# Patient Record
Sex: Female | Born: 1960 | Race: White | Hispanic: No | Marital: Married | Smoking: Never smoker
Health system: Southern US, Community
[De-identification: ages and names within clinical notes are randomized; demographics above are authoritative.]

## PROBLEM LIST (undated history)

## (undated) DIAGNOSIS — Z803 Family history of malignant neoplasm of breast: Secondary | ICD-10-CM

## (undated) DIAGNOSIS — K219 Gastro-esophageal reflux disease without esophagitis: Secondary | ICD-10-CM

## (undated) DIAGNOSIS — T4145XA Adverse effect of unspecified anesthetic, initial encounter: Secondary | ICD-10-CM

## (undated) DIAGNOSIS — Z9889 Other specified postprocedural states: Secondary | ICD-10-CM

## (undated) DIAGNOSIS — Z973 Presence of spectacles and contact lenses: Secondary | ICD-10-CM

## (undated) DIAGNOSIS — R112 Nausea with vomiting, unspecified: Secondary | ICD-10-CM

## (undated) DIAGNOSIS — M1990 Unspecified osteoarthritis, unspecified site: Secondary | ICD-10-CM

## (undated) DIAGNOSIS — K635 Polyp of colon: Secondary | ICD-10-CM

## (undated) DIAGNOSIS — K760 Fatty (change of) liver, not elsewhere classified: Secondary | ICD-10-CM

## (undated) DIAGNOSIS — I341 Nonrheumatic mitral (valve) prolapse: Secondary | ICD-10-CM

## (undated) DIAGNOSIS — T7840XA Allergy, unspecified, initial encounter: Secondary | ICD-10-CM

## (undated) DIAGNOSIS — J45909 Unspecified asthma, uncomplicated: Secondary | ICD-10-CM

## (undated) DIAGNOSIS — Z1371 Encounter for nonprocreative screening for genetic disease carrier status: Secondary | ICD-10-CM

## (undated) DIAGNOSIS — T8859XA Other complications of anesthesia, initial encounter: Secondary | ICD-10-CM

## (undated) DIAGNOSIS — Z9189 Other specified personal risk factors, not elsewhere classified: Secondary | ICD-10-CM

## (undated) DIAGNOSIS — IMO0002 Reserved for concepts with insufficient information to code with codable children: Secondary | ICD-10-CM

## (undated) DIAGNOSIS — B019 Varicella without complication: Secondary | ICD-10-CM

## (undated) DIAGNOSIS — N390 Urinary tract infection, site not specified: Secondary | ICD-10-CM

## (undated) DIAGNOSIS — B029 Zoster without complications: Secondary | ICD-10-CM

## (undated) DIAGNOSIS — Z8489 Family history of other specified conditions: Secondary | ICD-10-CM

## (undated) HISTORY — DX: Nonrheumatic mitral (valve) prolapse: I34.1

## (undated) HISTORY — DX: Allergy, unspecified, initial encounter: T78.40XA

## (undated) HISTORY — DX: Unspecified osteoarthritis, unspecified site: M19.90

## (undated) HISTORY — PX: OTHER SURGICAL HISTORY: SHX169

## (undated) HISTORY — DX: Unspecified asthma, uncomplicated: J45.909

## (undated) HISTORY — DX: Urinary tract infection, site not specified: N39.0

## (undated) HISTORY — PX: BREAST BIOPSY: SHX20

## (undated) HISTORY — DX: Reserved for concepts with insufficient information to code with codable children: IMO0002

## (undated) HISTORY — DX: Fatty (change of) liver, not elsewhere classified: K76.0

## (undated) HISTORY — DX: Family history of malignant neoplasm of breast: Z80.3

## (undated) HISTORY — PX: OOPHORECTOMY: SHX86

## (undated) HISTORY — DX: Zoster without complications: B02.9

## (undated) HISTORY — DX: Polyp of colon: K63.5

## (undated) HISTORY — DX: Varicella without complication: B01.9

## (undated) HISTORY — DX: Gastro-esophageal reflux disease without esophagitis: K21.9

---
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
## 1987-02-22 DIAGNOSIS — IMO0002 Reserved for concepts with insufficient information to code with codable children: Secondary | ICD-10-CM

## 1987-02-22 DIAGNOSIS — R87619 Unspecified abnormal cytological findings in specimens from cervix uteri: Secondary | ICD-10-CM

## 1987-02-22 HISTORY — DX: Unspecified abnormal cytological findings in specimens from cervix uteri: R87.619

## 1987-02-22 HISTORY — DX: Reserved for concepts with insufficient information to code with codable children: IMO0002

## 2004-02-05 ENCOUNTER — Ambulatory Visit: Payer: Self-pay | Admitting: Unknown Physician Specialty

## 2004-07-08 ENCOUNTER — Ambulatory Visit: Payer: Self-pay | Admitting: Unknown Physician Specialty

## 2004-07-21 ENCOUNTER — Ambulatory Visit: Payer: Self-pay | Admitting: Obstetrics and Gynecology

## 2004-07-22 ENCOUNTER — Ambulatory Visit: Payer: Self-pay | Admitting: Obstetrics and Gynecology

## 2004-08-02 ENCOUNTER — Ambulatory Visit: Payer: Self-pay | Admitting: Obstetrics and Gynecology

## 2004-09-27 ENCOUNTER — Ambulatory Visit: Payer: Self-pay | Admitting: Obstetrics and Gynecology

## 2005-01-28 ENCOUNTER — Ambulatory Visit: Payer: Self-pay | Admitting: Family Medicine

## 2005-02-15 ENCOUNTER — Ambulatory Visit: Payer: Self-pay | Admitting: Internal Medicine

## 2005-12-05 ENCOUNTER — Ambulatory Visit: Payer: Self-pay

## 2006-02-21 HISTORY — PX: ABDOMINAL HYSTERECTOMY: SHX81

## 2006-02-21 HISTORY — PX: TONSILLECTOMY AND ADENOIDECTOMY: SUR1326

## 2006-06-30 ENCOUNTER — Ambulatory Visit: Payer: Self-pay | Admitting: Unknown Physician Specialty

## 2006-07-08 ENCOUNTER — Ambulatory Visit: Payer: Self-pay | Admitting: Unknown Physician Specialty

## 2006-09-01 ENCOUNTER — Ambulatory Visit: Payer: Self-pay | Admitting: Unknown Physician Specialty

## 2006-09-05 ENCOUNTER — Ambulatory Visit: Payer: Self-pay | Admitting: Unknown Physician Specialty

## 2006-09-22 ENCOUNTER — Ambulatory Visit: Payer: Self-pay | Admitting: Oncology

## 2006-10-16 ENCOUNTER — Ambulatory Visit: Payer: Self-pay | Admitting: Oncology

## 2006-10-20 ENCOUNTER — Ambulatory Visit: Payer: Self-pay | Admitting: Unknown Physician Specialty

## 2006-10-23 ENCOUNTER — Ambulatory Visit: Payer: Self-pay | Admitting: Oncology

## 2006-12-28 ENCOUNTER — Ambulatory Visit: Payer: Self-pay

## 2008-03-05 ENCOUNTER — Ambulatory Visit: Payer: Self-pay | Admitting: Family Medicine

## 2008-03-11 ENCOUNTER — Ambulatory Visit: Payer: Self-pay | Admitting: Family Medicine

## 2008-04-03 ENCOUNTER — Ambulatory Visit: Payer: Self-pay | Admitting: Unknown Physician Specialty

## 2008-04-16 ENCOUNTER — Ambulatory Visit: Payer: Self-pay | Admitting: Family Medicine

## 2008-04-21 ENCOUNTER — Ambulatory Visit: Payer: Self-pay | Admitting: Family Medicine

## 2008-05-22 ENCOUNTER — Ambulatory Visit: Payer: Self-pay | Admitting: Family Medicine

## 2009-02-10 ENCOUNTER — Ambulatory Visit: Payer: Self-pay | Admitting: Unknown Physician Specialty

## 2009-03-11 ENCOUNTER — Ambulatory Visit: Payer: Self-pay | Admitting: Family Medicine

## 2009-03-20 ENCOUNTER — Ambulatory Visit: Payer: Self-pay | Admitting: Family Medicine

## 2009-05-21 ENCOUNTER — Ambulatory Visit: Payer: Self-pay | Admitting: Neurology

## 2009-06-13 ENCOUNTER — Ambulatory Visit: Payer: Self-pay | Admitting: Neurology

## 2009-06-23 ENCOUNTER — Ambulatory Visit: Payer: Self-pay | Admitting: Unknown Physician Specialty

## 2010-04-28 ENCOUNTER — Other Ambulatory Visit: Payer: Self-pay | Admitting: General Practice

## 2010-06-02 ENCOUNTER — Other Ambulatory Visit: Payer: Self-pay | Admitting: Unknown Physician Specialty

## 2010-06-02 ENCOUNTER — Ambulatory Visit: Payer: Self-pay | Admitting: Unknown Physician Specialty

## 2010-07-20 ENCOUNTER — Emergency Department: Payer: Self-pay | Admitting: Emergency Medicine

## 2010-07-23 ENCOUNTER — Ambulatory Visit: Payer: Self-pay | Admitting: Specialist

## 2010-08-24 ENCOUNTER — Encounter: Payer: Self-pay | Admitting: Orthopedic Surgery

## 2010-09-22 ENCOUNTER — Encounter: Payer: Self-pay | Admitting: Orthopedic Surgery

## 2010-10-23 ENCOUNTER — Encounter: Payer: Self-pay | Admitting: Orthopedic Surgery

## 2010-11-06 ENCOUNTER — Ambulatory Visit: Payer: Self-pay | Admitting: Orthopedic Surgery

## 2010-11-22 ENCOUNTER — Encounter: Payer: Self-pay | Admitting: Orthopedic Surgery

## 2010-12-23 ENCOUNTER — Encounter: Payer: Self-pay | Admitting: Orthopedic Surgery

## 2011-01-11 ENCOUNTER — Ambulatory Visit: Payer: Self-pay | Admitting: Anesthesiology

## 2011-01-22 ENCOUNTER — Encounter: Payer: Self-pay | Admitting: Orthopedic Surgery

## 2011-02-01 ENCOUNTER — Ambulatory Visit: Payer: Self-pay | Admitting: Orthopedic Surgery

## 2011-02-17 ENCOUNTER — Ambulatory Visit: Payer: Self-pay | Admitting: Unknown Physician Specialty

## 2011-02-20 LAB — HM PAP SMEAR: HM Pap smear: NORMAL

## 2011-02-22 ENCOUNTER — Encounter: Payer: Self-pay | Admitting: Orthopedic Surgery

## 2011-03-24 ENCOUNTER — Ambulatory Visit: Payer: Self-pay | Admitting: Unknown Physician Specialty

## 2011-06-03 ENCOUNTER — Ambulatory Visit: Payer: Self-pay | Admitting: Unknown Physician Specialty

## 2011-06-21 LAB — HM COLONOSCOPY

## 2011-06-22 HISTORY — PX: OTHER SURGICAL HISTORY: SHX169

## 2011-10-17 ENCOUNTER — Ambulatory Visit: Payer: Self-pay | Admitting: Internal Medicine

## 2011-10-21 ENCOUNTER — Ambulatory Visit: Payer: Self-pay | Admitting: Internal Medicine

## 2011-10-21 ENCOUNTER — Ambulatory Visit (INDEPENDENT_AMBULATORY_CARE_PROVIDER_SITE_OTHER): Payer: PRIVATE HEALTH INSURANCE | Admitting: Internal Medicine

## 2011-10-21 ENCOUNTER — Encounter: Payer: Self-pay | Admitting: Internal Medicine

## 2011-10-21 ENCOUNTER — Telehealth: Payer: Self-pay | Admitting: Internal Medicine

## 2011-10-21 VITALS — BP 122/82 | HR 73 | Temp 98.2°F | Ht 67.0 in | Wt 149.0 lb

## 2011-10-21 DIAGNOSIS — J302 Other seasonal allergic rhinitis: Secondary | ICD-10-CM | POA: Insufficient documentation

## 2011-10-21 DIAGNOSIS — R200 Anesthesia of skin: Secondary | ICD-10-CM

## 2011-10-21 DIAGNOSIS — R209 Unspecified disturbances of skin sensation: Secondary | ICD-10-CM

## 2011-10-21 DIAGNOSIS — J309 Allergic rhinitis, unspecified: Secondary | ICD-10-CM

## 2011-10-21 DIAGNOSIS — Z0000 Encounter for general adult medical examination without abnormal findings: Secondary | ICD-10-CM

## 2011-10-21 LAB — LIPID PANEL
Cholesterol: 178 mg/dL (ref 0–200)
HDL: 57 mg/dL (ref >39.00)
LDL Cholesterol: 102 mg/dL — ABNORMAL HIGH (ref 0–99)
Total CHOL/HDL Ratio: 3
Triglycerides: 94 mg/dL (ref 0.0–149.0)

## 2011-10-21 LAB — CBC WITH DIFFERENTIAL/PLATELET
Basophils Relative: 0.9 % (ref 0.0–3.0)
Eosinophils Relative: 4 % (ref 0.0–5.0)
Hemoglobin: 14.5 g/dL (ref 12.0–15.0)
Lymphocytes Relative: 27.7 % (ref 12.0–46.0)
Monocytes Relative: 9.1 % (ref 3.0–12.0)
Neutro Abs: 3.3 10*3/uL (ref 1.4–7.7)
Neutrophils Relative %: 58.3 % (ref 43.0–77.0)
RBC: 4.64 Mil/uL (ref 3.87–5.11)
WBC: 5.7 10*3/uL (ref 4.5–10.5)

## 2011-10-21 LAB — COMPREHENSIVE METABOLIC PANEL
ALT: 15 U/L (ref 0–35)
AST: 17 U/L (ref 0–37)
Alkaline Phosphatase: 83 U/L (ref 39–117)
Creatinine, Ser: 0.7 mg/dL (ref 0.4–1.2)
Sodium: 137 mEq/L (ref 135–145)
Total Bilirubin: 0.3 mg/dL (ref 0.3–1.2)
Total Protein: 7.3 g/dL (ref 6.0–8.3)

## 2011-10-21 MED ORDER — LORATADINE-PSEUDOEPHEDRINE ER 5-120 MG PO TB12: 1.0000 | ORAL_TABLET | Freq: Two times a day (BID) | ORAL | Status: AC

## 2011-10-21 NOTE — Telephone Encounter (Signed)
Patient notified

## 2011-10-21 NOTE — Progress Notes (Signed)
Subjective:    Patient ID: Ashley Contreras, female    DOB: 1961-02-01, 51 y.o.   MRN: [id number]  HPI 51 year old female presents to establish care. Her primary concern today is 2 episodes of left-sided facial numbness. She reports that these episodes both occurred approximately one month ago. She describes numbness in her left lower face extending from her cheek down to her jaw. The symptoms persisted for several hours and then subsided. There is no obvious trigger to the symptoms. There was no associated pain. She did not have weakness in her chart difficulty chewing or swallowing. She did not have any other focal neurologic symptoms. She denies headache, fever, chills, neck stiffness. She was not taking any new medications except for occasional oxycodone postoperatively after her foot surgery.  She notes that she recent had foot surgery to repair a calcaneal fracture in her right foot which she sustained after a fall from a ladder. She reports significant pain postoperatively. She is currently using nonsteroidals as needed for pain. She is trying to adapt to a regular tennis shoe but having some difficulty with ambulation. She continues to follow with her surgeon at [facility]. Next  She also notes a history of seasonal allergies for which she takes Claritin-D. She reports improvement in her symptoms of nasal drainage and bilateral ear pressure and itching with use of medication.  Outpatient Encounter Prescriptions as of 10/21/2011  Medication Sig Dispense Refill  . acetaminophen (TYLENOL) 500 MG tablet Take 500 mg by mouth every 4 (four) hours as needed.      . Calcium Carbonate-Vitamin D (CALCIUM-VITAMIN D) 500-200 MG-UNIT per tablet Take 1 tablet by mouth 2 (two) times daily with a meal.      . estradiol (VIVELLE-DOT) 0.05 MG/24HR Place 1 patch onto the skin 2 (two) times a week.      . loratadine-pseudoephedrine (CLARITIN-D 12-HOUR) 5-120 MG per tablet Take 1 tablet by mouth 2 (two) times  daily.  30 tablet  3  . ranitidine (ZANTAC) 150 MG tablet Take 150 mg by mouth 2 (two) times daily.      Marland Kitchen DISCONTD: loratadine-pseudoephedrine (CLARITIN-D 24-HOUR) 10-240 MG per 24 hr tablet Take 1 tablet by mouth daily.        Review of Systems  Constitutional: Negative for fever, chills, appetite change, fatigue and unexpected weight change.  HENT: Positive for congestion. Negative for ear pain, sore throat, trouble swallowing, neck pain, voice change and sinus pressure.   Eyes: Negative for visual disturbance.  Respiratory: Negative for cough, shortness of breath, wheezing and stridor.   Cardiovascular: Negative for chest pain, palpitations and leg swelling.  Gastrointestinal: Negative for nausea, vomiting, abdominal pain, diarrhea, constipation, blood in stool, abdominal distention and anal bleeding.  Genitourinary: Negative for dysuria and flank pain.  Musculoskeletal: Negative for myalgias, arthralgias and gait problem.  Skin: Negative for color change and rash.  Neurological: Positive for numbness. Negative for dizziness, tremors, seizures, syncope, speech difficulty, weakness, light-headedness and headaches.  Hematological: Negative for adenopathy. Does not bruise/bleed easily.  Psychiatric/Behavioral: Negative for suicidal ideas, disturbed wake/sleep cycle and dysphoric mood. The patient is not nervous/anxious.        Objective:   Physical Exam  Constitutional: She is oriented to person, place, and time. She appears well-developed and well-nourished. No distress.  HENT:  Head: Normocephalic and atraumatic.  Right Ear: External ear normal.  Left Ear: External ear normal.  Nose: Nose normal.  Mouth/Throat: Oropharynx is clear and moist. No oropharyngeal exudate.  Eyes: Conjunctivae  are normal. Pupils are equal, round, and reactive to light. Right eye exhibits no discharge. Left eye exhibits no discharge. No scleral icterus.  Neck: Normal range of motion. Neck supple. No  tracheal tenderness present. Carotid bruit is not present. No tracheal deviation, no edema and no erythema present. No mass and no thyromegaly present.  Cardiovascular: Normal rate, regular rhythm, normal heart sounds and intact distal pulses.  Exam reveals no gallop and no friction rub.   No murmur heard. Pulmonary/Chest: Effort normal and breath sounds normal. No respiratory distress. She has no wheezes. She has no rales. She exhibits no tenderness.  Abdominal: Soft. Bowel sounds are normal. She exhibits no distension. There is no tenderness.  Musculoskeletal: Normal range of motion. She exhibits no edema and no tenderness.  Lymphadenopathy:    She has no cervical adenopathy.  Neurological: She is alert and oriented to person, place, and time. No cranial nerve deficit. She exhibits normal muscle tone. Coordination normal.  Skin: Skin is warm and dry. No rash noted. She is not diaphoretic. No erythema. No pallor.  Psychiatric: She has a normal mood and affect. Her behavior is normal. Judgment and thought content normal.          Assessment & Plan:

## 2011-10-21 NOTE — Telephone Encounter (Signed)
CT head was normal

## 2011-10-21 NOTE — Assessment & Plan Note (Addendum)
Unclear etiology of intermittent left facial numbness. No symptoms to suggest trigeminal neuralgia or Bell's palsy. Patient is very concerned about possibility of TIA or stroke. Based on history, her risk of stroke would be low with no history of hyperlipidemia, smoking, or family history of stroke. However, given persistent symptoms, will proceed with further evaluation including CT of the brain. Will also check a lipid profile with labs today. Followup 2 weeks. She will call sooner if any recurrent symptoms.

## 2011-10-21 NOTE — Assessment & Plan Note (Signed)
Symptoms well controlled with Claritin-D. Will continue.

## 2011-11-01 ENCOUNTER — Encounter: Payer: Self-pay | Admitting: Internal Medicine

## 2011-11-03 ENCOUNTER — Ambulatory Visit (INDEPENDENT_AMBULATORY_CARE_PROVIDER_SITE_OTHER): Payer: PRIVATE HEALTH INSURANCE | Admitting: Internal Medicine

## 2011-11-03 ENCOUNTER — Encounter: Payer: Self-pay | Admitting: Internal Medicine

## 2011-11-03 VITALS — BP 102/70 | HR 90 | Temp 98.0°F | Ht 67.0 in | Wt 149.8 lb

## 2011-11-03 DIAGNOSIS — M5412 Radiculopathy, cervical region: Secondary | ICD-10-CM

## 2011-11-03 DIAGNOSIS — R209 Unspecified disturbances of skin sensation: Secondary | ICD-10-CM

## 2011-11-03 DIAGNOSIS — R200 Anesthesia of skin: Secondary | ICD-10-CM

## 2011-11-03 NOTE — Assessment & Plan Note (Signed)
Symptoms have completely resolved. CT brain was normal. Will continue to monitor. If any recurrent symptoms, would favor neurology evaluation.

## 2011-11-03 NOTE — Assessment & Plan Note (Signed)
Symptoms are consistent with cervical radiculopathy. Given that her last MRI was several years ago and symptoms are worsening, will plan to repeat MRI of the cervical spine. We discussed potential referral to neurosurgery pending results.

## 2011-11-03 NOTE — Progress Notes (Signed)
Subjective:    Patient ID: Ashley Contreras, female    DOB: 12-17-1960, 51 y.o.   MRN: [id number]  HPI 51 year old female presents for followup. At her previous visit she was evaluated for episodes of tingling on the left side of her face. She ultimately underwent CT of the head which was normal. She reports no further recurrence of these episodes.  She is primarily concerned today about two-year history of intermittent pain in her cervical spine which occasionally radiates down her left arm. She notes she was evaluated for this before with plain x-ray and then with MRI which showed degenerative changes and bulging disc at C3 and C4. She ultimately underwent conservative management. Symptoms improved however recently have seemed to worsen with more frequent pain radiating down her left arm. She denies any weakness in her arm or numbness in her arm. She denies any other focal symptoms. She has not taken any medication for this.  Outpatient Encounter Prescriptions as of 11/03/2011  Medication Sig Dispense Refill  . acetaminophen (TYLENOL) 500 MG tablet Take 500 mg by mouth every 4 (four) hours as needed.      . Calcium Carbonate-Vitamin D (CALCIUM-VITAMIN D) 500-200 MG-UNIT per tablet Take 1 tablet by mouth 2 (two) times daily with a meal.      . estradiol (VIVELLE-DOT) 0.05 MG/24HR Place 1 patch onto the skin 2 (two) times a week.      . loratadine-pseudoephedrine (CLARITIN-D 12-HOUR) 5-120 MG per tablet Take 1 tablet by mouth 2 (two) times daily.  30 tablet  3  . ranitidine (ZANTAC) 150 MG tablet Take 150 mg by mouth 2 (two) times daily.       BP 102/70  Pulse 90  Temp 98 F (36.7 C) (Oral)  Ht 5\' 7"  (1.702 m)  Wt 149 lb 12 oz (67.926 kg)  BMI 23.45 kg/m2  SpO2 99%  Review of Systems  Constitutional: Negative for fever, chills, appetite change, fatigue and unexpected weight change.  HENT: Negative for ear pain, congestion, sore throat, trouble swallowing, neck pain, voice change and sinus  pressure.   Eyes: Negative for visual disturbance.  Respiratory: Negative for cough, shortness of breath, wheezing and stridor.   Cardiovascular: Negative for chest pain, palpitations and leg swelling.  Gastrointestinal: Negative for nausea, vomiting, abdominal pain, diarrhea, constipation, blood in stool, abdominal distention and anal bleeding.  Genitourinary: Negative for dysuria and flank pain.  Musculoskeletal: Positive for myalgias and back pain. Negative for arthralgias and gait problem.  Skin: Negative for color change and rash.  Neurological: Negative for dizziness and headaches.  Hematological: Negative for adenopathy. Does not bruise/bleed easily.  Psychiatric/Behavioral: Negative for suicidal ideas, disturbed wake/sleep cycle and dysphoric mood. The patient is not nervous/anxious.        Objective:   Physical Exam  Constitutional: She is oriented to person, place, and time. She appears well-developed and well-nourished. No distress.  HENT:  Head: Normocephalic and atraumatic.  Right Ear: External ear normal.  Left Ear: External ear normal.  Nose: Nose normal.  Mouth/Throat: Oropharynx is clear and moist. No oropharyngeal exudate.  Eyes: Conjunctivae normal are normal. Pupils are equal, round, and reactive to light. Right eye exhibits no discharge. Left eye exhibits no discharge. No scleral icterus.  Neck: Normal range of motion. Neck supple. No tracheal deviation present. No thyromegaly present.  Cardiovascular: Normal rate, regular rhythm, normal heart sounds and intact distal pulses.  Exam reveals no gallop and no friction rub.   No murmur heard. Pulmonary/Chest: Effort normal  and breath sounds normal. No respiratory distress. She has no wheezes. She has no rales. She exhibits no tenderness.  Musculoskeletal: Normal range of motion. She exhibits no edema and no tenderness.  Lymphadenopathy:    She has no cervical adenopathy.  Neurological: She is alert and oriented to  person, place, and time. No cranial nerve deficit. She exhibits normal muscle tone. Coordination normal.  Skin: Skin is warm and dry. No rash noted. She is not diaphoretic. No erythema. No pallor.  Psychiatric: She has a normal mood and affect. Her behavior is normal. Judgment and thought content normal.          Assessment & Plan:

## 2011-12-05 ENCOUNTER — Ambulatory Visit: Payer: PRIVATE HEALTH INSURANCE | Admitting: Internal Medicine

## 2011-12-12 ENCOUNTER — Encounter: Payer: Self-pay | Admitting: Internal Medicine

## 2012-02-02 LAB — HM PAP SMEAR: HM Pap smear: NORMAL

## 2012-04-25 ENCOUNTER — Encounter: Payer: Self-pay | Admitting: Adult Health

## 2012-04-25 ENCOUNTER — Ambulatory Visit (INDEPENDENT_AMBULATORY_CARE_PROVIDER_SITE_OTHER): Payer: 59 | Admitting: Adult Health

## 2012-04-25 ENCOUNTER — Telehealth: Payer: Self-pay | Admitting: *Deleted

## 2012-04-25 VITALS — BP 115/82 | HR 96 | Temp 99.0°F | Resp 14 | Ht 67.0 in | Wt 142.0 lb

## 2012-04-25 DIAGNOSIS — J329 Chronic sinusitis, unspecified: Secondary | ICD-10-CM | POA: Insufficient documentation

## 2012-04-25 DIAGNOSIS — L293 Anogenital pruritus, unspecified: Secondary | ICD-10-CM

## 2012-04-25 DIAGNOSIS — Z1239 Encounter for other screening for malignant neoplasm of breast: Secondary | ICD-10-CM

## 2012-04-25 DIAGNOSIS — N898 Other specified noninflammatory disorders of vagina: Secondary | ICD-10-CM

## 2012-04-25 DIAGNOSIS — IMO0002 Reserved for concepts with insufficient information to code with codable children: Secondary | ICD-10-CM | POA: Insufficient documentation

## 2012-04-25 MED ORDER — FLUCONAZOLE 150 MG PO TABS: 150.0000 mg | ORAL_TABLET | Freq: Once | ORAL | Status: DC

## 2012-04-25 MED ORDER — FLUTICASONE PROPIONATE 50 MCG/ACT NA SUSP: 2.0000 | Freq: Every day | NASAL | Status: DC

## 2012-04-25 MED ORDER — AZITHROMYCIN 250 MG PO TABS: ORAL_TABLET | ORAL | Status: DC

## 2012-04-25 MED ORDER — VALACYCLOVIR HCL 1 G PO TABS: ORAL_TABLET | ORAL | Status: DC

## 2012-04-25 MED ORDER — METRONIDAZOLE 500 MG PO TABS: ORAL_TABLET | ORAL | Status: DC

## 2012-04-25 NOTE — Telephone Encounter (Signed)
For some reason it wont let me put in the vaginal culture order, can you try? Thank you

## 2012-04-25 NOTE — Assessment & Plan Note (Addendum)
Patient was interested in information regarding Osphena. She had seen an advertisement on television regarding this medication and its use for dyspareunia. This medication is not recommended to be used with other estrogen products. It is also not recommended to be used in women who have hot flashes and vaginal dryness. Osphena can cause hot flashes. She reports that her menopausal symptoms are severe and she wants to remain on the estrogen patch. Discussed the use of Vagifem vaginal tablets. She was agreeable to try this. Samples provided. Patient will advise if she would like a prescription for this. Note, greater than 45 minutes was spent in face to face communication with patient in counseling, discussing options, evaluation & management of dyspareunia.

## 2012-04-25 NOTE — Progress Notes (Signed)
Subjective:    Patient ID: Ashley Contreras, female    DOB: 02-19-1961, 52 y.o.   MRN: [id number]  HPI  Patient is a pleasant 52 year old female who presents to clinic today with the following concerns:  1) Feeling achy, chilled. She has felt feverish. Her symptoms started on Monday. "I feel like my whole head is stopped up". She has tried claritin D without symptom relief. She took a benadryl last night to help her sleep. She has green drainage from her nose. She is also having post nasal drip which is irritating her throat. Denies cough, shortness of breath, wheezing, cp.  2) Also, patient has been having burning and itching in and around the vagina. She also has dyspareunia secondary to severe dryness. She uses K-Y jelly lubricant which helps somewhat. The symptoms have been worse within the last year. She is s/p total hysterectomy and bilateral salpingo-oopherectomy 5 years ago. She is currently on an estrogen patch twice a week.  She has a strong family hx of breast cancer. She reports last mammogram was January 2013.   Current Outpatient Prescriptions on File Prior to Visit  Medication Sig Dispense Refill  . acetaminophen (TYLENOL) 500 MG tablet Take 500 mg by mouth every 4 (four) hours as needed.      . Calcium Carbonate-Vitamin D (CALCIUM-VITAMIN D) 500-200 MG-UNIT per tablet Take 1 tablet by mouth 2 (two) times daily with a meal.      . estradiol (VIVELLE-DOT) 0.05 MG/24HR Place 1 patch onto the skin 2 (two) times a week.      . loratadine-pseudoephedrine (CLARITIN-D 12-HOUR) 5-120 MG per tablet Take 1 tablet by mouth 2 (two) times daily.  30 tablet  3  . ranitidine (ZANTAC) 150 MG tablet Take 150 mg by mouth 2 (two) times daily.       No current facility-administered medications on file prior to visit.     Review of Systems  Constitutional: Positive for fever and chills.  HENT: Positive for congestion, rhinorrhea, postnasal drip and sinus pressure.   Eyes: Negative.   Respiratory:  Negative for cough, shortness of breath and wheezing.   Cardiovascular: Negative for chest pain and palpitations.  Gastrointestinal: Negative for nausea and vomiting.  Genitourinary: Positive for dyspareunia. Negative for dysuria, difficulty urinating and genital sores.  Neurological: Negative for dizziness, light-headedness and headaches.  Psychiatric/Behavioral: Negative for behavioral problems. The patient is not nervous/anxious.     BP 115/82  Pulse 96  Temp(Src) 99 F (37.2 C) (Oral)  Resp 14  Ht 5\' 7"  (1.702 m)  Wt 142 lb (64.411 kg)  BMI 22.24 kg/m2  SpO2 99%     Objective:   Physical Exam  Constitutional: She is oriented to person, place, and time. She appears well-developed and well-nourished. No distress.  HENT:  Head: Normocephalic and atraumatic.  Cardiovascular: Normal rate and regular rhythm.   Pulmonary/Chest: Effort normal and breath sounds normal. No respiratory distress.  Genitourinary:    No labial fusion. There is no rash, tenderness, lesion or injury on the right labia. There is no rash, tenderness, lesion or injury on the left labia. No tenderness around the vagina. No signs of injury around the vagina. Vaginal discharge found.    Lymphadenopathy:    She has no cervical adenopathy.  Neurological: She is alert and oriented to person, place, and time.  Skin: No rash noted. No erythema.  Psychiatric: She has a normal mood and affect. Her behavior is normal. Judgment and thought content normal.  Assessment & Plan:

## 2012-04-25 NOTE — Telephone Encounter (Signed)
I saw the order was in - were you able to get some help with this? Sorry, I just saw the message.

## 2012-04-25 NOTE — Assessment & Plan Note (Addendum)
Suspect bacterial origin. Will start patient on azithromycin. I have also given her a prescription for Diflucan in the event that she develops a yeast infection secondary to the antibiotic use. I have asked her to return to clinic if her symptoms do not improve within 3-4 days.

## 2012-04-25 NOTE — Patient Instructions (Addendum)
  Please start the Azithromycin today as instructed.  I have also ordered Flonase nasal spray. Use two sprays in each nostril daily for your sinuses and allergies.  You will also start the Metronidazole - take 1 tablet twice a day for 7 days. This is the medication that combined with alcohol can make you sick.  I have given you samples for Vagifem. Insert 1 tablet vaginally daily for 2 weeks then you will only insert twice a week.

## 2012-04-26 DIAGNOSIS — N898 Other specified noninflammatory disorders of vagina: Secondary | ICD-10-CM | POA: Insufficient documentation

## 2012-04-26 NOTE — Assessment & Plan Note (Signed)
Vaginal culture obtained and sent for evaluation. Started patient on metronidazole for treatment of BV.

## 2012-05-18 ENCOUNTER — Emergency Department: Payer: Self-pay | Admitting: Emergency Medicine

## 2012-05-30 ENCOUNTER — Ambulatory Visit: Payer: Self-pay | Admitting: Internal Medicine

## 2012-06-21 ENCOUNTER — Encounter: Payer: Self-pay | Admitting: Internal Medicine

## 2012-07-20 ENCOUNTER — Ambulatory Visit: Payer: Self-pay | Admitting: Orthopaedic Surgery

## 2012-09-18 ENCOUNTER — Ambulatory Visit: Payer: 59 | Admitting: Internal Medicine

## 2012-09-18 ENCOUNTER — Emergency Department: Payer: Self-pay | Admitting: Internal Medicine

## 2012-09-18 LAB — URINALYSIS, COMPLETE
Bacteria: NONE SEEN
Glucose,UR: NEGATIVE mg/dL (ref 0–75)
Ketone: NEGATIVE
Nitrite: NEGATIVE
Protein: NEGATIVE
RBC,UR: 2 /HPF (ref 0–5)
Specific Gravity: 1.017 (ref 1.003–1.030)
Squamous Epithelial: 2

## 2012-09-18 LAB — CBC
HGB: 13.6 g/dL (ref 12.0–16.0)
MCH: 32.2 pg (ref 26.0–34.0)
MCHC: 34.1 g/dL (ref 32.0–36.0)
MCV: 95 fL (ref 80–100)
Platelet: 205 10*3/uL (ref 150–440)
RBC: 4.22 10*6/uL (ref 3.80–5.20)
RDW: 12.6 % (ref 11.5–14.5)
WBC: 5.2 10*3/uL (ref 3.6–11.0)

## 2012-09-18 LAB — CK TOTAL AND CKMB (NOT AT ARMC): CK-MB: 0.8 ng/mL (ref 0.5–3.6)

## 2012-09-18 LAB — COMPREHENSIVE METABOLIC PANEL
Albumin: 3.6 g/dL (ref 3.4–5.0)
BUN: 12 mg/dL (ref 7–18)
Bilirubin,Total: 0.3 mg/dL (ref 0.2–1.0)
Chloride: 106 mmol/L (ref 98–107)
Creatinine: 0.6 mg/dL (ref 0.60–1.30)
EGFR (African American): >60
Glucose: 93 mg/dL (ref 65–99)
Potassium: 4.2 mmol/L (ref 3.5–5.1)
SGOT(AST): 16 U/L (ref 15–37)
SGPT (ALT): 17 U/L (ref 12–78)
Sodium: 140 mmol/L (ref 136–145)
Total Protein: 7 g/dL (ref 6.4–8.2)

## 2012-09-18 LAB — TROPONIN I: Troponin-I: <0.02 ng/mL

## 2012-09-18 LAB — PROTIME-INR: Prothrombin Time: 13 secs (ref 11.5–14.7)

## 2012-09-21 ENCOUNTER — Encounter: Payer: Self-pay | Admitting: Internal Medicine

## 2012-09-21 ENCOUNTER — Ambulatory Visit (INDEPENDENT_AMBULATORY_CARE_PROVIDER_SITE_OTHER): Payer: 59 | Admitting: Internal Medicine

## 2012-09-21 VITALS — BP 104/68 | HR 84 | Temp 98.5°F | Wt 138.0 lb

## 2012-09-21 DIAGNOSIS — K2940 Chronic atrophic gastritis without bleeding: Secondary | ICD-10-CM

## 2012-09-21 DIAGNOSIS — R200 Anesthesia of skin: Secondary | ICD-10-CM

## 2012-09-21 DIAGNOSIS — R209 Unspecified disturbances of skin sensation: Secondary | ICD-10-CM

## 2012-09-21 DIAGNOSIS — K2950 Unspecified chronic gastritis without bleeding: Secondary | ICD-10-CM | POA: Insufficient documentation

## 2012-09-21 DIAGNOSIS — K219 Gastro-esophageal reflux disease without esophagitis: Secondary | ICD-10-CM | POA: Insufficient documentation

## 2012-09-21 LAB — LIPID PANEL
HDL: 59 mg/dL (ref >39)
LDL Cholesterol: 91 mg/dL (ref 0–99)
Total CHOL/HDL Ratio: 2.9 Ratio
Triglycerides: 108 mg/dL (ref <150)
VLDL: 22 mg/dL (ref 0–40)

## 2012-09-21 LAB — CBC WITH DIFFERENTIAL/PLATELET
Basophils Absolute: 0 10*3/uL (ref 0.0–0.1)
Basophils Relative: 1 % (ref 0–1)
Eosinophils Relative: 2 % (ref 0–5)
HCT: 40.9 % (ref 36.0–46.0)
Hemoglobin: 13.8 g/dL (ref 12.0–15.0)
Lymphocytes Relative: 24 % (ref 12–46)
MCHC: 33.7 g/dL (ref 30.0–36.0)
MCV: 94 fL (ref 78.0–100.0)
Monocytes Absolute: 0.5 10*3/uL (ref 0.1–1.0)
Monocytes Relative: 11 % (ref 3–12)
RDW: 13.4 % (ref 11.5–15.5)

## 2012-09-21 LAB — COMPREHENSIVE METABOLIC PANEL
ALT: 12 U/L (ref 0–35)
AST: 16 U/L (ref 0–37)
Creat: 0.78 mg/dL (ref 0.50–1.10)
Total Bilirubin: 0.3 mg/dL (ref 0.3–1.2)

## 2012-09-21 MED ORDER — PANTOPRAZOLE SODIUM 40 MG PO TBEC: 40.0000 mg | DELAYED_RELEASE_TABLET | Freq: Every day | ORAL | Status: DC

## 2012-09-21 NOTE — Assessment & Plan Note (Signed)
Previous diagnosis of chronic gastritis. Now symptomatic with abdominal fullness and pain. Will change PPI to Pantoprazole. Will set up GI evaluation. Question if UGI or repeat EGD might be helpful.

## 2012-09-21 NOTE — Assessment & Plan Note (Signed)
Recurrent symptoms of left facial tingling. Exam is normal including sensation to monofilament and temperature. Question if this may be a form of mild trigeminal neuralgia. Will set up MRI brain and neurology evaluation. Will check TSH, CBC, B12 with labs today.

## 2012-09-21 NOTE — Progress Notes (Signed)
Subjective:    Patient ID: Ashley Contreras, female    DOB: 25-May-1960, 52 y.o.   MRN: [id number]  HPI 52YO female with h/o intermittent left facial numbness and tingling presents for follow up. Over the last several weeks, she has had recurrent, intermittent tingling in her left lower face. She also occasionally has tingling her her bilateral 2nd and 3rd fingers. No visual changes, headaches, dizziness. Symptoms come and go without obvious trigger. Previous CT head in 2013 was normal. Previous MRI cervical spine showed spondylosis, but no lesions amenable to intervention. She has strong family h/o MS in her sister and niece.  She is also concerned about persistent symptoms of abdominal pain and fullness. Previous EGD in 2013 showed "chronic gastritis" by her report. Symptoms initially improved with omeprazole, however over last several months have worsened. No nausea, vomiting. No change in bowel habits. No change in appetite.  Outpatient Encounter Prescriptions as of 09/21/2012  Medication Sig Dispense Refill  . acetaminophen (TYLENOL) 500 MG tablet Take 500 mg by mouth every 4 (four) hours as needed.      . Calcium Carbonate-Vitamin D (CALCIUM-VITAMIN D) 500-200 MG-UNIT per tablet Take 1 tablet by mouth 2 (two) times daily with a meal.      . estradiol (VIVELLE-DOT) 0.05 MG/24HR Place 1 patch onto the skin 2 (two) times a week.      . loratadine-pseudoephedrine (CLARITIN-D 12-HOUR) 5-120 MG per tablet Take 1 tablet by mouth 2 (two) times daily.  30 tablet  3  . valACYclovir (VALTREX) 1000 MG tablet Take 2 tablets at the onset of a fever blister and then take 2 tablets 12 hours later.  30 tablet  6  . fluticasone (FLONASE) 50 MCG/ACT nasal spray Place 2 sprays into the nose daily.  16 g  6  . pantoprazole (PROTONIX) 40 MG tablet Take 1 tablet (40 mg total) by mouth daily.  30 tablet  3  . ranitidine (ZANTAC) 150 MG tablet Take 150 mg by mouth 2 (two) times daily.       No facility-administered  encounter medications on file as of 09/21/2012.   BP 104/68  Pulse 84  Temp(Src) 98.5 F (36.9 C) (Oral)  Wt 138 lb (62.596 kg)  BMI 21.61 kg/m2  SpO2 98%  Review of Systems  Constitutional: Negative for fever, chills, appetite change, fatigue and unexpected weight change.  HENT: Negative for ear pain, congestion, sore throat, trouble swallowing, neck pain, voice change and sinus pressure.   Eyes: Negative for visual disturbance.  Respiratory: Negative for cough, shortness of breath, wheezing and stridor.   Cardiovascular: Negative for chest pain, palpitations and leg swelling.  Gastrointestinal: Positive for abdominal pain. Negative for nausea, vomiting, diarrhea, constipation, blood in stool, abdominal distention and anal bleeding.  Genitourinary: Negative for dysuria and flank pain.  Musculoskeletal: Negative for myalgias, arthralgias and gait problem.  Skin: Negative for color change and rash.  Neurological: Positive for numbness. Negative for dizziness, tremors, seizures, syncope, speech difficulty, weakness and headaches.  Hematological: Negative for adenopathy. Does not bruise/bleed easily.  Psychiatric/Behavioral: Negative for suicidal ideas, sleep disturbance and dysphoric mood. The patient is not nervous/anxious.        Objective:   Physical Exam  Constitutional: She is oriented to person, place, and time. She appears well-developed and well-nourished. No distress.  HENT:  Head: Normocephalic and atraumatic.  Right Ear: External ear normal.  Left Ear: External ear normal.  Nose: Nose normal.  Mouth/Throat: Oropharynx is clear and moist. No oropharyngeal exudate.  Eyes: Conjunctivae are normal. Pupils are equal, round, and reactive to light. Right eye exhibits no discharge. Left eye exhibits no discharge. No scleral icterus.  Neck: Normal range of motion. Neck supple. No tracheal deviation present. No thyromegaly present.  Cardiovascular: Normal rate, regular rhythm,  normal heart sounds and intact distal pulses.  Exam reveals no gallop and no friction rub.   No murmur heard. Pulmonary/Chest: Effort normal and breath sounds normal. No accessory muscle usage. Not tachypneic. No respiratory distress. She has no decreased breath sounds. She has no wheezes. She has no rhonchi. She has no rales. She exhibits no tenderness.  Musculoskeletal: Normal range of motion. She exhibits no edema and no tenderness.  Lymphadenopathy:    She has no cervical adenopathy.  Neurological: She is alert and oriented to person, place, and time. She displays no atrophy and no tremor. No cranial nerve deficit or sensory deficit. She exhibits normal muscle tone. Coordination and gait normal.  Skin: Skin is warm and dry. No rash noted. She is not diaphoretic. No erythema. No pallor.  Psychiatric: Her speech is normal and behavior is normal. Judgment and thought content normal. Her mood appears anxious.          Assessment & Plan:

## 2012-09-24 ENCOUNTER — Encounter: Payer: Self-pay | Admitting: Emergency Medicine

## 2012-09-26 ENCOUNTER — Encounter: Payer: Self-pay | Admitting: Gastroenterology

## 2012-09-26 ENCOUNTER — Encounter: Payer: Self-pay | Admitting: Emergency Medicine

## 2012-09-29 ENCOUNTER — Ambulatory Visit: Payer: Self-pay | Admitting: Internal Medicine

## 2012-10-01 ENCOUNTER — Telehealth: Payer: Self-pay | Admitting: Internal Medicine

## 2012-10-01 NOTE — Telephone Encounter (Signed)
Patient informed and verbalized understanding

## 2012-10-01 NOTE — Telephone Encounter (Signed)
Left message to call back  

## 2012-10-01 NOTE — Telephone Encounter (Signed)
MRI of the brain was normal.

## 2012-10-05 ENCOUNTER — Encounter: Payer: Self-pay | Admitting: Internal Medicine

## 2012-10-19 ENCOUNTER — Ambulatory Visit: Payer: Self-pay | Admitting: Neurology

## 2012-10-25 ENCOUNTER — Encounter: Payer: 59 | Admitting: Internal Medicine

## 2012-10-29 ENCOUNTER — Ambulatory Visit (INDEPENDENT_AMBULATORY_CARE_PROVIDER_SITE_OTHER): Payer: 59 | Admitting: Gastroenterology

## 2012-10-29 ENCOUNTER — Encounter: Payer: Self-pay | Admitting: Gastroenterology

## 2012-10-29 VITALS — BP 100/62 | HR 80 | Ht 67.0 in | Wt 140.4 lb

## 2012-10-29 DIAGNOSIS — K219 Gastro-esophageal reflux disease without esophagitis: Secondary | ICD-10-CM

## 2012-10-29 DIAGNOSIS — Z860100 Personal history of colon polyps, unspecified: Secondary | ICD-10-CM | POA: Insufficient documentation

## 2012-10-29 DIAGNOSIS — K3189 Other diseases of stomach and duodenum: Secondary | ICD-10-CM

## 2012-10-29 DIAGNOSIS — R1013 Epigastric pain: Secondary | ICD-10-CM | POA: Insufficient documentation

## 2012-10-29 MED ORDER — HYOSCYAMINE SULFATE 0.125 MG SL SUBL: 0.2500 mg | SUBLINGUAL_TABLET | SUBLINGUAL | Status: DC | PRN

## 2012-10-29 NOTE — Assessment & Plan Note (Signed)
The patient has mild nonspecific dyspepsia characterized by bloating, mild abdominal discomfort with occasional diarrhea.  There is no specific pattern to this.  Recommendations #1 hyomax.

## 2012-10-29 NOTE — Progress Notes (Signed)
History of Present Illness: Pleasant 52 year old white female referred for evaluation of reflux.  She complains of intermittent pyrosis which is fairly well-controlled with proton X.  She has bouts of nausea and occasionally feels a bloated and distended.  If she bends over she may feel some pressure in her upper abdomen which is partially relieved with standing erect.  She denies dysphagia.  Reflux esophagitis and nonspecific gastritis were diagnosed in April, 2013 by upper endoscopy at [facility].  Colonoscopy in 2013 apparently demonstrated precancerous polyps.  She is on no gastric irritants including nonsteroidals.  She moves her bowels regularly.  Weight has been stable.    Past Medical History  Diagnosis Date  . Asthma   . Arthritis   . Chicken pox   . GERD (gastroesophageal reflux disease)   . Allergy     hay fever  . Colon polyp   . UTI (lower urinary tract infection)   . Mitral valve prolapse    Past Surgical History  Procedure Laterality Date  . Abdominal hysterectomy  2008  . Tonsillectomy and adenoidectomy  2008  . Calcaneous osteotomy  06/2011   family history includes Arthritis in her mother; Cancer in her father, maternal aunt, and sister; Cancer (age of onset: 2) in her mother; Diabetes in her maternal grandmother and mother; Heart disease in her father; Hyperlipidemia in her father; Hypertension in her maternal grandmother and mother; Mental illness in her mother and sister; Multiple sclerosis in her other and sister; Myasthenia gravis in her sister; Stroke in her maternal aunt and maternal grandmother. Current Outpatient Prescriptions  Medication Sig Dispense Refill  . acetaminophen (TYLENOL) 500 MG tablet Take 500 mg by mouth every 4 (four) hours as needed.      . Calcium Carbonate-Vitamin D (CALCIUM-VITAMIN D) 500-200 MG-UNIT per tablet Take 1 tablet by mouth 2 (two) times daily with a meal.      . estradiol (VIVELLE-DOT) 0.05 MG/24HR Place 1 patch  onto the skin 2 (two) times a week.      . pantoprazole (PROTONIX) 40 MG tablet Take 1 tablet (40 mg total) by mouth daily.  30 tablet  3  . valACYclovir (VALTREX) 1000 MG tablet Take 2 tablets at the onset of a fever blister and then take 2 tablets 12 hours later.  30 tablet  6   No current facility-administered medications for this visit.   Allergies as of 10/29/2012 - Review Complete 10/29/2012  Allergen Reaction Noted  . Penicillins Swelling 10/21/2011    reports that she has never smoked. She has never used smokeless tobacco. She reports that she does not drink alcohol or use illicit drugs.     Review of Systems: Pertinent positive and negative review of systems were noted in the above HPI section. All other review of systems were otherwise negative.  Vital signs were reviewed in today's medical record Physical Exam: General: Well developed , well nourished, no acute distress Skin: anicteric Head: Normocephalic and atraumatic Eyes:  sclerae anicteric, EOMI Ears: Normal auditory acuity Mouth: No deformity or lesions Neck: Supple, no masses or thyromegaly Lungs: Clear throughout to auscultation Heart: Regular rate and rhythm; no murmurs, rubs or bruits Abdomen: Soft, non tender and non distended. No masses, hepatosplenomegaly or hernias noted. Normal Bowel sounds Rectal:deferred Musculoskeletal: Symmetrical with no gross deformities  Skin: No lesions on visible extremities Pulses:  Normal pulses noted Extremities: No clubbing, cyanosis, edema or deformities noted Neurological: Alert oriented x 4, grossly nonfocal Cervical Nodes:  No significant  cervical adenopathy Inguinal Nodes: No significant inguinal adenopathy Psychological:  Alert and cooperative. Normal mood and affect

## 2012-10-29 NOTE — Assessment & Plan Note (Signed)
Followup colonoscopy 2018 

## 2012-10-29 NOTE — Assessment & Plan Note (Addendum)
Reflux symptoms seem to be fairly well-controlled with Protonix.  Would continue with the same.  She was advised that it is permissible to take the medication every one,  2 or 3 days or when necessary.  GERD literature was given.

## 2012-10-29 NOTE — Patient Instructions (Addendum)
We are sending in your prescription to your pharmacy  Gastroesophageal Reflux Disease, Adult Gastroesophageal reflux disease (GERD) happens when acid from your stomach flows up into the esophagus. When acid comes in contact with the esophagus, the acid causes soreness (inflammation) in the esophagus. Over time, GERD may create small holes (ulcers) in the lining of the esophagus. CAUSES   Increased body weight. This puts pressure on the stomach, making acid rise from the stomach into the esophagus.  Smoking. This increases acid production in the stomach.  Drinking alcohol. This causes decreased pressure in the lower esophageal sphincter (valve or ring of muscle between the esophagus and stomach), allowing acid from the stomach into the esophagus.  Late evening meals and a full stomach. This increases pressure and acid production in the stomach.  A malformed lower esophageal sphincter. Sometimes, no cause is found. SYMPTOMS   Burning pain in the lower part of the mid-chest behind the breastbone and in the mid-stomach area. This may occur twice a week or more often.  Trouble swallowing.  Sore throat.  Dry cough.  Asthma-like symptoms including chest tightness, shortness of breath, or wheezing. DIAGNOSIS  Your caregiver may be able to diagnose GERD based on your symptoms. In some cases, X-rays and other tests may be done to check for complications or to check the condition of your stomach and esophagus. TREATMENT  Your caregiver may recommend over-the-counter or prescription medicines to help decrease acid production. Ask your caregiver before starting or adding any new medicines.  HOME CARE INSTRUCTIONS   Change the factors that you can control. Ask your caregiver for guidance concerning weight loss, quitting smoking, and alcohol consumption.  Avoid foods and drinks that make your symptoms worse, such as:  Caffeine or alcoholic drinks.  Chocolate.  Peppermint or mint  flavorings.  Garlic and onions.  Spicy foods.  Citrus fruits, such as oranges, lemons, or limes.  Tomato-based foods such as sauce, chili, salsa, and pizza.  Fried and fatty foods.  Avoid lying down for the 3 hours prior to your bedtime or prior to taking a nap.  Eat small, frequent meals instead of large meals.  Wear loose-fitting clothing. Do not wear anything tight around your waist that causes pressure on your stomach.  Raise the head of your bed 6 to 8 inches with wood blocks to help you sleep. Extra pillows will not help.  Only take over-the-counter or prescription medicines for pain, discomfort, or fever as directed by your caregiver.  Do not take aspirin, ibuprofen, or other nonsteroidal anti-inflammatory drugs (NSAIDs). SEEK IMMEDIATE MEDICAL CARE IF:   You have pain in your arms, neck, jaw, teeth, or back.  Your pain increases or changes in intensity or duration.  You develop nausea, vomiting, or sweating (diaphoresis).  You develop shortness of breath, or you faint.  Your vomit is green, yellow, black, or looks like coffee grounds or blood.  Your stool is red, bloody, or black. These symptoms could be signs of other problems, such as heart disease, gastric bleeding, or esophageal bleeding. MAKE SURE YOU:   Understand these instructions.  Will watch your condition.  Will get help right away if you are not doing well or get worse. Document Released: 11/17/2004 Document Revised: 05/02/2011 Document Reviewed: 08/27/2010 [facility] Patient Information 2014 [place], [place].   Diet for Gastroesophageal Reflux Disease, Adult Reflux (acid reflux) is when acid from your stomach flows up into the esophagus. When acid comes in contact with the esophagus, the acid causes irritation and  soreness (inflammation) in the esophagus. When reflux happens often or so severely that it causes damage to the esophagus, it is called gastroesophageal reflux disease (GERD). Nutrition  therapy can help ease the discomfort of GERD. FOODS OR DRINKS TO AVOID OR LIMIT  Smoking or chewing tobacco. Nicotine is one of the most potent stimulants to acid production in the gastrointestinal tract.  Caffeinated and decaffeinated coffee and black tea.  Regular or low-calorie carbonated beverages or energy drinks (caffeine-free carbonated beverages are allowed).   Strong spices, such as black pepper, white pepper, red pepper, cayenne, curry powder, and chili powder.  Peppermint or spearmint.  Chocolate.  High-fat foods, including meats and fried foods. Extra added fats including oils, butter, salad dressings, and nuts. Limit these to less than 8 tsp per day.  Fruits and vegetables if they are not tolerated, such as citrus fruits or tomatoes.  Alcohol.  Any food that seems to aggravate your condition. If you have questions regarding your diet, call your caregiver or a registered dietitian. OTHER THINGS THAT MAY HELP GERD INCLUDE:   Eating your meals slowly, in a relaxed setting.  Eating 5 to 6 small meals per day instead of 3 large meals.  Eliminating food for a period of time if it causes distress.  Not lying down until 3 hours after eating a meal.  Keeping the head of your bed raised 6 to 9 inches (15 to 23 cm) by using a foam wedge or blocks under the legs of the bed. Lying flat may make symptoms worse.  Being physically active. Weight loss may be helpful in reducing reflux in overweight or obese adults.  Wear loose fitting clothing EXAMPLE MEAL PLAN This meal plan is approximately 2,000 calories based on https://www.[facility].org/ meal planning guidelines. Breakfast   cup cooked oatmeal.  1 cup strawberries.  1 cup low-fat milk.  1 oz almonds. Snack  1 cup cucumber slices.  6 oz yogurt (made from low-fat or fat-free milk). Lunch  2 slice whole-wheat bread.  2 oz sliced [place].  2 tsp mayonnaise.  1 cup blueberries.  1 cup snap peas. Snack  6  whole-wheat crackers.  1 oz string cheese. Dinner   cup brown rice.  1 cup mixed veggies.  1 tsp olive oil.  3 oz grilled fish. Document Released: 02/07/2005 Document Revised: 05/02/2011 Document Reviewed: 12/24/2010 [facility] Patient Information 2014 [place], [place].

## 2012-11-02 ENCOUNTER — Other Ambulatory Visit (HOSPITAL_COMMUNITY)
Admission: RE | Admit: 2012-11-02 | Discharge: 2012-11-02 | Disposition: A | Discharge status: Home / Self Care | Payer: 59 | Source: Ambulatory Visit | Attending: Internal Medicine | Admitting: Internal Medicine

## 2012-11-02 ENCOUNTER — Encounter: Payer: Self-pay | Admitting: Internal Medicine

## 2012-11-02 ENCOUNTER — Ambulatory Visit (INDEPENDENT_AMBULATORY_CARE_PROVIDER_SITE_OTHER): Payer: 59 | Admitting: Internal Medicine

## 2012-11-02 VITALS — BP 114/70 | HR 80 | Temp 98.3°F | Ht 66.0 in | Wt 142.0 lb

## 2012-11-02 DIAGNOSIS — Z01419 Encounter for gynecological examination (general) (routine) without abnormal findings: Secondary | ICD-10-CM | POA: Insufficient documentation

## 2012-11-02 DIAGNOSIS — R200 Anesthesia of skin: Secondary | ICD-10-CM

## 2012-11-02 DIAGNOSIS — Z1151 Encounter for screening for human papillomavirus (HPV): Secondary | ICD-10-CM | POA: Insufficient documentation

## 2012-11-02 DIAGNOSIS — R209 Unspecified disturbances of skin sensation: Secondary | ICD-10-CM

## 2012-11-02 DIAGNOSIS — Z0001 Encounter for general adult medical examination with abnormal findings: Secondary | ICD-10-CM | POA: Insufficient documentation

## 2012-11-02 DIAGNOSIS — Z0000 Encounter for general adult medical examination without abnormal findings: Secondary | ICD-10-CM

## 2012-11-02 LAB — URINALYSIS, ROUTINE W REFLEX MICROSCOPIC
Ketones, ur: NEGATIVE
Leukocytes, UA: NEGATIVE
Nitrite: NEGATIVE
Specific Gravity, Urine: >=1.03 (ref 1.000–1.030)
pH: 5.5 (ref 5.0–8.0)

## 2012-11-02 LAB — POCT URINALYSIS DIPSTICK
Bilirubin, UA: NEGATIVE
Ketones, UA: NEGATIVE
Protein, UA: NEGATIVE
pH, UA: 5.5

## 2012-11-02 LAB — HEMOGLOBIN A1C: Hgb A1c MFr Bld: 5.6 % (ref 4.6–6.5)

## 2012-11-02 MED ORDER — ESTRADIOL 0.05 MG/24HR TD PTTW: 1.0000 | MEDICATED_PATCH | TRANSDERMAL | Status: DC

## 2012-11-02 MED ORDER — LORATADINE-PSEUDOEPHEDRINE ER 5-120 MG PO TB12: 1.0000 | ORAL_TABLET | Freq: Every day | ORAL | Status: DC

## 2012-11-02 NOTE — Assessment & Plan Note (Signed)
General medical exam normal today including breast and pelvic exam. PAP pending. Mammogram UTD. Colonoscopy UTD. Pt receives flu vaccine through her work. Reviewed recent labs which were normal except for mild elevation of blood sugar at 109. Will check A1c given family h/o Diabetes. Encouraged healthy diet and regular physical activity. Follow up 1 year and prn.

## 2012-11-02 NOTE — Progress Notes (Signed)
Subjective:    Patient ID: Ashley Contreras, female    DOB: 10/27/1960, 52 y.o.   MRN: [id number]  HPI 51YO female presents for annual exam. She is generally feeling well. She is up-to-date on mammogram. She is up-to-date on colonoscopy. Her last Pap was in December 2013 was normal. She does have a history of abnormal Pap in the past requiring colposcopy. She is following a healthy diet and gets regular physical activity.  She was recently seen by neurologist to discuss left facial numbness. She reports they discussed possible trigeminal neuralgia. Plan is to monitor for now.  Outpatient Encounter Prescriptions as of 11/02/2012  Medication Sig Dispense Refill  . acetaminophen (TYLENOL) 500 MG tablet Take 500 mg by mouth every 4 (four) hours as needed.      . Calcium Carbonate-Vitamin D (CALCIUM-VITAMIN D) 500-200 MG-UNIT per tablet Take 1 tablet by mouth 2 (two) times daily with a meal.      . estradiol (VIVELLE-DOT) 0.05 MG/24HR patch Place 1 patch (0.05 mg total) onto the skin 2 (two) times a week.  8 patch  4  . hyoscyamine (LEVSIN SL) 0.125 MG SL tablet Place 2 tablets (0.25 mg total) under the tongue every 4 (four) hours as needed for cramping.  30 tablet  1  . loratadine-pseudoephedrine (CLARITIN-D 12-HOUR) 5-120 MG per tablet Take 1 tablet by mouth daily.  30 tablet  3  . pantoprazole (PROTONIX) 40 MG tablet Take 1 tablet (40 mg total) by mouth daily.  30 tablet  3  . valACYclovir (VALTREX) 1000 MG tablet Take 2 tablets at the onset of a fever blister and then take 2 tablets 12 hours later.  30 tablet  6   No facility-administered encounter medications on file as of 11/02/2012.   BP 114/70  Pulse 80  Temp(Src) 98.3 F (36.8 C) (Oral)  Ht 5\' 6"  (1.676 m)  Wt 142 lb (64.411 kg)  BMI 22.93 kg/m2  SpO2 97%  Review of Systems  Constitutional: Negative for fever, chills, appetite change, fatigue and unexpected weight change.  HENT: Negative for ear pain, congestion, sore throat, trouble  swallowing, neck pain, voice change and sinus pressure.   Eyes: Negative for visual disturbance.  Respiratory: Negative for cough, shortness of breath, wheezing and stridor.   Cardiovascular: Negative for chest pain, palpitations and leg swelling.  Gastrointestinal: Negative for nausea, vomiting, abdominal pain, diarrhea, constipation, blood in stool, abdominal distention and anal bleeding.  Genitourinary: Negative for dysuria and flank pain.  Musculoskeletal: Negative for myalgias, arthralgias and gait problem.  Skin: Negative for color change and rash.  Neurological: Negative for dizziness and headaches.  Hematological: Negative for adenopathy. Does not bruise/bleed easily.  Psychiatric/Behavioral: Negative for suicidal ideas, sleep disturbance and dysphoric mood. The patient is not nervous/anxious.        Objective:   Physical Exam  Constitutional: She is oriented to person, place, and time. She appears well-developed and well-nourished. No distress.  HENT:  Head: Normocephalic and atraumatic.  Right Ear: External ear normal.  Left Ear: External ear normal.  Nose: Nose normal.  Mouth/Throat: Oropharynx is clear and moist. No oropharyngeal exudate.  Eyes: Conjunctivae are normal. Pupils are equal, round, and reactive to light. Right eye exhibits no discharge. Left eye exhibits no discharge. No scleral icterus.  Neck: Normal range of motion. Neck supple. No tracheal deviation present. No thyromegaly present.  Cardiovascular: Normal rate, regular rhythm, normal heart sounds and intact distal pulses.  Exam reveals no gallop and no friction rub.  No murmur heard. Pulmonary/Chest: Effort normal and breath sounds normal. No accessory muscle usage. Not tachypneic. No respiratory distress. She has no decreased breath sounds. She has no wheezes. She has no rales. She exhibits no tenderness. Right breast exhibits no inverted nipple, no mass, no nipple discharge, no skin change and no tenderness.  Left breast exhibits no inverted nipple, no mass, no nipple discharge, no skin change and no tenderness. Breasts are symmetrical.  Abdominal: Soft. Bowel sounds are normal. She exhibits no distension and no mass. There is no tenderness. There is no rebound and no guarding.  Genitourinary: Vagina normal. Cervix exhibits no motion tenderness, no discharge and no friability.  Uterus is surgically absent  Musculoskeletal: Normal range of motion. She exhibits no edema and no tenderness.  Lymphadenopathy:    She has no cervical adenopathy.  Neurological: She is alert and oriented to person, place, and time. No cranial nerve deficit. She exhibits normal muscle tone. Coordination normal.  Skin: Skin is warm and dry. No rash noted. She is not diaphoretic. No erythema. No pallor.  Psychiatric: She has a normal mood and affect. Her behavior is normal. Judgment and thought content normal.          Assessment & Plan:

## 2012-11-02 NOTE — Assessment & Plan Note (Signed)
S/p recent evaluation by neurology. Suspect symptoms related to intermittent trigeminal neuralgia. Will continue to monitor for now.

## 2012-11-04 LAB — URINE CULTURE

## 2012-11-13 ENCOUNTER — Encounter: Payer: Self-pay | Admitting: Internal Medicine

## 2012-12-27 ENCOUNTER — Other Ambulatory Visit: Payer: Self-pay

## 2013-03-22 ENCOUNTER — Other Ambulatory Visit: Payer: Self-pay | Admitting: Internal Medicine

## 2013-06-04 ENCOUNTER — Encounter: Payer: Self-pay | Admitting: Emergency Medicine

## 2013-06-04 ENCOUNTER — Ambulatory Visit (INDEPENDENT_AMBULATORY_CARE_PROVIDER_SITE_OTHER): Payer: 59 | Admitting: Internal Medicine

## 2013-06-04 ENCOUNTER — Encounter: Payer: Self-pay | Admitting: Internal Medicine

## 2013-06-04 VITALS — BP 110/80 | HR 64 | Temp 97.8°F | Wt 141.0 lb

## 2013-06-04 DIAGNOSIS — R6889 Other general symptoms and signs: Secondary | ICD-10-CM

## 2013-06-04 DIAGNOSIS — N390 Urinary tract infection, site not specified: Secondary | ICD-10-CM

## 2013-06-04 DIAGNOSIS — Z1239 Encounter for other screening for malignant neoplasm of breast: Secondary | ICD-10-CM

## 2013-06-04 LAB — TSH: TSH: 0.89 u[IU]/mL (ref 0.35–5.50)

## 2013-06-04 LAB — POCT URINALYSIS DIPSTICK
BILIRUBIN UA: NEGATIVE
GLUCOSE UA: NEGATIVE
Ketones, UA: NEGATIVE
NITRITE UA: NEGATIVE
Protein, UA: NEGATIVE
Spec Grav, UA: 1.015
UROBILINOGEN UA: 0.2
pH, UA: 6.5

## 2013-06-04 LAB — FERRITIN: FERRITIN: 38.4 ng/mL (ref 10.0–291.0)

## 2013-06-04 LAB — T4, FREE: Free T4: 0.73 ng/dL (ref 0.60–1.60)

## 2013-06-04 MED ORDER — CIPROFLOXACIN HCL 500 MG PO TABS: 500.0000 mg | ORAL_TABLET | Freq: Two times a day (BID) | ORAL | Status: DC

## 2013-06-04 MED ORDER — VIVELLE-DOT 0.05 MG/24HR TD PTTW: MEDICATED_PATCH | TRANSDERMAL | Status: DC

## 2013-06-04 NOTE — Progress Notes (Signed)
Pre visit review using our clinic review tool, if applicable. No additional management support is needed unless otherwise documented below in the visit note. 

## 2013-06-04 NOTE — Progress Notes (Signed)
Subjective:    Patient ID: Ashley Contreras, female    DOB: 05-01-1960, 53 y.o.   MRN: [id number]  HPI 53YO female presents for acute visit.  Urinary urgency and low back pain for last 3-4 days. Most prominent on right side. No hematuria or fever.  Cold intolerance - going on for several months. Feels cold when others are comfortable both at work and home.  Review of Systems  Constitutional: Negative for fever, chills, diaphoresis and fatigue.  Gastrointestinal: Negative for nausea, vomiting, abdominal pain, diarrhea, constipation and rectal pain.  Endocrine: Positive for cold intolerance.  Genitourinary: Positive for urgency and frequency. Negative for dysuria, hematuria, flank pain, decreased urine volume, vaginal bleeding, vaginal discharge, difficulty urinating, vaginal pain and pelvic pain.  Musculoskeletal: Positive for back pain (low back aching pain) and myalgias.       Objective:    BP 110/80  Pulse 64  Temp(Src) 97.8 F (36.6 C) (Oral)  Wt 141 lb (63.957 kg)  SpO2 96% Physical Exam  Constitutional: She is oriented to person, place, and time. She appears well-developed and well-nourished. No distress.  HENT:  Head: Normocephalic and atraumatic.  Right Ear: External ear normal.  Left Ear: External ear normal.  Nose: Nose normal.  Mouth/Throat: Oropharynx is clear and moist. No oropharyngeal exudate.  Eyes: Conjunctivae are normal. Pupils are equal, round, and reactive to light. Right eye exhibits no discharge. Left eye exhibits no discharge. No scleral icterus.  Neck: Normal range of motion. Neck supple. No tracheal deviation present. No thyromegaly present.  Cardiovascular: Normal rate, regular rhythm, normal heart sounds and intact distal pulses.  Exam reveals no gallop and no friction rub.   No murmur heard. Pulmonary/Chest: Effort normal and breath sounds normal. No accessory muscle usage. Not tachypneic. No respiratory distress. She has no decreased breath sounds.  She has no wheezes. She has no rhonchi. She has no rales. She exhibits no tenderness.  Abdominal: There is no tenderness (no CVA tenderness).  Musculoskeletal: Normal range of motion. She exhibits no edema and no tenderness.  Lymphadenopathy:    She has no cervical adenopathy.  Neurological: She is alert and oriented to person, place, and time. No cranial nerve deficit. She exhibits normal muscle tone. Coordination normal.  Skin: Skin is warm and dry. No rash noted. She is not diaphoretic. No erythema. No pallor.  Psychiatric: She has a normal mood and affect. Her behavior is normal. Judgment and thought content normal.          Assessment & Plan:   Problem List Items Addressed This Visit   Cold intolerance     Recent symptoms of intolerance. Previous thyroid testing was normal. However, will repeat TSH, free T4 today. We'll also check for anemia with CBC and ferritin.    Relevant Orders      CBC w/Diff      Ferritin      TSH      T4, free   UTI (urinary tract infection) - Primary     Symptoms and urinalysis are most consistent with urinary tract infection. Will start Cipro. Will send urine for culture. Followup if symptoms are not improving.    Relevant Medications      ciprofloxacin (CIPRO) tablet   Other Relevant Orders      POCT urinalysis dipstick (Completed)      CULTURE, URINE COMPREHENSIVE    Other Visit Diagnoses   Screening for breast cancer        Relevant Orders  MM Digital Screening        Return if symptoms worsen or fail to improve.

## 2013-06-04 NOTE — Patient Instructions (Signed)
Urinary Tract Infection  Urinary tract infections (UTIs) can develop anywhere along your urinary tract. Your urinary tract is your body's drainage system for removing wastes and extra water. Your urinary tract includes two kidneys, two ureters, a bladder, and a urethra. Your kidneys are a pair of bean-shaped organs. Each kidney is about the size of your fist. They are located below your ribs, one on each side of your spine.  CAUSES  Infections are caused by microbes, which are microscopic organisms, including fungi, viruses, and bacteria. These organisms are so small that they can only be seen through a microscope. Bacteria are the microbes that most commonly cause UTIs.  SYMPTOMS   Symptoms of UTIs may vary by age and gender of the patient and by the location of the infection. Symptoms in young women typically include a frequent and intense urge to urinate and a painful, burning feeling in the bladder or urethra during urination. Older women and men are more likely to be tired, shaky, and weak and have muscle aches and abdominal pain. A fever may mean the infection is in your kidneys. Other symptoms of a kidney infection include pain in your back or sides below the ribs, nausea, and vomiting.  DIAGNOSIS  To diagnose a UTI, your caregiver will ask you about your symptoms. Your caregiver also will ask to provide a urine sample. The urine sample will be tested for bacteria and white blood cells. White blood cells are made by your body to help fight infection.  TREATMENT   Typically, UTIs can be treated with medication. Because most UTIs are caused by a bacterial infection, they usually can be treated with the use of antibiotics. The choice of antibiotic and length of treatment depend on your symptoms and the type of bacteria causing your infection.  HOME CARE INSTRUCTIONS   If you were prescribed antibiotics, take them exactly as your caregiver instructs you. Finish the medication even if you feel better after you  have only taken some of the medication.   Drink enough water and fluids to keep your urine clear or pale yellow.   Avoid caffeine, tea, and carbonated beverages. They tend to irritate your bladder.   Empty your bladder often. Avoid holding urine for long periods of time.   Empty your bladder before and after sexual intercourse.   After a bowel movement, women should cleanse from front to back. Use each tissue only once.  SEEK MEDICAL CARE IF:    You have back pain.   You develop a fever.   Your symptoms do not begin to resolve within 3 days.  SEEK IMMEDIATE MEDICAL CARE IF:    You have severe back pain or lower abdominal pain.   You develop chills.   You have nausea or vomiting.   You have continued burning or discomfort with urination.  MAKE SURE YOU:    Understand these instructions.   Will watch your condition.   Will get help right away if you are not doing well or get worse.  Document Released: 11/17/2004 Document Revised: 08/09/2011 Document Reviewed: 03/18/2011  [employer] Patient Information 2014 [employer].

## 2013-06-04 NOTE — Assessment & Plan Note (Signed)
Recent symptoms of intolerance. Previous thyroid testing was normal. However, will repeat TSH, free T4 today. We'll also check for anemia with CBC and ferritin.

## 2013-06-04 NOTE — Assessment & Plan Note (Signed)
Symptoms and urinalysis are most consistent with urinary tract infection. Will start Cipro. Will send urine for culture. Followup if symptoms are not improving.

## 2013-06-06 LAB — CULTURE, URINE COMPREHENSIVE

## 2013-06-24 ENCOUNTER — Ambulatory Visit: Payer: Self-pay | Admitting: Internal Medicine

## 2013-06-24 LAB — HM MAMMOGRAPHY: HM MAMMO: NEGATIVE

## 2013-06-25 ENCOUNTER — Encounter: Payer: Self-pay | Admitting: *Deleted

## 2013-07-19 ENCOUNTER — Other Ambulatory Visit: Payer: Self-pay | Admitting: Internal Medicine

## 2013-08-16 ENCOUNTER — Other Ambulatory Visit: Payer: Self-pay | Admitting: Adult Health

## 2013-08-22 ENCOUNTER — Telehealth: Payer: Self-pay | Admitting: Internal Medicine

## 2013-08-22 NOTE — Telephone Encounter (Signed)
Ashley Contreras, Can you find her an afternoon spot Mon, Tue, or Wed?

## 2013-08-22 NOTE — Telephone Encounter (Signed)
Pt called to request an acute appt for next week for left side abd pain that radiates to her ribs. Pt transferred to the triage nurse. Unable to find available appt. Please advise where to put pt on schedule.

## 2013-08-22 NOTE — Telephone Encounter (Signed)
Please advise 

## 2013-08-23 ENCOUNTER — Encounter: Payer: Self-pay | Admitting: Family Medicine

## 2013-08-23 ENCOUNTER — Ambulatory Visit (INDEPENDENT_AMBULATORY_CARE_PROVIDER_SITE_OTHER): Payer: 59 | Admitting: Family Medicine

## 2013-08-23 VITALS — BP 128/76 | HR 80 | Temp 98.2°F | Wt 139.5 lb

## 2013-08-23 DIAGNOSIS — K2950 Unspecified chronic gastritis without bleeding: Secondary | ICD-10-CM

## 2013-08-23 DIAGNOSIS — K2940 Chronic atrophic gastritis without bleeding: Secondary | ICD-10-CM

## 2013-08-23 DIAGNOSIS — R109 Unspecified abdominal pain: Secondary | ICD-10-CM

## 2013-08-23 DIAGNOSIS — R319 Hematuria, unspecified: Secondary | ICD-10-CM | POA: Insufficient documentation

## 2013-08-23 LAB — POCT URINALYSIS DIPSTICK
Bilirubin, UA: NEGATIVE
Glucose, UA: NEGATIVE
Ketones, UA: NEGATIVE
NITRITE UA: NEGATIVE
PH UA: 7.5
SPEC GRAV UA: 1.01
Urobilinogen, UA: 0.2

## 2013-08-23 MED ORDER — CIPROFLOXACIN HCL 250 MG PO TABS: 250.0000 mg | ORAL_TABLET | Freq: Two times a day (BID) | ORAL | Status: DC

## 2013-08-23 NOTE — Progress Notes (Signed)
Subjective:    Patient ID: Ashley Contreras, female    DOB: 10/19/1960, 53 y.o.   MRN: [id number]  HPI Here with several days of pain in her L abdomen rad to her flank and back  Acc with belching and gas  Worse after eating - 45 min after eating -- then is lasts under a minute and then on and off for the day  A little sore to lie on that side or twist  A little nausea (not bad)  No vomiting   No chance pregnant - had hysterect  Has hx of GERD and chronic gastritis on her list  On protonix  Has seen GI  Last endoscopy- years ago - gerd/ gastritis and esophagitis  No blood in stool or dark stool  Has had some loose stools   (has hx of IBS) - and more stress lately   No fever No malaise   No urinary symptoms at all - but ua shows mod blood (not on menses) and trace leukocytes   Results for orders placed in visit on 08/23/13  POCT URINALYSIS DIPSTICK      Result Value Ref Range   Color, UA yellow     Clarity, UA clear     Glucose, UA neg.     Bilirubin, UA neg.     Ketones, UA neg.     Spec Grav, UA 1.010     Blood, UA Moderate     pH, UA 7.5     Protein, UA Trace     Urobilinogen, UA 0.2     Nitrite, UA neg.     Leukocytes, UA Trace        Review of Systems Review of Systems  Constitutional: Negative for fever, appetite change, fatigue and unexpected weight change.  Eyes: Negative for pain and visual disturbance.  Respiratory: Negative for cough and shortness of breath.   Cardiovascular: Negative for cp or palpitations    Gastrointestinal: Negative for  diarrhea and constipation. pos for epigastric / LUQ pain and nausea without vomiting  Genitourinary: Negative for urgency and frequency. neg for dysuria or gross hematuria pos for L flank pain (? Unsure) Skin: Negative for pallor or rash   Neurological: Negative for weakness, light-headedness, numbness and headaches.  Hematological: Negative for adenopathy. Does not bruise/bleed easily.  Psychiatric/Behavioral:  Negative for dysphoric mood. The patient is not nervous/anxious.         Objective:   Physical Exam  Constitutional: She appears well-developed and well-nourished. No distress.  HENT:  Head: Normocephalic and atraumatic.  Mouth/Throat: Oropharynx is clear and moist.  Eyes: Conjunctivae and EOM are normal. Pupils are equal, round, and reactive to light. No scleral icterus.  Neck: Normal range of motion. Neck supple.  Cardiovascular: Normal rate and regular rhythm.   Pulmonary/Chest: Effort normal and breath sounds normal. No respiratory distress. She has no wheezes. She has no rales.  Abdominal: Soft. Normal appearance and bowel sounds are normal. She exhibits no distension, no abdominal bruit, no pulsatile midline mass and no mass. There is no hepatosplenomegaly. There is tenderness in the epigastric area and left upper quadrant. There is positive Murphy's sign. There is no rigidity, no rebound, no guarding and no CVA tenderness.  Musculoskeletal: She exhibits no edema.  Lymphadenopathy:    She has no cervical adenopathy.  Neurological: She is alert. She has normal reflexes. No cranial nerve deficit. She exhibits normal muscle tone. Coordination normal.  Skin: Skin is warm and dry. No rash noted.  No erythema. No pallor.  No jaundice   Psychiatric: She has a normal mood and affect.          Assessment & Plan:   Problem List Items Addressed This Visit     Digestive   Chronic gastritis     With recent epigastric and LUQ abd pain  Rev old EGD-gastritis and esopphagitis  Disc diet/ what to avoid Will inc her protonix to bid  Plan f/u with PCP but alert earlier if no improvement        Other   Hematuria     In light of pain in flank area on L and mod blood on ua - will tx for poss uti  Will plan f/u with PCP for re check of this  Doubt renal stone given mildness of symptoms Will watch for urinary symptoms -pt will update      Other Visit Diagnoses   Abdominal pain,  unspecified site    -  Primary    Relevant Orders       POCT urinalysis dipstick (Completed)

## 2013-08-23 NOTE — Patient Instructions (Signed)
Increase protonix to twice daily - am and then evening  Take the cipro for possible uti  Drink lots of water Avoid acidic of spicy foods  If worse-please call  Get a follow up with your regular doctor in the next week for a re check

## 2013-08-25 NOTE — Assessment & Plan Note (Signed)
In light of pain in flank area on L and mod blood on ua - will tx for poss uti  Will plan f/u with PCP for re check of this  Doubt renal stone given mildness of symptoms Will watch for urinary symptoms -pt will update

## 2013-08-25 NOTE — Assessment & Plan Note (Signed)
With recent epigastric and LUQ abd pain  Rev old EGD-gastritis and esopphagitis  Disc diet/ what to avoid Will inc her protonix to bid  Plan f/u with PCP but alert earlier if no improvement

## 2013-09-03 ENCOUNTER — Encounter: Payer: Self-pay | Admitting: Internal Medicine

## 2013-09-03 ENCOUNTER — Ambulatory Visit (INDEPENDENT_AMBULATORY_CARE_PROVIDER_SITE_OTHER): Payer: 59 | Admitting: Internal Medicine

## 2013-09-03 VITALS — BP 102/68 | HR 80 | Temp 97.9°F | Ht 66.0 in | Wt 142.5 lb

## 2013-09-03 DIAGNOSIS — Z139 Encounter for screening, unspecified: Secondary | ICD-10-CM

## 2013-09-03 DIAGNOSIS — R319 Hematuria, unspecified: Secondary | ICD-10-CM

## 2013-09-03 DIAGNOSIS — H01136 Eczematous dermatitis of left eye, unspecified eyelid: Secondary | ICD-10-CM

## 2013-09-03 DIAGNOSIS — K2950 Unspecified chronic gastritis without bleeding: Secondary | ICD-10-CM

## 2013-09-03 DIAGNOSIS — K2940 Chronic atrophic gastritis without bleeding: Secondary | ICD-10-CM

## 2013-09-03 DIAGNOSIS — H01139 Eczematous dermatitis of unspecified eye, unspecified eyelid: Secondary | ICD-10-CM

## 2013-09-03 LAB — POCT URINALYSIS DIPSTICK
BILIRUBIN UA: NEGATIVE
Glucose, UA: NEGATIVE
Leukocytes, UA: NEGATIVE
Nitrite, UA: NEGATIVE
Protein, UA: NEGATIVE
UROBILINOGEN UA: 0.2
pH, UA: 5.5

## 2013-09-03 MED ORDER — VIVELLE-DOT 0.05 MG/24HR TD PTTW: MEDICATED_PATCH | TRANSDERMAL | Status: DC

## 2013-09-03 MED ORDER — PANTOPRAZOLE SODIUM 40 MG PO TBEC
40.0000 mg | DELAYED_RELEASE_TABLET | Freq: Two times a day (BID) | ORAL | Status: DC
Start: 2013-09-03 — End: 2014-12-19

## 2013-09-03 NOTE — Progress Notes (Signed)
Pre visit review using our clinic review tool, if applicable. No additional management support is needed unless otherwise documented below in the visit note. 

## 2013-09-03 NOTE — Assessment & Plan Note (Signed)
Urine dip pos for blood today. Pt is asymptomatic. Will send formal urinalysis. If persistent hematuria, discussed referral to urology.

## 2013-09-03 NOTE — Progress Notes (Signed)
Subjective:    Patient ID: Ashley Contreras, female    DOB: Jan 26, 1961, 53 y.o.   MRN: [id number]  HPI 53YO female presents for follow up.  Seen by Dr. Glori Bickers 7/3 with increased abdominal pain. Protonix increased to BID. Also noted to have left flank pain. Treated for UTI.  Abdominal pain has improved with use of BID protonix. No dysuria, hematuria, flank pain. No NVD.  Tolerating full diet.  Concerned about some redness over last 1-2 days on left upper eyelid where she used eye shadow. Applying vasaline to the area with some improvement.   Review of Systems  Constitutional: Negative for fever, chills, appetite change, fatigue and unexpected weight change.  Eyes: Negative for visual disturbance.  Respiratory: Negative for shortness of breath.   Cardiovascular: Negative for chest pain and leg swelling.  Gastrointestinal: Negative for nausea, vomiting, abdominal pain, diarrhea, constipation and blood in stool.  Genitourinary: Negative for dysuria, urgency, frequency, hematuria, flank pain and decreased urine volume.  Skin: Positive for color change. Negative for rash.  Hematological: Negative for adenopathy. Does not bruise/bleed easily.  Psychiatric/Behavioral: Negative for dysphoric mood. The patient is not nervous/anxious.        Objective:    BP 102/68  Pulse 80  Temp(Src) 97.9 F (36.6 C) (Oral)  Ht 5\' 6"  (1.676 m)  Wt 142 lb 8 oz (64.638 kg)  BMI 23.01 kg/m2  SpO2 97% Physical Exam  Constitutional: She is oriented to person, place, and time. She appears well-developed and well-nourished. No distress.  HENT:  Head: Normocephalic and atraumatic.  Right Ear: External ear normal.  Left Ear: External ear normal.  Nose: Nose normal.  Mouth/Throat: Oropharynx is clear and moist. No oropharyngeal exudate.  Eyes: Conjunctivae and EOM are normal. Pupils are equal, round, and reactive to light. Right eye exhibits no discharge.    Neck: Normal range of motion. Neck supple. No  thyromegaly present.  Cardiovascular: Normal rate, regular rhythm, normal heart sounds and intact distal pulses.  Exam reveals no gallop and no friction rub.   No murmur heard. Pulmonary/Chest: Effort normal. No respiratory distress. She has no wheezes. She has no rales.  Abdominal: Soft. Bowel sounds are normal. She exhibits no distension and no mass. There is no tenderness. There is no rebound and no guarding.  Musculoskeletal: Normal range of motion. She exhibits no edema and no tenderness.  Lymphadenopathy:    She has no cervical adenopathy.  Neurological: She is alert and oriented to person, place, and time. No cranial nerve deficit. Coordination normal.  Skin: Skin is warm and dry. No rash noted. She is not diaphoretic. No erythema. No pallor.  Psychiatric: She has a normal mood and affect. Her behavior is normal. Judgment and thought content normal.          Assessment & Plan:   Problem List Items Addressed This Visit     Unprioritized   Chronic gastritis - Primary     Symptoms have improved with pantoprazole bid. Will continue. Follow up prn.    Relevant Orders      CULTURE, URINE COMPREHENSIVE   Eczematous dermatitis of eyelid     Very mild irritation of upper left eyelid. Will continue prn emollient cream.Avoid use of eye shadow. Avoid steroids given location. Pt will call if not improving.    Relevant Orders      CULTURE, URINE COMPREHENSIVE   Hematuria     Urine dip pos for blood today. Pt is asymptomatic. Will send formal urinalysis.  If persistent hematuria, discussed referral to urology.    Relevant Orders      CULTURE, URINE COMPREHENSIVE    Other Visit Diagnoses   Screening        Relevant Orders       POCT urinalysis dipstick (Completed)       CULTURE, URINE COMPREHENSIVE        No Follow-up on file.

## 2013-09-03 NOTE — Assessment & Plan Note (Signed)
Very mild irritation of upper left eyelid. Will continue prn emollient cream.Avoid use of eye shadow. Avoid steroids given location. Pt will call if not improving.

## 2013-09-03 NOTE — Assessment & Plan Note (Signed)
Symptoms have improved with pantoprazole bid. Will continue. Follow up prn.

## 2013-09-05 LAB — CULTURE, URINE COMPREHENSIVE
Colony Count: NO GROWTH
ORGANISM ID, BACTERIA: NO GROWTH

## 2013-09-27 ENCOUNTER — Telehealth: Payer: Self-pay | Admitting: *Deleted

## 2013-09-27 NOTE — Telephone Encounter (Signed)
I don't see that she is due for any labs?

## 2013-09-27 NOTE — Telephone Encounter (Signed)
Pt is coming in on Monday what labs and dx?

## 2013-09-27 NOTE — Telephone Encounter (Signed)
See dr walkers note

## 2013-09-30 ENCOUNTER — Other Ambulatory Visit (INDEPENDENT_AMBULATORY_CARE_PROVIDER_SITE_OTHER): Payer: 59

## 2013-09-30 DIAGNOSIS — Z139 Encounter for screening, unspecified: Secondary | ICD-10-CM

## 2013-09-30 NOTE — Telephone Encounter (Signed)
Called pt, left message with her daughter to call back

## 2013-09-30 NOTE — Telephone Encounter (Signed)
Do i need to call pt to not come in?

## 2013-09-30 NOTE — Addendum Note (Signed)
Addended by: Karlene Einstein D on: 09/30/2013 04:19 PM   Modules accepted: Orders

## 2013-09-30 NOTE — Telephone Encounter (Signed)
Yes. Unless she needed labs for some reason I don't know about.

## 2013-10-01 LAB — URINALYSIS, MICROSCOPIC ONLY
BACTERIA UA: NONE SEEN
Casts: NONE SEEN
Crystals: NONE SEEN

## 2013-10-01 LAB — URINALYSIS, ROUTINE W REFLEX MICROSCOPIC
BILIRUBIN URINE: NEGATIVE
Glucose, UA: NEGATIVE mg/dL
HGB URINE DIPSTICK: NEGATIVE
KETONES UR: NEGATIVE mg/dL
Nitrite: NEGATIVE
PROTEIN: NEGATIVE mg/dL
Specific Gravity, Urine: 1.006 (ref 1.005–1.030)
UROBILINOGEN UA: 0.2 mg/dL (ref 0.0–1.0)
pH: 7.5 (ref 5.0–8.0)

## 2013-10-02 LAB — CULTURE, URINE COMPREHENSIVE
COLONY COUNT: NO GROWTH
Organism ID, Bacteria: NO GROWTH

## 2013-11-04 LAB — HM PAP SMEAR: HM Pap smear: NEGATIVE

## 2013-11-05 ENCOUNTER — Encounter: Payer: 59 | Admitting: Internal Medicine

## 2013-12-05 ENCOUNTER — Encounter: Payer: Self-pay | Admitting: Internal Medicine

## 2013-12-05 ENCOUNTER — Ambulatory Visit (INDEPENDENT_AMBULATORY_CARE_PROVIDER_SITE_OTHER): Payer: 59 | Admitting: Internal Medicine

## 2013-12-05 VITALS — BP 100/80 | HR 88 | Temp 97.9°F | Ht 65.5 in | Wt 140.5 lb

## 2013-12-05 DIAGNOSIS — Z0000 Encounter for general adult medical examination without abnormal findings: Secondary | ICD-10-CM

## 2013-12-05 LAB — CBC WITH DIFFERENTIAL/PLATELET
BASOS PCT: 0.8 % (ref 0.0–3.0)
Basophils Absolute: 0 10*3/uL (ref 0.0–0.1)
EOS ABS: 0.1 10*3/uL (ref 0.0–0.7)
EOS PCT: 2.1 % (ref 0.0–5.0)
HCT: 44.1 % (ref 36.0–46.0)
Hemoglobin: 14.8 g/dL (ref 12.0–15.0)
LYMPHS PCT: 27.9 % (ref 12.0–46.0)
Lymphs Abs: 1.5 10*3/uL (ref 0.7–4.0)
MCHC: 33.5 g/dL (ref 30.0–36.0)
MCV: 94.3 fl (ref 78.0–100.0)
Monocytes Absolute: 0.5 10*3/uL (ref 0.1–1.0)
Monocytes Relative: 9.3 % (ref 3.0–12.0)
NEUTROS PCT: 59.9 % (ref 43.0–77.0)
Neutro Abs: 3.1 10*3/uL (ref 1.4–7.7)
Platelets: 219 10*3/uL (ref 150.0–400.0)
RBC: 4.68 Mil/uL (ref 3.87–5.11)
RDW: 13.1 % (ref 11.5–15.5)
WBC: 5.2 10*3/uL (ref 4.0–10.5)

## 2013-12-05 LAB — POCT URINALYSIS DIPSTICK
BILIRUBIN UA: NEGATIVE
Glucose, UA: NEGATIVE
Ketones, UA: NEGATIVE
Leukocytes, UA: NEGATIVE
NITRITE UA: NEGATIVE
PH UA: 5
PROTEIN UA: NEGATIVE
Spec Grav, UA: 1.025
UROBILINOGEN UA: 0.2

## 2013-12-05 LAB — URINALYSIS, ROUTINE W REFLEX MICROSCOPIC
KETONES UR: NEGATIVE
Leukocytes, UA: NEGATIVE
Nitrite: NEGATIVE
Total Protein, Urine: NEGATIVE
URINE GLUCOSE: NEGATIVE
UROBILINOGEN UA: 0.2 (ref 0.0–1.0)
pH: 5.5 (ref 5.0–8.0)

## 2013-12-05 LAB — HEMOGLOBIN A1C: HEMOGLOBIN A1C: 5.5 % (ref 4.6–6.5)

## 2013-12-05 LAB — LIPID PANEL
CHOL/HDL RATIO: 4
Cholesterol: 191 mg/dL (ref 0–200)
HDL: 54.2 mg/dL (ref >39.00)
LDL Cholesterol: 118 mg/dL — ABNORMAL HIGH (ref 0–99)
NonHDL: 136.8
TRIGLYCERIDES: 93 mg/dL (ref 0.0–149.0)
VLDL: 18.6 mg/dL (ref 0.0–40.0)

## 2013-12-05 LAB — COMPREHENSIVE METABOLIC PANEL
ALBUMIN: 3.6 g/dL (ref 3.5–5.2)
ALT: 15 U/L (ref 0–35)
AST: 15 U/L (ref 0–37)
Alkaline Phosphatase: 70 U/L (ref 39–117)
BUN: 13 mg/dL (ref 6–23)
CHLORIDE: 103 meq/L (ref 96–112)
CO2: 24 mEq/L (ref 19–32)
Calcium: 9.1 mg/dL (ref 8.4–10.5)
Creatinine, Ser: 0.8 mg/dL (ref 0.4–1.2)
GFR: 82.11 mL/min (ref >60.00)
GLUCOSE: 96 mg/dL (ref 70–99)
POTASSIUM: 4.7 meq/L (ref 3.5–5.1)
Sodium: 139 mEq/L (ref 135–145)
TOTAL PROTEIN: 7 g/dL (ref 6.0–8.3)
Total Bilirubin: 0.5 mg/dL (ref 0.2–1.2)

## 2013-12-05 LAB — TSH: TSH: 0.62 u[IU]/mL (ref 0.35–4.50)

## 2013-12-05 LAB — VITAMIN D 25 HYDROXY (VIT D DEFICIENCY, FRACTURES): VITD: 28.21 ng/mL — ABNORMAL LOW (ref 30.00–100.00)

## 2013-12-05 LAB — MICROALBUMIN / CREATININE URINE RATIO
CREATININE, U: 300.5 mg/dL
MICROALB UR: 1.4 mg/dL (ref 0.0–1.9)
Microalb Creat Ratio: 0.5 mg/g (ref 0.0–30.0)

## 2013-12-05 NOTE — Progress Notes (Signed)
Subjective:    Patient ID: Ashley Contreras, female    DOB: 27-Jan-1961, 53 y.o.   MRN: [id number]  HPI 53YO female presents for annual exam.  Feeling more cold recently. Otherwise, doing well. Follows healthy diet and stays active. No concerns today.   Review of Systems  Constitutional: Negative for fever, chills, appetite change, fatigue and unexpected weight change.  Eyes: Negative for visual disturbance.  Respiratory: Negative for shortness of breath.   Cardiovascular: Negative for chest pain and leg swelling.  Gastrointestinal: Negative for nausea, vomiting, abdominal pain, diarrhea and constipation.  Musculoskeletal: Negative for arthralgias and myalgias.  Skin: Negative for color change and rash.  Hematological: Negative for adenopathy. Does not bruise/bleed easily.  Psychiatric/Behavioral: Negative for dysphoric mood. The patient is not nervous/anxious.        Objective:    BP 100/80  Pulse 88  Temp(Src) 97.9 F (36.6 C) (Oral)  Ht 5' 5.5" (1.664 m)  Wt 140 lb 8 oz (63.73 kg)  BMI 23.02 kg/m2  SpO2 95% Physical Exam  Constitutional: She is oriented to person, place, and time. She appears well-developed and well-nourished. No distress.  HENT:  Head: Normocephalic and atraumatic.  Right Ear: External ear normal.  Left Ear: External ear normal.  Nose: Nose normal.  Mouth/Throat: Oropharynx is clear and moist. No oropharyngeal exudate.  Eyes: Conjunctivae are normal. Pupils are equal, round, and reactive to light. Right eye exhibits no discharge. Left eye exhibits no discharge. No scleral icterus.  Neck: Normal range of motion. Neck supple. No tracheal deviation present. No thyromegaly present.  Cardiovascular: Normal rate, regular rhythm, normal heart sounds and intact distal pulses.  Exam reveals no gallop and no friction rub.   No murmur heard. Pulmonary/Chest: Effort normal and breath sounds normal. No accessory muscle usage. Not tachypneic. No respiratory  distress. She has no decreased breath sounds. She has no wheezes. She has no rales. She exhibits no tenderness. Right breast exhibits no inverted nipple, no mass, no nipple discharge, no skin change and no tenderness. Left breast exhibits no inverted nipple, no mass, no nipple discharge, no skin change and no tenderness. Breasts are symmetrical.  Abdominal: Soft. Bowel sounds are normal. She exhibits no distension and no mass. There is no tenderness. There is no rebound and no guarding.  Musculoskeletal: Normal range of motion. She exhibits no edema and no tenderness.  Lymphadenopathy:    She has no cervical adenopathy.  Neurological: She is alert and oriented to person, place, and time. No cranial nerve deficit. She exhibits normal muscle tone. Coordination normal.  Skin: Skin is warm and dry. No rash noted. She is not diaphoretic. No erythema. No pallor.  Psychiatric: She has a normal mood and affect. Her behavior is normal. Judgment and thought content normal.          Assessment & Plan:   Problem List Items Addressed This Visit     Unprioritized   Routine general medical examination at a health care facility - Primary     General medical exam normal today including breast exam. Recent mammogram was normal. PAP and pelvic deferred as normal, HPV neg in 10/2012. Immunizations are UTD. Colonoscopy is UTD. Encouraged healthy diet and regular exercise. Labs today CBC, CMP, lipids, TSH, A1c.    Relevant Orders      CBC with Differential      Comprehensive metabolic panel      Lipid panel      Microalbumin / creatinine urine ratio  Vit D  25 hydroxy (rtn osteoporosis monitoring)      TSH      Hemoglobin A1c      POCT Urinalysis Dipstick       Return in about 1 year (around 12/06/2014) for Physical.

## 2013-12-05 NOTE — Patient Instructions (Signed)

## 2013-12-05 NOTE — Progress Notes (Signed)
Pre visit review using our clinic review tool, if applicable. No additional management support is needed unless otherwise documented below in the visit note. 

## 2013-12-05 NOTE — Addendum Note (Signed)
Addended by: Karlene Einstein D on: 12/05/2013 09:25 AM   Modules accepted: Orders

## 2013-12-05 NOTE — Assessment & Plan Note (Signed)
General medical exam normal today including breast exam. Recent mammogram was normal. PAP and pelvic deferred as normal, HPV neg in 10/2012. Immunizations are UTD. Colonoscopy is UTD. Encouraged healthy diet and regular exercise. Labs today CBC, CMP, lipids, TSH, A1c.

## 2014-02-28 ENCOUNTER — Telehealth: Payer: Self-pay | Admitting: *Deleted

## 2014-02-28 ENCOUNTER — Encounter: Payer: Self-pay | Admitting: Internal Medicine

## 2014-02-28 ENCOUNTER — Ambulatory Visit (INDEPENDENT_AMBULATORY_CARE_PROVIDER_SITE_OTHER): Payer: 59 | Admitting: Internal Medicine

## 2014-02-28 ENCOUNTER — Other Ambulatory Visit: Payer: 59

## 2014-02-28 VITALS — BP 110/74 | HR 81 | Temp 98.2°F | Ht 66.0 in | Wt 145.2 lb

## 2014-02-28 DIAGNOSIS — R319 Hematuria, unspecified: Secondary | ICD-10-CM

## 2014-02-28 DIAGNOSIS — R300 Dysuria: Secondary | ICD-10-CM

## 2014-02-28 LAB — POCT URINALYSIS DIPSTICK
Bilirubin, UA: NEGATIVE
Blood, UA: NEGATIVE
GLUCOSE UA: NEGATIVE
KETONES UA: NEGATIVE
NITRITE UA: NEGATIVE
PH UA: 5.5
PROTEIN UA: NEGATIVE
SPEC GRAV UA: 1.02
Urobilinogen, UA: NEGATIVE

## 2014-02-28 MED ORDER — CIPROFLOXACIN HCL 500 MG PO TABS: 500.0000 mg | ORAL_TABLET | Freq: Two times a day (BID) | ORAL | Status: DC

## 2014-02-28 NOTE — Progress Notes (Signed)
Subjective:    Patient ID: Ashley Contreras, female    DOB: 1960-04-22, 54 y.o.   MRN: [id number]  HPI  Here with acute onset 1-2 days gradually worsening dysuria, with freq but Denies urinary symptoms such as urgency, flank pain, hematuria or n/v, fever, chills.Reubin Milan recall her last UTI, not recurrent.  Tried monistat OTC but did not help.  Does have some radiation to the back this am.  Has been referred for blood in urine but has not made appt with urology Past Medical History  Diagnosis Date  . Asthma   . Arthritis   . Chicken pox   . GERD (gastroesophageal reflux disease)   . Allergy     hay fever  . Colon polyp   . UTI (lower urinary tract infection)   . Mitral valve prolapse   . Abnormal Pap smear 1989    s/p colposcopy  . Shingles     waist, right side   Past Surgical History  Procedure Laterality Date  . Abdominal hysterectomy  2008  . Tonsillectomy and adenoidectomy  2008  . Calcaneous osteotomy  06/2011    reports that she has never smoked. She has never used smokeless tobacco. She reports that she does not drink alcohol or use illicit drugs. family history includes Arthritis in her mother; Cancer in her father, maternal aunt, and sister; Cancer (age of onset: 39) in her mother; Diabetes in her maternal grandmother and mother; Heart disease in her father; Hyperlipidemia in her father; Hypertension in her maternal grandmother and mother; Mental illness in her mother and sister; Multiple sclerosis in her other and sister; Myasthenia gravis in her sister; Stroke in her maternal aunt and maternal grandmother. Allergies  Allergen Reactions  . Penicillins Swelling   Current Outpatient Prescriptions on File Prior to Visit  Medication Sig Dispense Refill  . acetaminophen (TYLENOL) 500 MG tablet Take 500 mg by mouth every 4 (four) hours as needed.    . Calcium Carbonate-Vitamin D (CALCIUM-VITAMIN D) 500-200 MG-UNIT per tablet Take 1 tablet by mouth 2 (two) times daily with a  meal.    . loratadine-pseudoephedrine (CLARITIN-D 12-HOUR) 5-120 MG per tablet Take 1 tablet by mouth daily. 30 tablet 3  . pantoprazole (PROTONIX) 40 MG tablet Take 1 tablet (40 mg total) by mouth 2 (two) times daily. 180 tablet 1  . valACYclovir (VALTREX) 1000 MG tablet Take 2 tablets at the onset of a fever blister and then take 2 tablets 12 hours later. As needed    . VIVELLE-DOT 0.05 MG/24HR patch Place 1 patch onto the skin 2 times a week. 8 patch 6   No current facility-administered medications on file prior to visit.   Review of Systems All otherwise neg per pt     Objective:   Physical Exam BP 110/74 mmHg  Pulse 81  Temp(Src) 98.2 F (36.8 C) (Oral)  Ht 5\' 6"  (1.676 m)  Wt 145 lb 4 oz (65.885 kg)  BMI 23.46 kg/m2  SpO2 94% VS noted, nontoxic Constitutional: Pt appears well-developed, well-nourished.  HENT: Head: NCAT.  Right Ear: External ear normal.  Left Ear: External ear normal.  Eyes: . Pupils are equal, round, and reactive to light. Conjunctivae and EOM are normal Neck: Normal range of motion. Neck supple.  Cardiovascular: Normal rate and regular rhythm.   Pulmonary/Chest: Effort normal and breath sounds without rales or wheezing.  Abd:  Soft,  ND, + BS with mild low mid abd tender, no flank tender, spine nontender Neurological: Pt  is alert. Not confused , motor grossly intact Skin: Skin is warm. No rash Psychiatric: Pt behavior is normal. No agitation.     Assessment & Plan:

## 2014-02-28 NOTE — Telephone Encounter (Signed)
This should be forwarded to triage nurse, as we have no openings here. May need to go to [facility].

## 2014-02-28 NOTE — Patient Instructions (Signed)
Please take all new medication as prescribed - the antibiotic  Your specimen has been sent for cutlure, and the results should be ready in 2-3 days  Please continue all other medications as before, and refills have been done if requested.  Please have the pharmacy call with any other refills you may need.  Please keep your appointments with your specialists as you may have planned

## 2014-02-28 NOTE — Telephone Encounter (Signed)
Pt called states she is having vaginal burning and low back pain.  States she has been using over the counter Monistat with no relief.  Pt is requesting to either be seen this afternoon or to leave UA after 1:30pm.  Please advise

## 2014-02-28 NOTE — Telephone Encounter (Signed)
Spoke with pt, she is to see Dr Jenny Reichmann today at 2:45

## 2014-03-01 DIAGNOSIS — R300 Dysuria: Secondary | ICD-10-CM | POA: Insufficient documentation

## 2014-03-01 NOTE — Assessment & Plan Note (Signed)
Recent UA with micro reviewed with pt; + for Hgb on dip but 0-2 RBC; no gross hematuria, I d/w pt issue of Hgb on dip false pos occas due to myoglobin which also turns dip + for "hgb", micro neg, so may wish to revisit the urology referral with PCP

## 2014-03-01 NOTE — Assessment & Plan Note (Signed)
C/w prob cystitis acute without hematuria, Mild to mod, for antibx course,  to f/u any worsening symptoms or concerns

## 2014-03-03 LAB — URINE CULTURE

## 2014-09-12 ENCOUNTER — Encounter: Payer: Self-pay | Admitting: Internal Medicine

## 2014-09-12 ENCOUNTER — Other Ambulatory Visit: Payer: Self-pay | Admitting: Internal Medicine

## 2014-09-14 MED ORDER — VIVELLE-DOT 0.05 MG/24HR TD PTTW: MEDICATED_PATCH | TRANSDERMAL | Status: DC

## 2014-11-28 ENCOUNTER — Other Ambulatory Visit (HOSPITAL_COMMUNITY)
Admission: RE | Admit: 2014-11-28 | Discharge: 2014-11-28 | Disposition: A | Discharge status: Home / Self Care | Payer: 59 | Source: Ambulatory Visit | Attending: Family Medicine | Admitting: Family Medicine

## 2014-11-28 ENCOUNTER — Encounter: Payer: Self-pay | Admitting: Family Medicine

## 2014-11-28 ENCOUNTER — Ambulatory Visit (INDEPENDENT_AMBULATORY_CARE_PROVIDER_SITE_OTHER): Payer: 59 | Admitting: Family Medicine

## 2014-11-28 VITALS — BP 124/72 | HR 87 | Temp 97.7°F | Ht 66.0 in | Wt 143.0 lb

## 2014-11-28 DIAGNOSIS — Z01419 Encounter for gynecological examination (general) (routine) without abnormal findings: Secondary | ICD-10-CM | POA: Insufficient documentation

## 2014-11-28 DIAGNOSIS — R1030 Lower abdominal pain, unspecified: Secondary | ICD-10-CM | POA: Diagnosis not present

## 2014-11-28 DIAGNOSIS — Z113 Encounter for screening for infections with a predominantly sexual mode of transmission: Secondary | ICD-10-CM | POA: Diagnosis present

## 2014-11-28 DIAGNOSIS — R399 Unspecified symptoms and signs involving the genitourinary system: Secondary | ICD-10-CM | POA: Diagnosis not present

## 2014-11-28 LAB — HM PAP SMEAR: HM Pap smear: NORMAL

## 2014-11-28 LAB — CBC
HEMATOCRIT: 40.5 % (ref 36.0–46.0)
Hemoglobin: 13.6 g/dL (ref 12.0–15.0)
MCH: 31.3 pg (ref 26.0–34.0)
MCHC: 33.6 g/dL (ref 30.0–36.0)
MCV: 93.1 fL (ref 78.0–100.0)
MPV: 10.9 fL (ref 8.6–12.4)
PLATELETS: 228 10*3/uL (ref 150–400)
RBC: 4.35 MIL/uL (ref 3.87–5.11)
RDW: 12.8 % (ref 11.5–15.5)
WBC: 5.6 10*3/uL (ref 4.0–10.5)

## 2014-11-28 LAB — POCT URINALYSIS DIPSTICK
Bilirubin, UA: NEGATIVE
Glucose, UA: NEGATIVE
KETONES UA: NEGATIVE
Leukocytes, UA: NEGATIVE
Nitrite, UA: NEGATIVE
Protein, UA: NEGATIVE
Spec Grav, UA: 1.015
Urobilinogen, UA: 0.2
pH, UA: 5

## 2014-11-28 NOTE — Progress Notes (Signed)
Pre visit review using our clinic review tool, if applicable. No additional management support is needed unless otherwise documented below in the visit note. 

## 2014-11-28 NOTE — Patient Instructions (Addendum)
Nice to meet you. We will check for a urine infection, vaginal infection, and check your WBC for signs of infection. If you have worsening symptoms, abdominal pain, nausea, vomiting, fever, diarrhea, blood in your stool, or feel poorly please seek medical attention.   Abdominal Pain, Adult Many things can cause abdominal pain. Usually, abdominal pain is not caused by a disease and will improve without treatment. It can often be observed and treated at home. Your health care provider will do a physical exam and possibly order blood tests and X-rays to help determine the seriousness of your pain. However, in many cases, more time must pass before a clear cause of the pain can be found. Before that point, your health care provider may not know if you need more testing or further treatment. HOME CARE INSTRUCTIONS Monitor your abdominal pain for any changes. The following actions may help to alleviate any discomfort you are experiencing:  Only take over-the-counter or prescription medicines as directed by your health care provider.  Do not take laxatives unless directed to do so by your health care provider.  Try a clear liquid diet (broth, tea, or water) as directed by your health care provider. Slowly move to a bland diet as tolerated. SEEK MEDICAL CARE IF:  You have unexplained abdominal pain.  You have abdominal pain associated with nausea or diarrhea.  You have pain when you urinate or have a bowel movement.  You experience abdominal pain that wakes you in the night.  You have abdominal pain that is worsened or improved by eating food.  You have abdominal pain that is worsened with eating fatty foods.  You have a fever. SEEK IMMEDIATE MEDICAL CARE IF:  Your pain does not go away within 2 hours.  You keep throwing up (vomiting).  Your pain is felt only in portions of the abdomen, such as the right side or the left lower portion of the abdomen.  You pass bloody or black tarry  stools. MAKE SURE YOU:  Understand these instructions.  Will watch your condition.  Will get help right away if you are not doing well or get worse.   This information is not intended to replace advice given to you by your health care provider. Make sure you discuss any questions you have with your health care provider.   Document Released: 11/17/2004 Document Revised: 10/29/2014 Document Reviewed: 10/17/2012 Elsevier Interactive Patient Education [employer].

## 2014-11-29 DIAGNOSIS — R102 Pelvic and perineal pain: Secondary | ICD-10-CM | POA: Insufficient documentation

## 2014-11-29 LAB — URINALYSIS, MICROSCOPIC ONLY
BACTERIA UA: NONE SEEN [HPF]
CASTS: NONE SEEN [LPF]
Crystals: NONE SEEN [HPF]
RBC / HPF: NONE SEEN RBC/HPF (ref <=2)
YEAST: NONE SEEN [HPF]

## 2014-11-29 NOTE — Assessment & Plan Note (Signed)
Patient with several days of bilateral suprapubic discomfort. Has benign abdominal exam today. Pelvic exam with moderate discharge, though no tenderness or masses. UA not convincing for UTI. Could be related to BV/yeast/STI leading to vaginal irritation and contributing to her symptoms. She does not have her uterus or ovaries so can not be PID, pregnancy, or uterine/ovarian cause. Could be related to viral illness or food borne illness with her diarrhea yesterday, though also reports she has IBS which could be the cause as well. Lack of tenderness on exam and well appearance make diverticulitis and appendicitis unlikely causes. Stable vitals and WWP LE makes vascular cause unlikely. Patient is well appearing and vitals are stable. Will check UCx and urine micro. Wet prep and GC/chlamydia. Given undifferentiated abdominal discomfort will check CBC to evaluate WBC. Discussed with the patient that there is not an evident specific cause and that we would obtain the above lab work to evaluate for cause. Given return precautions.

## 2014-11-29 NOTE — Progress Notes (Signed)
Patient ID: Ashley Contreras, female   DOB: 11/05/1960, 54 y.o.   MRN: [id number]  Ashley Rumps, MD Phone: [phone]  Ashley Contreras is a 54 y.o. female who presents today for same day visit.  Suprapubic discomfort: patient notes several days of suprapubic discomfort bilaterally. Is intermittent. Has had frequency and urgency of urination with this. No dysuria. Mild right low back pain with this. No vaginal discharge. No vaginal bleeding. Notes she had hysterectomy and BSO, though they left her cervix. Is sexually active with only her husband. Has had some diarrhea yesterday with no blood. Mild nausea yesterday. No vomiting. No nausea or diarrhea today. Has a history of diverticulosis. No fevers. Feels well. No abdominal pain at this time. No numbness, weakness, saddle anesthesia, incontinence, or history of cancer. Had hysto and BSO for endometriosis.   PMH: nonsmoker.   ROS see HPI  Objective  Physical Exam Filed Vitals:   11/28/14 1536  BP: 124/72  Pulse: 87  Temp: 97.7 F (36.5 C)    Physical Exam  Constitutional: She is well-developed, well-nourished, and in no distress.  HENT:  Head: Normocephalic and atraumatic.  Right Ear: External ear normal.  Left Ear: External ear normal.  Mouth/Throat: Oropharynx is clear and moist.  Eyes: Conjunctivae are normal. Pupils are equal, round, and reactive to light.  Cardiovascular: Normal rate, regular rhythm and normal heart sounds.  Exam reveals no gallop and no friction rub.   No murmur heard. Pulmonary/Chest: Effort normal and breath sounds normal. No respiratory distress. She has no wheezes. She has no rales.  Abdominal: Soft. Bowel sounds are normal. She exhibits no distension and no mass. There is no tenderness. There is no rebound and no guarding.  No pulsatile mass  Genitourinary:  Speculum exam performed revealing normal vaginal mucosa with moderate amount of creamy white discharge, despite speculum being repositioned  numerous times and a fox swab being used I was unable to get the cervix to come in to view (patient reports that it has previously been difficult to get this in to view as her cervix is tilted), there was no bleeding, bimanual exam revealed cervix as freely mobile with no palpable abnormalities and no cervical motion tenderness, no adnexal tenderness or masses noted  Musculoskeletal: She exhibits no edema.  No midline spine tenderness, minimal right SI joint tenderness, no erythema, no swelling, no other tenderness in back, LE WWP  Neurological: She is alert.  5/5 strength in bilateral quads, hamstrings, plantar and dorsiflexion, sensation to light touch intact in bilateral LE, normal gait, 2+ patellar reflexes  Skin: Skin is warm and dry. She is not diaphoretic.     Assessment/Plan: Please see individual problem list.  Suprapubic discomfort Patient with several days of bilateral suprapubic discomfort. Has benign abdominal exam today. Pelvic exam with moderate discharge, though no tenderness or masses. UA not convincing for UTI. Could be related to BV/yeast/STI leading to vaginal irritation and contributing to her symptoms. She does not have her uterus or ovaries so can not be PID, pregnancy, or uterine/ovarian cause. Could be related to viral illness or food borne illness with her diarrhea yesterday, though also reports she has IBS which could be the cause as well. Lack of tenderness on exam and well appearance make diverticulitis and appendicitis unlikely causes. Stable vitals and WWP LE makes vascular cause unlikely. Patient is well appearing and vitals are stable. Will check UCx and urine micro. Wet prep and GC/chlamydia. Given undifferentiated abdominal discomfort will check CBC to  evaluate WBC. Discussed with the patient that there is not an evident specific cause and that we would obtain the above lab work to evaluate for cause. Given return precautions.     Orders Placed This Encounter    Procedures  . Urine Culture  . WET PREP FOR TRICH, YEAST, CLUE  . CBC  . Urine Microscopic Only  . POCT urinalysis dipstick    Ashley Contreras

## 2014-12-01 LAB — WET PREP BY MOLECULAR PROBE
CANDIDA SPECIES: NEGATIVE
GARDNERELLA VAGINALIS: POSITIVE — AB
Trichomonas vaginosis: NEGATIVE

## 2014-12-02 ENCOUNTER — Other Ambulatory Visit: Payer: Self-pay | Admitting: Family Medicine

## 2014-12-02 ENCOUNTER — Encounter: Payer: Self-pay | Admitting: Family Medicine

## 2014-12-02 LAB — URINE CULTURE
COLONY COUNT: NO GROWTH
Organism ID, Bacteria: NO GROWTH

## 2014-12-02 LAB — CYTOLOGY - PAP

## 2014-12-02 MED ORDER — METRONIDAZOLE 500 MG PO TABS: 500.0000 mg | ORAL_TABLET | Freq: Two times a day (BID) | ORAL | Status: DC

## 2014-12-08 ENCOUNTER — Encounter: Payer: 59 | Admitting: Internal Medicine

## 2014-12-12 ENCOUNTER — Encounter: Payer: 59 | Admitting: Internal Medicine

## 2014-12-17 LAB — HM MAMMOGRAPHY: HM MAMMO: NEGATIVE

## 2014-12-19 ENCOUNTER — Ambulatory Visit (INDEPENDENT_AMBULATORY_CARE_PROVIDER_SITE_OTHER): Payer: 59 | Admitting: Internal Medicine

## 2014-12-19 ENCOUNTER — Encounter: Payer: Self-pay | Admitting: Internal Medicine

## 2014-12-19 VITALS — BP 101/68 | HR 86 | Temp 98.0°F | Ht 65.5 in | Wt 139.4 lb

## 2014-12-19 DIAGNOSIS — Z0000 Encounter for general adult medical examination without abnormal findings: Secondary | ICD-10-CM | POA: Diagnosis not present

## 2014-12-19 LAB — COMPREHENSIVE METABOLIC PANEL
ALK PHOS: 63 U/L (ref 39–117)
ALT: 11 U/L (ref 0–35)
AST: 13 U/L (ref 0–37)
Albumin: 4.2 g/dL (ref 3.5–5.2)
BILIRUBIN TOTAL: 0.3 mg/dL (ref 0.2–1.2)
BUN: 16 mg/dL (ref 6–23)
CALCIUM: 9.1 mg/dL (ref 8.4–10.5)
CO2: 26 meq/L (ref 19–32)
CREATININE: 0.62 mg/dL (ref 0.40–1.20)
Chloride: 103 mEq/L (ref 96–112)
GFR: 106.6 mL/min (ref >60.00)
Glucose, Bld: 104 mg/dL — ABNORMAL HIGH (ref 70–99)
Potassium: 4.2 mEq/L (ref 3.5–5.1)
SODIUM: 139 meq/L (ref 135–145)
TOTAL PROTEIN: 6.6 g/dL (ref 6.0–8.3)

## 2014-12-19 LAB — LIPID PANEL
CHOL/HDL RATIO: 3
Cholesterol: 196 mg/dL (ref 0–200)
HDL: 57 mg/dL (ref >39.00)
LDL Cholesterol: 105 mg/dL — ABNORMAL HIGH (ref 0–99)
NONHDL: 138.93
TRIGLYCERIDES: 170 mg/dL — AB (ref 0.0–149.0)
VLDL: 34 mg/dL (ref 0.0–40.0)

## 2014-12-19 LAB — VITAMIN D 25 HYDROXY (VIT D DEFICIENCY, FRACTURES): VITD: 26.59 ng/mL — ABNORMAL LOW (ref 30.00–100.00)

## 2014-12-19 LAB — VITAMIN B12: VITAMIN B 12: 249 pg/mL (ref 211–911)

## 2014-12-19 LAB — TSH: TSH: 0.31 u[IU]/mL — AB (ref 0.35–4.50)

## 2014-12-19 LAB — HEMOGLOBIN A1C: HEMOGLOBIN A1C: 5.4 % (ref 4.6–6.5)

## 2014-12-19 MED ORDER — PANTOPRAZOLE SODIUM 40 MG PO TBEC: 40.0000 mg | DELAYED_RELEASE_TABLET | Freq: Two times a day (BID) | ORAL | Status: DC

## 2014-12-19 MED ORDER — VALACYCLOVIR HCL 1 G PO TABS: ORAL_TABLET | ORAL | Status: DC

## 2014-12-19 NOTE — Progress Notes (Signed)
Subjective:    Patient ID: Ashley Contreras, female    DOB: [birth year], 54 y.o.   MRN: [id number]  HPI  54YO female presents for annual exam.  OB visit last week with PAP, mammogram and bone density. Recently treated here for BV.  Also discussed pelvic pressure with OB. Symptoms of dull pressure in right lower abdomen have now resolved. Referral in place to urogynecology at [facility] for vaginal prolapse.  Wt Readings from Last 3 Encounters:  12/19/14 139 lb 6 oz (63.22 kg)  11/28/14 143 lb (64.864 kg)  02/28/14 145 lb 4 oz (65.885 kg)   BP Readings from Last 3 Encounters:  12/19/14 101/68  11/28/14 124/72  02/28/14 110/74    Past Medical History  Diagnosis Date  . Asthma   . Arthritis   . Chicken pox   . GERD (gastroesophageal reflux disease)   . Allergy     hay fever  . Colon polyp   . UTI (lower urinary tract infection)   . Mitral valve prolapse   . Abnormal Pap smear 1989    s/p colposcopy  . Shingles     waist, right side   Family History  Problem Relation Age of Onset  . Arthritis Mother   . Cancer Mother 35    breast  . Hypertension Mother   . Mental illness Mother   . Diabetes Mother   . Hyperlipidemia Father   . Heart disease Father   . Cancer Father     lung  . Mental illness Sister   . Myasthenia gravis Sister   . Stroke Maternal Aunt   . Cancer Maternal Aunt     breast  . Stroke Maternal Grandmother   . Hypertension Maternal Grandmother   . Diabetes Maternal Grandmother   . Cancer Sister     breast  . Multiple sclerosis Sister   . Multiple sclerosis Other    Past Surgical History  Procedure Laterality Date  . Abdominal hysterectomy  2008  . Tonsillectomy and adenoidectomy  2008  . Calcaneous osteotomy  06/2011   Social History   Social History  . Marital Status: Married    Spouse Name: N/A  . Number of Children: 2  . Years of Education: N/A   Occupational History  . [employer]   Social History Main Topics  .  Smoking status: Never Smoker   . Smokeless tobacco: Never Used  . Alcohol Use: No  . Drug Use: No  . Sexual Activity: Not Asked   Other Topics Concern  . None   Social History Narrative   Lives in [place], 2 children. No pets.      Work - [employer]      Diet - regular, healthy   Exercise -bike sometimes, limited by injury    Review of Systems  Constitutional: Negative for fever, chills, appetite change, fatigue and unexpected weight change.  Eyes: Negative for visual disturbance.  Respiratory: Negative for shortness of breath.   Cardiovascular: Negative for chest pain and leg swelling.  Gastrointestinal: Positive for abdominal pain. Negative for nausea, vomiting, diarrhea, constipation and abdominal distention.  Musculoskeletal: Negative for myalgias and arthralgias.  Skin: Negative for color change and rash.  Hematological: Negative for adenopathy. Does not bruise/bleed easily.  Psychiatric/Behavioral: Negative for sleep disturbance and dysphoric mood. The patient is not nervous/anxious.        Objective:    BP 101/68 mmHg  Pulse 86  Temp(Src) 98 F (36.7 C) (Oral)  Ht  5' 5.5" (1.664 m)  Wt 139 lb 6 oz (63.22 kg)  BMI 22.83 kg/m2  SpO2 97% Physical Exam  Constitutional: She is oriented to person, place, and time. She appears well-developed and well-nourished. No distress.  HENT:  Head: Normocephalic and atraumatic.  Right Ear: External ear normal.  Left Ear: External ear normal.  Nose: Nose normal.  Mouth/Throat: Oropharynx is clear and moist. No oropharyngeal exudate.  Eyes: Conjunctivae and EOM are normal. Pupils are equal, round, and reactive to light. Right eye exhibits no discharge.  Neck: Normal range of motion. Neck supple. No thyromegaly present.  Cardiovascular: Normal rate, regular rhythm, normal heart sounds and intact distal pulses.  Exam reveals no gallop and no friction rub.   No murmur heard. Pulmonary/Chest: Effort normal. No respiratory  distress. She has no wheezes. She has no rales.  Abdominal: Soft. Bowel sounds are normal. She exhibits no distension and no mass. There is no tenderness. There is no rebound and no guarding.  Musculoskeletal: Normal range of motion. She exhibits no edema or tenderness.  Lymphadenopathy:    She has no cervical adenopathy.  Neurological: She is alert and oriented to person, place, and time. No cranial nerve deficit. Coordination normal.  Skin: Skin is warm and dry. No rash noted. She is not diaphoretic. No erythema. No pallor.  Psychiatric: She has a normal mood and affect. Her behavior is normal. Judgment and thought content normal.          Assessment & Plan:   Problem List Items Addressed This Visit      Unprioritized   Routine general medical examination at a health care facility - Primary    General medical exam normal today. Breast and pelvic exam deferred as recently completed by OB. PAP here 10/7 was normal. Mammogram completed by OB, will request results. Colonoscopy is UTD. Labs as ordered. Flu vaccine today. Follow up in 1 year and prn.      Relevant Orders   Comprehensive metabolic panel   Lipid panel   Vit D  25 hydroxy (rtn osteoporosis monitoring)   B12   TSH   Hemoglobin A1c       Return in about 1 year (around 12/19/2015) for Physical.

## 2014-12-19 NOTE — Patient Instructions (Signed)
Health Maintenance, Female Adopting a healthy lifestyle and getting preventive care can go a long way to promote health and wellness. Talk with your health care provider about what schedule of regular examinations is right for you. This is a good chance for you to check in with your provider about disease prevention and staying healthy. In between checkups, there are plenty of things you can do on your own. Experts have done a lot of research about which lifestyle changes and preventive measures are most likely to keep you healthy. Ask your health care provider for more information. WEIGHT AND DIET  Eat a healthy diet  Be sure to include plenty of vegetables, fruits, low-fat dairy products, and lean protein.  Do not eat a lot of foods high in solid fats, added sugars, or salt.  Get regular exercise. This is one of the most important things you can do for your health.  Most adults should exercise for at least 150 minutes each week. The exercise should increase your heart rate and make you sweat (moderate-intensity exercise).  Most adults should also do strengthening exercises at least twice a week. This is in addition to the moderate-intensity exercise.  Maintain a healthy weight  Body mass index (BMI) is a measurement that can be used to identify possible weight problems. It estimates body fat based on height and weight. Your health care provider can help determine your BMI and help you achieve or maintain a healthy weight.  For females 20 years of age and older:   A BMI below 18.5 is considered underweight.  A BMI of 18.5 to 24.9 is normal.  A BMI of 25 to 29.9 is considered overweight.  A BMI of 30 and above is considered obese.  Watch levels of cholesterol and blood lipids  You should start having your blood tested for lipids and cholesterol at 54 years of age, then have this test every 5 years.  You may need to have your cholesterol levels checked more often if:  Your lipid  or cholesterol levels are high.  You are older than 54 years of age.  You are at high risk for heart disease.  CANCER SCREENING   Lung Cancer  Lung cancer screening is recommended for adults 55-80 years old who are at high risk for lung cancer because of a history of smoking.  A yearly low-dose CT scan of the lungs is recommended for people who:  Currently smoke.  Have quit within the past 15 years.  Have at least a 30-pack-year history of smoking. A pack year is smoking an average of one pack of cigarettes a day for 1 year.  Yearly screening should continue until it has been 15 years since you quit.  Yearly screening should stop if you develop a health problem that would prevent you from having lung cancer treatment.  Breast Cancer  Practice breast self-awareness. This means understanding how your breasts normally appear and feel.  It also means doing regular breast self-exams. Let your health care provider know about any changes, no matter how small.  If you are in your 20s or 30s, you should have a clinical breast exam (CBE) by a health care provider every 1-3 years as part of a regular health exam.  If you are 40 or older, have a CBE every year. Also consider having a breast X-ray (mammogram) every year.  If you have a family history of breast cancer, talk to your health care provider about genetic screening.  If you   are at high risk for breast cancer, talk to your health care provider about having an MRI and a mammogram every year.  Breast cancer gene (BRCA) assessment is recommended for women who have family members with BRCA-related cancers. BRCA-related cancers include:  Breast.  Ovarian.  Tubal.  Peritoneal cancers.  Results of the assessment will determine the need for genetic counseling and BRCA1 and BRCA2 testing. Cervical Cancer Your health care provider may recommend that you be screened regularly for cancer of the pelvic organs (ovaries, uterus, and  vagina). This screening involves a pelvic examination, including checking for microscopic changes to the surface of your cervix (Pap test). You may be encouraged to have this screening done every 3 years, beginning at age 21.  For women ages 30-65, health care providers may recommend pelvic exams and Pap testing every 3 years, or they may recommend the Pap and pelvic exam, combined with testing for human papilloma virus (HPV), every 5 years. Some types of HPV increase your risk of cervical cancer. Testing for HPV may also be done on women of any age with unclear Pap test results.  Other health care providers may not recommend any screening for nonpregnant women who are considered low risk for pelvic cancer and who do not have symptoms. Ask your health care provider if a screening pelvic exam is right for you.  If you have had past treatment for cervical cancer or a condition that could lead to cancer, you need Pap tests and screening for cancer for at least 20 years after your treatment. If Pap tests have been discontinued, your risk factors (such as having a new sexual partner) need to be reassessed to determine if screening should resume. Some women have medical problems that increase the chance of getting cervical cancer. In these cases, your health care provider may recommend more frequent screening and Pap tests. Colorectal Cancer  This type of cancer can be detected and often prevented.  Routine colorectal cancer screening usually begins at 54 years of age and continues through 54 years of age.  Your health care provider may recommend screening at an earlier age if you have risk factors for colon cancer.  Your health care provider may also recommend using home test kits to check for hidden blood in the stool.  A small camera at the end of a tube can be used to examine your colon directly (sigmoidoscopy or colonoscopy). This is done to check for the earliest forms of colorectal  cancer.  Routine screening usually begins at age 50.  Direct examination of the colon should be repeated every 5-10 years through 54 years of age. However, you may need to be screened more often if early forms of precancerous polyps or small growths are found. Skin Cancer  Check your skin from head to toe regularly.  Tell your health care provider about any new moles or changes in moles, especially if there is a change in a mole's shape or color.  Also tell your health care provider if you have a mole that is larger than the size of a pencil eraser.  Always use sunscreen. Apply sunscreen liberally and repeatedly throughout the day.  Protect yourself by wearing long sleeves, pants, a wide-brimmed hat, and sunglasses whenever you are outside. HEART DISEASE, DIABETES, AND HIGH BLOOD PRESSURE   High blood pressure causes heart disease and increases the risk of stroke. High blood pressure is more likely to develop in:  People who have blood pressure in the high end   of the normal range (130-139/85-89 mm Hg).  People who are overweight or obese.  People who are African American.  If you are 38-23 years of age, have your blood pressure checked every 3-5 years. If you are 61 years of age or older, have your blood pressure checked every year. You should have your blood pressure measured twice--once when you are at a hospital or clinic, and once when you are not at a hospital or clinic. Record the average of the two measurements. To check your blood pressure when you are not at a hospital or clinic, you can use:  An automated blood pressure machine at a pharmacy.  A home blood pressure monitor.  If you are between 45 years and 39 years old, ask your health care provider if you should take aspirin to prevent strokes.  Have regular diabetes screenings. This involves taking a blood sample to check your fasting blood sugar level.  If you are at a normal weight and have a low risk for diabetes,  have this test once every three years after 54 years of age.  If you are overweight and have a high risk for diabetes, consider being tested at a younger age or more often. PREVENTING INFECTION  Hepatitis B  If you have a higher risk for hepatitis B, you should be screened for this virus. You are considered at high risk for hepatitis B if:  You were born in a country where hepatitis B is common. Ask your health care provider which countries are considered high risk.  Your parents were born in a high-risk country, and you have not been immunized against hepatitis B (hepatitis B vaccine).  You have HIV or AIDS.  You use needles to inject street drugs.  You live with someone who has hepatitis B.  You have had sex with someone who has hepatitis B.  You get hemodialysis treatment.  You take certain medicines for conditions, including cancer, organ transplantation, and autoimmune conditions. Hepatitis C  Blood testing is recommended for:  Everyone born from [birth year] through 1965.  Anyone with known risk factors for hepatitis C. Sexually transmitted infections (STIs)  You should be screened for sexually transmitted infections (STIs) including gonorrhea and chlamydia if:  You are sexually active and are younger than 54 years of age.  You are older than 53 years of age and your health care provider tells you that you are at risk for this type of infection.  Your sexual activity has changed since you were last screened and you are at an increased risk for chlamydia or gonorrhea. Ask your health care provider if you are at risk.  If you do not have HIV, but are at risk, it may be recommended that you take a prescription medicine daily to prevent HIV infection. This is called pre-exposure prophylaxis (PrEP). You are considered at risk if:  You are sexually active and do not regularly use condoms or know the HIV status of your partner(s).  You take drugs by injection.  You are sexually  active with a partner who has HIV. Talk with your health care provider about whether you are at high risk of being infected with HIV. If you choose to begin PrEP, you should first be tested for HIV. You should then be tested every 3 months for as long as you are taking PrEP.  PREGNANCY   If you are premenopausal and you may become pregnant, ask your health care provider about preconception counseling.  If you may  become pregnant, take 400 to 800 micrograms (mcg) of folic acid every day.  If you want to prevent pregnancy, talk to your health care provider about birth control (contraception). OSTEOPOROSIS AND MENOPAUSE   Osteoporosis is a disease in which the bones lose minerals and strength with aging. This can result in serious bone fractures. Your risk for osteoporosis can be identified using a bone density scan.  If you are 61 years of age or older, or if you are at risk for osteoporosis and fractures, ask your health care provider if you should be screened.  Ask your health care provider whether you should take a calcium or vitamin D supplement to lower your risk for osteoporosis.  Menopause may have certain physical symptoms and risks.  Hormone replacement therapy may reduce some of these symptoms and risks. Talk to your health care provider about whether hormone replacement therapy is right for you.  HOME CARE INSTRUCTIONS   Schedule regular health, dental, and eye exams.  Stay current with your immunizations.   Do not use any tobacco products including cigarettes, chewing tobacco, or electronic cigarettes.  If you are pregnant, do not drink alcohol.  If you are breastfeeding, limit how much and how often you drink alcohol.  Limit alcohol intake to no more than 1 drink per day for nonpregnant women. One drink equals 12 ounces of beer, 5 ounces of wine, or 1 ounces of hard liquor.  Do not use street drugs.  Do not share needles.  Ask your health care provider for help if  you need support or information about quitting drugs.  Tell your health care provider if you often feel depressed.  Tell your health care provider if you have ever been abused or do not feel safe at home.   This information is not intended to replace advice given to you by your health care provider. Make sure you discuss any questions you have with your health care provider.   Document Released: 08/23/2010 Document Revised: 02/28/2014 Document Reviewed: 01/09/2013 Elsevier Interactive Patient Education [employer].

## 2014-12-19 NOTE — Progress Notes (Signed)
Pre visit review using our clinic review tool, if applicable. No additional management support is needed unless otherwise documented below in the visit note. 

## 2014-12-19 NOTE — Assessment & Plan Note (Signed)
General medical exam normal today. Breast and pelvic exam deferred as recently completed by OB. PAP here 10/7 was normal. Mammogram completed by OB, will request results. Colonoscopy is UTD. Labs as ordered. Flu vaccine today. Follow up in 1 year and prn.

## 2014-12-22 ENCOUNTER — Telehealth: Payer: Self-pay | Admitting: Internal Medicine

## 2014-12-22 ENCOUNTER — Encounter: Payer: Self-pay | Admitting: Internal Medicine

## 2014-12-22 NOTE — Telephone Encounter (Signed)
Pt sent a My chart message wanting to know if she is due for her Hep C? Let me know please. Thank You!

## 2014-12-22 NOTE — Telephone Encounter (Signed)
She is due for Hepatitis C screening, it is just a lab draw.  You can schedule.   Dr. Gilford Rile, Please place order.  Thanks

## 2014-12-22 NOTE — Telephone Encounter (Signed)
Cant be added a yellow top wasn't drawn

## 2014-12-22 NOTE — Telephone Encounter (Signed)
Can you ask pt to return for Hep C testing? Or, we can order next time she is here

## 2014-12-22 NOTE — Telephone Encounter (Signed)
Ashley Contreras, Can you add a Hep C antibody onto blood already in lab?

## 2014-12-23 ENCOUNTER — Other Ambulatory Visit: Payer: Self-pay | Admitting: *Deleted

## 2014-12-23 DIAGNOSIS — Z0000 Encounter for general adult medical examination without abnormal findings: Secondary | ICD-10-CM

## 2014-12-23 NOTE — Telephone Encounter (Signed)
I added the lab order, you can schedule pt to come in

## 2014-12-23 NOTE — Telephone Encounter (Signed)
Ashley Contreras is it ok to sch pt for her Hep C? I don't see a order yet. Thank You!

## 2014-12-26 ENCOUNTER — Other Ambulatory Visit (INDEPENDENT_AMBULATORY_CARE_PROVIDER_SITE_OTHER): Payer: 59

## 2014-12-26 DIAGNOSIS — Z0000 Encounter for general adult medical examination without abnormal findings: Secondary | ICD-10-CM | POA: Diagnosis not present

## 2014-12-27 LAB — HEPATITIS C ANTIBODY: HCV AB: NEGATIVE

## 2014-12-30 ENCOUNTER — Other Ambulatory Visit: Payer: Self-pay | Admitting: Unknown Physician Specialty

## 2014-12-30 DIAGNOSIS — Z13820 Encounter for screening for osteoporosis: Secondary | ICD-10-CM

## 2015-01-13 ENCOUNTER — Ambulatory Visit
Admission: RE | Admit: 2015-01-13 | Discharge: 2015-01-13 | Disposition: A | Discharge status: Home / Self Care | Payer: 59 | Source: Ambulatory Visit | Attending: Unknown Physician Specialty | Admitting: Unknown Physician Specialty

## 2015-01-13 DIAGNOSIS — Z13820 Encounter for screening for osteoporosis: Secondary | ICD-10-CM | POA: Diagnosis not present

## 2015-01-13 DIAGNOSIS — Z780 Asymptomatic menopausal state: Secondary | ICD-10-CM | POA: Insufficient documentation

## 2015-01-20 ENCOUNTER — Other Ambulatory Visit: Payer: Self-pay | Admitting: Unknown Physician Specialty

## 2015-01-20 DIAGNOSIS — R1032 Left lower quadrant pain: Secondary | ICD-10-CM

## 2015-01-20 DIAGNOSIS — R1011 Right upper quadrant pain: Secondary | ICD-10-CM

## 2015-01-27 ENCOUNTER — Other Ambulatory Visit: Payer: 59

## 2015-01-27 ENCOUNTER — Ambulatory Visit: Payer: 59

## 2015-02-25 ENCOUNTER — Ambulatory Visit
Admission: RE | Admit: 2015-02-25 | Discharge: 2015-02-25 | Disposition: A | Discharge status: Home / Self Care | Payer: 59 | Source: Ambulatory Visit | Attending: Unknown Physician Specialty | Admitting: Unknown Physician Specialty

## 2015-02-25 DIAGNOSIS — R1011 Right upper quadrant pain: Secondary | ICD-10-CM | POA: Diagnosis not present

## 2015-02-25 DIAGNOSIS — R1032 Left lower quadrant pain: Secondary | ICD-10-CM

## 2015-02-25 DIAGNOSIS — R1031 Right lower quadrant pain: Secondary | ICD-10-CM | POA: Insufficient documentation

## 2015-02-25 MED ORDER — SINCALIDE 5 MCG IJ SOLR
0.0200 ug/kg | Freq: Once | INTRAMUSCULAR | Status: AC
  Administered 2015-02-25: 1.26 ug via INTRAVENOUS

## 2015-02-25 MED ORDER — TECHNETIUM TC 99M MEBROFENIN IV KIT
4.8720 | PACK | Freq: Once | INTRAVENOUS | Status: AC | PRN
  Administered 2015-02-25: 4.872 via INTRAVENOUS

## 2015-02-27 ENCOUNTER — Ambulatory Visit: Payer: Self-pay | Admitting: Physician Assistant

## 2015-02-27 ENCOUNTER — Encounter: Payer: Self-pay | Admitting: Physician Assistant

## 2015-02-27 VITALS — BP 118/74 | HR 80 | Temp 97.8°F

## 2015-02-27 DIAGNOSIS — J069 Acute upper respiratory infection, unspecified: Secondary | ICD-10-CM

## 2015-02-27 MED ORDER — HYDROCOD POLST-CPM POLST ER 10-8 MG/5ML PO SUER: 5.0000 mL | Freq: Two times a day (BID) | ORAL | Status: DC | PRN

## 2015-02-27 MED ORDER — AZITHROMYCIN 250 MG PO TABS: ORAL_TABLET | ORAL | Status: DC

## 2015-02-27 NOTE — Progress Notes (Signed)
S: C/o runny nose and congestion for 5 days, hoarse voice, dry cough, no fever, chills, cp/sob, v/d; mucus is green and thick, cough is sporadic, family members have also been sick  Using otc meds: dayquil  O: PE: vitals wnl, nad, perrl eomi, normocephalic, tms dull, nasal mucosa red and swollen, throat injected, neck supple no lymph, voice is hoarse lungs c t a, cv rrr, neuro intact, cough is dry and hacking  A:  Acute uri   P: zpack, tussionex; drink fluids, continue regular meds , use otc meds of choice, return if not improving in 5 days, return earlier if worsening

## 2015-03-04 DIAGNOSIS — R1031 Right lower quadrant pain: Secondary | ICD-10-CM | POA: Diagnosis not present

## 2015-03-04 DIAGNOSIS — R1032 Left lower quadrant pain: Secondary | ICD-10-CM | POA: Diagnosis not present

## 2015-03-24 ENCOUNTER — Encounter: Payer: Self-pay | Admitting: *Deleted

## 2015-04-03 DIAGNOSIS — M84374A Stress fracture, right foot, initial encounter for fracture: Secondary | ICD-10-CM | POA: Diagnosis not present

## 2015-04-03 DIAGNOSIS — M2021 Hallux rigidus, right foot: Secondary | ICD-10-CM | POA: Diagnosis not present

## 2015-04-03 DIAGNOSIS — M79671 Pain in right foot: Secondary | ICD-10-CM | POA: Diagnosis not present

## 2015-04-03 DIAGNOSIS — M21611 Bunion of right foot: Secondary | ICD-10-CM | POA: Diagnosis not present

## 2015-04-03 DIAGNOSIS — M25879 Other specified joint disorders, unspecified ankle and foot: Secondary | ICD-10-CM | POA: Diagnosis not present

## 2015-04-03 DIAGNOSIS — M8580 Other specified disorders of bone density and structure, unspecified site: Secondary | ICD-10-CM | POA: Diagnosis not present

## 2015-04-03 DIAGNOSIS — Z981 Arthrodesis status: Secondary | ICD-10-CM | POA: Diagnosis not present

## 2015-04-22 ENCOUNTER — Telehealth: Payer: Self-pay | Admitting: Internal Medicine

## 2015-04-22 DIAGNOSIS — R7989 Other specified abnormal findings of blood chemistry: Secondary | ICD-10-CM

## 2015-04-22 NOTE — Telephone Encounter (Signed)
Pt sch a appt for this Friday 03/03 and would like lab work to check TSH. Need orders please and thank you! Call pt @ [phone].

## 2015-04-23 NOTE — Telephone Encounter (Signed)
Lab appt made for 2:15 04/24/15

## 2015-04-23 NOTE — Telephone Encounter (Signed)
Pt would like lab orders for TSH to be checked. Please advise, thanks

## 2015-04-23 NOTE — Telephone Encounter (Signed)
Lab orders placed.  

## 2015-04-23 NOTE — Telephone Encounter (Signed)
[id number]

## 2015-04-24 ENCOUNTER — Encounter: Payer: Self-pay | Admitting: Internal Medicine

## 2015-04-24 ENCOUNTER — Ambulatory Visit (INDEPENDENT_AMBULATORY_CARE_PROVIDER_SITE_OTHER): Payer: 59 | Admitting: Internal Medicine

## 2015-04-24 ENCOUNTER — Other Ambulatory Visit: Payer: Self-pay

## 2015-04-24 VITALS — BP 111/72 | HR 74 | Temp 98.0°F | Ht 65.5 in | Wt 142.1 lb

## 2015-04-24 DIAGNOSIS — E0590 Thyrotoxicosis, unspecified without thyrotoxic crisis or storm: Secondary | ICD-10-CM | POA: Diagnosis not present

## 2015-04-24 DIAGNOSIS — N6320 Unspecified lump in the left breast, unspecified quadrant: Secondary | ICD-10-CM | POA: Insufficient documentation

## 2015-04-24 DIAGNOSIS — N63 Unspecified lump in breast: Secondary | ICD-10-CM | POA: Diagnosis not present

## 2015-04-24 DIAGNOSIS — R946 Abnormal results of thyroid function studies: Secondary | ICD-10-CM

## 2015-04-24 DIAGNOSIS — R7989 Other specified abnormal findings of blood chemistry: Secondary | ICD-10-CM

## 2015-04-24 LAB — T4, FREE: Free T4: 0.8 ng/dL (ref 0.60–1.60)

## 2015-04-24 LAB — TSH: TSH: 0.58 u[IU]/mL (ref 0.35–4.50)

## 2015-04-24 NOTE — Addendum Note (Signed)
Addended by: Nanci Pina on: 04/24/2015 02:01 PM   Modules accepted: Orders

## 2015-04-24 NOTE — Assessment & Plan Note (Signed)
Reviewed labs. Will repeat TSH and add Free T4. Follow up after labs.

## 2015-04-24 NOTE — Progress Notes (Signed)
Pre visit review using our clinic review tool, if applicable. No additional management support is needed unless otherwise documented below in the visit note. 

## 2015-04-24 NOTE — Patient Instructions (Signed)
Labs today.  We will set up diagnostic mammogram and ultrasound.  Follow up in 2 weeks.

## 2015-04-24 NOTE — Assessment & Plan Note (Signed)
Left breast mass. Strong family h/o breast cancer. Mammogram in 11/2014 normal. Will get diagnostic left mammogram and [place].

## 2015-04-24 NOTE — Progress Notes (Signed)
Subjective:    Patient ID: STEPHAN MCKERCHER, female    DOB: 1960-09-20, 55 y.o.   MRN: [id number]  HPI  55YO female presents for acute visit.  Breast mass - Found lump in left breast 2 weeks ago. Described as small sore knot behind nipple. No skin changes. No nipple discharge. No known injury.  Thyroid labs - Recent TSH was low at 0.3. She questions what this means.   Wt Readings from Last 3 Encounters:  04/24/15 142 lb 2 oz (64.467 kg)  12/19/14 139 lb 6 oz (63.22 kg)  11/28/14 143 lb (64.864 kg)   BP Readings from Last 3 Encounters:  04/24/15 111/72  02/27/15 118/74  12/19/14 101/68    Past Medical History  Diagnosis Date  . Asthma   . Arthritis   . Chicken pox   . GERD (gastroesophageal reflux disease)   . Allergy     hay fever  . Colon polyp   . UTI (lower urinary tract infection)   . Mitral valve prolapse   . Abnormal Pap smear 1989    s/p colposcopy  . Shingles     waist, right side   Family History  Problem Relation Age of Onset  . Arthritis Mother   . Cancer Mother 28    breast  . Hypertension Mother   . Mental illness Mother   . Diabetes Mother   . Hyperlipidemia Father   . Heart disease Father   . Cancer Father     lung  . Mental illness Sister   . Myasthenia gravis Sister   . Stroke Maternal Aunt   . Cancer Maternal Aunt     breast  . Stroke Maternal Grandmother   . Hypertension Maternal Grandmother   . Diabetes Maternal Grandmother   . Cancer Sister     breast  . Multiple sclerosis Sister   . Multiple sclerosis Other    Past Surgical History  Procedure Laterality Date  . Abdominal hysterectomy  2008  . Tonsillectomy and adenoidectomy  2008  . Calcaneous osteotomy  06/2011  . Breast biopsy      left, benign   Social History   Social History  . Marital Status: Married    Spouse Name: N/A  . Number of Children: 2  . Years of Education: N/A   Occupational History  . [employer]   Social History Main Topics  .  Smoking status: Never Smoker   . Smokeless tobacco: Never Used  . Alcohol Use: No  . Drug Use: No  . Sexual Activity: Not Asked   Other Topics Concern  . None   Social History Narrative   Lives in [place], 2 children. No pets.      Work - [employer]      Diet - regular, healthy   Exercise -bike sometimes, limited by injury    Review of Systems  Constitutional: Negative for fever, chills, appetite change, fatigue and unexpected weight change.  Eyes: Negative for visual disturbance.  Respiratory: Negative for shortness of breath.   Cardiovascular: Negative for chest pain and leg swelling.  Gastrointestinal: Negative for abdominal pain.  Skin: Negative for color change and rash.  Hematological: Negative for adenopathy. Does not bruise/bleed easily.  Psychiatric/Behavioral: Negative for dysphoric mood. The patient is not nervous/anxious.        Objective:    BP 111/72 mmHg  Pulse 74  Temp(Src) 98 F (36.7 C) (Oral)  Ht 5' 5.5" (1.664 m)  Wt 142 lb  2 oz (64.467 kg)  BMI 23.28 kg/m2  SpO2 100% Physical Exam  Constitutional: She is oriented to person, place, and time. She appears well-developed and well-nourished. No distress.  HENT:  Head: Normocephalic and atraumatic.  Right Ear: External ear normal.  Left Ear: External ear normal.  Nose: Nose normal.  Mouth/Throat: Oropharynx is clear and moist. No oropharyngeal exudate.  Eyes: Conjunctivae are normal. Pupils are equal, round, and reactive to light. Right eye exhibits no discharge. Left eye exhibits no discharge. No scleral icterus.  Neck: Normal range of motion. Neck supple. No tracheal deviation present. No thyromegaly present.  Cardiovascular: Normal rate, regular rhythm, normal heart sounds and intact distal pulses.  Exam reveals no gallop and no friction rub.   No murmur heard. Pulmonary/Chest: Effort normal and breath sounds normal. No tachypnea. She exhibits no tenderness. Right breast exhibits no inverted  nipple, no mass, no nipple discharge, no skin change and no tenderness. Left breast exhibits mass and tenderness (slight tenderness at site of breast nodule). Left breast exhibits no inverted nipple, no nipple discharge and no skin change. Breasts are symmetrical.    Musculoskeletal: Normal range of motion. She exhibits no edema or tenderness.  Lymphadenopathy:    She has no cervical adenopathy.  Neurological: She is alert and oriented to person, place, and time. No cranial nerve deficit. She exhibits normal muscle tone. Coordination normal.  Skin: Skin is warm and dry. No rash noted. She is not diaphoretic. No erythema. No pallor.  Psychiatric: She has a normal mood and affect. Her behavior is normal. Judgment and thought content normal.          Assessment & Plan:   Problem List Items Addressed This Visit      Unprioritized   Left breast mass - Primary    Left breast mass. Strong family h/o breast cancer. Mammogram in 11/2014 normal. Will get diagnostic left mammogram and [place].      Relevant Orders   US BREAST LTD UNI LEFT INC AXILLA   MM Digital Diagnostic Unilat L   Subclinical hyperthyroidism    Reviewed labs. Will repeat TSH and add Free T4. Follow up after labs.          Return in about 2 weeks (around 05/08/2015).  Ronette Deter, MD [facility]

## 2015-04-29 ENCOUNTER — Other Ambulatory Visit: Payer: Self-pay | Admitting: *Deleted

## 2015-04-29 ENCOUNTER — Inpatient Hospital Stay
Admission: RE | Admit: 2015-04-29 | Discharge: 2015-04-29 | Disposition: A | Discharge status: Home / Self Care | Payer: Self-pay | Source: Ambulatory Visit | Attending: *Deleted | Admitting: *Deleted

## 2015-04-29 DIAGNOSIS — Z9289 Personal history of other medical treatment: Secondary | ICD-10-CM

## 2015-04-29 DIAGNOSIS — M84374A Stress fracture, right foot, initial encounter for fracture: Secondary | ICD-10-CM | POA: Diagnosis not present

## 2015-04-29 DIAGNOSIS — M84374D Stress fracture, right foot, subsequent encounter for fracture with routine healing: Secondary | ICD-10-CM | POA: Diagnosis not present

## 2015-04-29 DIAGNOSIS — Y33XXXA Other specified events, undetermined intent, initial encounter: Secondary | ICD-10-CM | POA: Diagnosis not present

## 2015-05-12 ENCOUNTER — Ambulatory Visit
Admission: RE | Admit: 2015-05-12 | Discharge: 2015-05-12 | Disposition: A | Discharge status: Home / Self Care | Payer: 59 | Source: Ambulatory Visit | Attending: Internal Medicine | Admitting: Internal Medicine

## 2015-05-12 ENCOUNTER — Other Ambulatory Visit: Payer: Self-pay | Admitting: Internal Medicine

## 2015-05-12 DIAGNOSIS — N63 Unspecified lump in breast: Secondary | ICD-10-CM | POA: Diagnosis not present

## 2015-05-12 DIAGNOSIS — N6320 Unspecified lump in the left breast, unspecified quadrant: Secondary | ICD-10-CM

## 2015-05-12 DIAGNOSIS — N6042 Mammary duct ectasia of left breast: Secondary | ICD-10-CM | POA: Diagnosis not present

## 2015-06-03 DIAGNOSIS — R1013 Epigastric pain: Secondary | ICD-10-CM | POA: Diagnosis not present

## 2015-06-04 DIAGNOSIS — D2271 Melanocytic nevi of right lower limb, including hip: Secondary | ICD-10-CM | POA: Diagnosis not present

## 2015-06-04 DIAGNOSIS — D2262 Melanocytic nevi of left upper limb, including shoulder: Secondary | ICD-10-CM | POA: Diagnosis not present

## 2015-06-04 DIAGNOSIS — D225 Melanocytic nevi of trunk: Secondary | ICD-10-CM | POA: Diagnosis not present

## 2015-06-04 DIAGNOSIS — H02729 Madarosis of unspecified eye, unspecified eyelid and periocular area: Secondary | ICD-10-CM | POA: Diagnosis not present

## 2015-06-05 DIAGNOSIS — N393 Stress incontinence (female) (male): Secondary | ICD-10-CM | POA: Diagnosis not present

## 2015-06-05 DIAGNOSIS — R350 Frequency of micturition: Secondary | ICD-10-CM | POA: Diagnosis not present

## 2015-08-07 ENCOUNTER — Encounter: Payer: Self-pay | Admitting: Physician Assistant

## 2015-08-07 ENCOUNTER — Ambulatory Visit: Payer: Self-pay | Admitting: Physician Assistant

## 2015-08-07 VITALS — BP 100/64 | HR 76 | Temp 97.9°F

## 2015-08-07 DIAGNOSIS — R300 Dysuria: Secondary | ICD-10-CM

## 2015-08-07 DIAGNOSIS — R1031 Right lower quadrant pain: Secondary | ICD-10-CM

## 2015-08-07 DIAGNOSIS — M549 Dorsalgia, unspecified: Secondary | ICD-10-CM

## 2015-08-07 LAB — POCT URINALYSIS DIPSTICK
Bilirubin, UA: NEGATIVE
GLUCOSE UA: NEGATIVE
Ketones, UA: NEGATIVE
LEUKOCYTES UA: NEGATIVE
NITRITE UA: NEGATIVE
PROTEIN UA: NEGATIVE
SPEC GRAV UA: 1.025
UROBILINOGEN UA: 0.2
pH, UA: 5.5

## 2015-08-07 NOTE — Progress Notes (Signed)
S: c/o lower back and rlq pain since Tues, no fever/chills, no known injury, no v/d/constipation, ?if its uti, took tylenol which helped, pain seems to be worse with movement,  still has appendix  O: vitals wnl, nad, lumbar spine nontender, paravertebral muscles in lower back a little tender, abd soft mildly tender in rlq with deep palp, bs normal, n/v intact, ua wnl  A: back pain, rlq pain  P: wet heat, follow with ice, otc tylenol, f/u with pcp, go to ER if rlq pain worsening, discussed si and sx of appendicitis

## 2015-09-11 ENCOUNTER — Encounter: Payer: Self-pay | Admitting: Internal Medicine

## 2015-09-11 DIAGNOSIS — Z1239 Encounter for other screening for malignant neoplasm of breast: Secondary | ICD-10-CM

## 2015-11-16 DIAGNOSIS — J309 Allergic rhinitis, unspecified: Secondary | ICD-10-CM | POA: Diagnosis not present

## 2015-11-16 DIAGNOSIS — H6123 Impacted cerumen, bilateral: Secondary | ICD-10-CM | POA: Diagnosis not present

## 2015-11-16 DIAGNOSIS — H60541 Acute eczematoid otitis externa, right ear: Secondary | ICD-10-CM | POA: Diagnosis not present

## 2015-12-02 ENCOUNTER — Other Ambulatory Visit: Payer: Self-pay | Admitting: Family Medicine

## 2015-12-02 DIAGNOSIS — Z1231 Encounter for screening mammogram for malignant neoplasm of breast: Secondary | ICD-10-CM

## 2015-12-14 ENCOUNTER — Encounter: Payer: Self-pay | Admitting: Family Medicine

## 2015-12-15 ENCOUNTER — Other Ambulatory Visit: Payer: Self-pay | Admitting: Family Medicine

## 2015-12-15 DIAGNOSIS — R928 Other abnormal and inconclusive findings on diagnostic imaging of breast: Secondary | ICD-10-CM

## 2015-12-16 DIAGNOSIS — R1031 Right lower quadrant pain: Secondary | ICD-10-CM | POA: Diagnosis not present

## 2015-12-25 ENCOUNTER — Encounter: Payer: 59 | Admitting: Internal Medicine

## 2015-12-31 ENCOUNTER — Ambulatory Visit
Admission: RE | Admit: 2015-12-31 | Discharge: 2015-12-31 | Disposition: A | Discharge status: Home / Self Care | Payer: 59 | Source: Ambulatory Visit | Attending: Family Medicine | Admitting: Family Medicine

## 2015-12-31 DIAGNOSIS — Z1231 Encounter for screening mammogram for malignant neoplasm of breast: Secondary | ICD-10-CM | POA: Diagnosis not present

## 2016-01-29 ENCOUNTER — Other Ambulatory Visit (HOSPITAL_COMMUNITY)
Admission: RE | Admit: 2016-01-29 | Discharge: 2016-01-29 | Disposition: A | Discharge status: Home / Self Care | Payer: 59 | Source: Ambulatory Visit | Attending: Family Medicine | Admitting: Family Medicine

## 2016-01-29 ENCOUNTER — Ambulatory Visit (INDEPENDENT_AMBULATORY_CARE_PROVIDER_SITE_OTHER): Payer: 59 | Admitting: Family Medicine

## 2016-01-29 ENCOUNTER — Encounter: Payer: Self-pay | Admitting: Family Medicine

## 2016-01-29 VITALS — BP 114/68 | HR 82 | Temp 98.6°F | Ht 65.5 in | Wt 141.2 lb

## 2016-01-29 DIAGNOSIS — Z1151 Encounter for screening for human papillomavirus (HPV): Secondary | ICD-10-CM | POA: Insufficient documentation

## 2016-01-29 DIAGNOSIS — G8929 Other chronic pain: Secondary | ICD-10-CM | POA: Insufficient documentation

## 2016-01-29 DIAGNOSIS — R1031 Right lower quadrant pain: Secondary | ICD-10-CM | POA: Diagnosis not present

## 2016-01-29 DIAGNOSIS — Z124 Encounter for screening for malignant neoplasm of cervix: Secondary | ICD-10-CM | POA: Diagnosis not present

## 2016-01-29 DIAGNOSIS — R300 Dysuria: Secondary | ICD-10-CM

## 2016-01-29 DIAGNOSIS — Z01419 Encounter for gynecological examination (general) (routine) without abnormal findings: Secondary | ICD-10-CM | POA: Insufficient documentation

## 2016-01-29 DIAGNOSIS — R002 Palpitations: Secondary | ICD-10-CM | POA: Diagnosis not present

## 2016-01-29 DIAGNOSIS — Z0001 Encounter for general adult medical examination with abnormal findings: Secondary | ICD-10-CM

## 2016-01-29 DIAGNOSIS — Z13220 Encounter for screening for lipoid disorders: Secondary | ICD-10-CM

## 2016-01-29 DIAGNOSIS — E0590 Thyrotoxicosis, unspecified without thyrotoxic crisis or storm: Secondary | ICD-10-CM | POA: Diagnosis not present

## 2016-01-29 LAB — POCT URINALYSIS DIPSTICK
Bilirubin, UA: NEGATIVE
GLUCOSE UA: NEGATIVE
Ketones, UA: NEGATIVE
Leukocytes, UA: NEGATIVE
NITRITE UA: NEGATIVE
PH UA: 5.5
Protein, UA: NEGATIVE
Spec Grav, UA: 1.02
UROBILINOGEN UA: 0.2

## 2016-01-29 MED ORDER — NITROFURANTOIN MONOHYD MACRO 100 MG PO CAPS: 100.0000 mg | ORAL_CAPSULE | Freq: Two times a day (BID) | ORAL | 0 refills | Status: DC

## 2016-01-29 NOTE — Progress Notes (Signed)
Tommi Rumps, MD Phone: [phone]  Ashley Contreras is a 55 y.o. female who presents today for physical exam.  Diet is described as poor. Snacks a lot. Does not exercise. Is up-to-date on colonoscopy. Is due for recall in April 2018 and is aware of this. Pap smear up-to-date. She had a supracervical hysterectomy with removal of her ovaries. Up-to-date on mammography. No prior HIV testing. Flu shot earlier this year. Hepatitis C up-to-date. No tobacco use. Rare alcohol use. No illicit drug use. Sees a dentist and ophthalmologist.  Patient reports dysuria over the last week. No fevers. Notes frequency and urgency. No vaginal discharge. Notes no acute abdominal discomfort.  Patient notes chronic intermittent right lower quadrant discomfort. It is a dull ache. Comes and goes for weeks at a time. Occasionally has diarrhea with it. No weight loss. No vomiting or nausea. Has seen GI and they are unsure of the cause. Status post hysterectomy and BSO.  Patient additionally notes intermittent sensation of her heart racing. Does not last very long and does not happen very often. No associated chest pain or shortness of breath. Does have a history of subclinical hyperthyroidism. She can't remember the last time this occurred.  Active Ambulatory Problems    Diagnosis Date Noted  . Seasonal allergies 10/21/2011  . Cervical radiculopathy 11/03/2011  . Dyspareunia 04/25/2012  . GERD (gastroesophageal reflux disease) 09/21/2012  . Chronic gastritis 09/21/2012  . Personal history of colonic polyps 10/29/2012  . Encounter for general adult medical examination with abnormal findings 11/02/2012  . Dysuria 03/01/2014  . Left breast mass 04/24/2015  . Subclinical hyperthyroidism 04/24/2015  . Abdominal pain, chronic, right lower quadrant 01/29/2016  . Palpitations 01/29/2016   Resolved Ambulatory Problems    Diagnosis Date Noted  . Left facial numbness 10/21/2011  . Sinusitis 04/25/2012  .  Vaginal discharge 04/26/2012  . Dyspepsia 10/29/2012  . UTI (urinary tract infection) 06/04/2013  . Cold intolerance 06/04/2013  . Hematuria 08/23/2013  . Eczematous dermatitis of eyelid 09/03/2013  . Suprapubic discomfort 11/29/2014   Past Medical History:  Diagnosis Date  . Abnormal Pap smear 1989  . Allergy   . Arthritis   . Asthma   . Chicken pox   . Colon polyp   . GERD (gastroesophageal reflux disease)   . Mitral valve prolapse   . Shingles   . UTI (lower urinary tract infection)     Family History  Problem Relation Age of Onset  . Arthritis Mother   . Cancer Mother 64    breast  . Hypertension Mother   . Mental illness Mother   . Diabetes Mother   . Breast cancer Mother 62  . Hyperlipidemia Father   . Heart disease Father   . Cancer Father     lung  . Mental illness Sister   . Myasthenia gravis Sister   . Multiple sclerosis Sister   . Multiple sclerosis Other   . Stroke Maternal Aunt   . Cancer Maternal Aunt     breast  . Breast cancer Maternal Aunt   . Stroke Maternal Grandmother   . Hypertension Maternal Grandmother   . Diabetes Maternal Grandmother     Social History   Social History  . Marital status: Married    Spouse name: N/A  . Number of children: 2  . Years of education: N/A   Occupational History  . [employer]   Social History Main Topics  . Smoking status: Never Smoker  . Smokeless tobacco:  Never Used  . Alcohol use No  . Drug use: No  . Sexual activity: Not on file   Other Topics Concern  . Not on file   Social History Narrative   Lives in [place], 2 children. No pets.      Work - [employer]      Diet - regular, healthy   Exercise -bike sometimes, limited by injury    ROS  General:  Negative for nexplained weight loss, fever Skin: Negative for new or changing mole, sore that won't heal HEENT: Negative for trouble hearing, trouble seeing, ringing in ears, mouth sores, hoarseness, change in voice,  dysphagia. CV:  Positive for palpitations, Negative for chest pain, dyspnea, edema Resp: Negative for cough, dyspnea, hemoptysis GI: Positive for diarrhea, abdominal pain, Negative for nausea, vomiting, constipation, melena, hematochezia. GU: Positive for dysuria, negative for incontinence, urinary hesitance, hematuria, vaginal or penile discharge, polyuria, sexual difficulty, lumps in testicle or breasts MSK: Negative for muscle cramps or aches, joint pain or swelling Neuro: Negative for headaches, weakness, numbness, dizziness, passing out/fainting Psych: Negative for depression, anxiety, memory problems  Objective  Physical Exam Vitals:   01/29/16 1433  BP: 114/68  Pulse: 82  Temp: 98.6 F (37 C)    BP Readings from Last 3 Encounters:  01/29/16 114/68  08/07/15 100/64  04/24/15 111/72   Wt Readings from Last 3 Encounters:  01/29/16 141 lb 3.2 oz (64 kg)  04/24/15 142 lb 2 oz (64.5 kg)  12/19/14 139 lb 6 oz (63.2 kg)    Physical Exam  Constitutional: No distress.  HENT:  Head: Normocephalic and atraumatic.  Mouth/Throat: Oropharynx is clear and moist.  Eyes: Conjunctivae are normal. Pupils are equal, round, and reactive to light.  Cardiovascular: Normal rate, regular rhythm and normal heart sounds.   Pulmonary/Chest: Effort normal and breath sounds normal.  Abdominal: Soft. Bowel sounds are normal. She exhibits no distension. There is no tenderness. There is no rebound and no guarding.  Genitourinary: Vagina normal, cervix normal and left adnexa normal.  Genitourinary Comments: Mild discomfort on palpation of right adnexal area, bilateral breasts with no skin changes, nipple inversion, or masses palpated, no axillary masses bilaterally  Musculoskeletal: She exhibits no edema.  Neurological: She is alert. Gait normal.  Skin: Skin is warm and dry. She is not diaphoretic.  Psychiatric: Mood and affect normal.   EKG: Normal sinus rhythm, rate 75, no ST or T-wave  changes  Assessment/Plan:   Subclinical hyperthyroidism Check TSH.  Palpitations Patient with intermittent palpitations recently. Otherwise asymptomatic. Possibly related to her subclinical hyperthyroidism and we will evaluate this. Other lab work as outlined below. EKG done today is reassuring. She'll continue to monitor. Given return precautions.  Encounter for general adult medical examination with abnormal findings Patient with normal BMI. Has poor diet and exercise habits. Encouraged diet and exercise. Colonoscopy up-to-date. Mammography up-to-date. Vaccination status up-to-date. She declines HIV testing. We will check lab work as outlined below. Pap smear completed today at the patient's request. She would like to continue yearly Pap smears.  Dysuria Patient with symptoms consistent with UTI. UA with blood. Given UA findings and symptoms we will proceed with treatment for UTI. Treat with Macrobid. Send for culture and microscopy. Given return precautions.  Abdominal pain, chronic, right lower quadrant Patient with vague intermittent right lower quadrant discomfort over the last year. Mildly tender on right adnexal exam though no right lower quadrant tenderness today. She is status post hysterectomy and BSO. Given persistence we   will obtain a CT scan abdomen and pelvis to evaluate further. Could be related to IBS given that she has issues with this. Once CT scan returns we will determine next step in management. Given return precautions.   Orders Placed This Encounter  Procedures  . Urine Culture  . CT Abdomen Pelvis W Contrast    Standing Status:   Future    Standing Expiration Date:   04/28/2017    Order Specific Question:   If indicated for the ordered procedure, I authorize the administration of contrast media per Radiology protocol    Answer:   Yes    Order Specific Question:   Reason for Exam (SYMPTOM  OR DIAGNOSIS REQUIRED)    Answer:   chronic RLQ abdominal pain intermittent  over the past year    Order Specific Question:   Is patient pregnant?    Answer:   No    Order Specific Question:   Preferred imaging location?    Answer:   [facility]  . Comp Met (CM[facility])    Standing Status:   Future    Standing Expiration Date:   01/28/2017  . TSH    Standing Status:   Future    Standing Expiration Date:   01/28/2017  . CBC    Standing Status:   Future    Standing Expiration Date:   01/28/2017  . Urine Microscopic Only  . Lipid Profile    Standing Status:   Future    Standing Expiration Date:   01/28/2017  . POCT Urinalysis Dipstick  . EKG 12-Lead    Meds ordered this encounter  Medications  . CALCIUM GLUCONATE PO    Sig: Take 1 tablet by mouth daily.  . cholecalciferol (VITAMIN D) 1000 units tablet    Sig: Take 1,000 Units by mouth daily.  . nitrofurantoin, macrocrystal-monohydrate, (MACROBID) 100 MG capsule    Sig: Take 1 capsule (100 mg total) by mouth 2 (two) times daily.    Dispense:  14 capsule    Refill:  0      , MD [facility] [place]  

## 2016-01-29 NOTE — Assessment & Plan Note (Signed)
Patient with normal BMI. Has poor diet and exercise habits. Encouraged diet and exercise. Colonoscopy up-to-date. Mammography up-to-date. Vaccination status up-to-date. She declines HIV testing. We will check lab work as outlined below. Pap smear completed today at the patient's request. She would like to continue yearly Pap smears.

## 2016-01-29 NOTE — Assessment & Plan Note (Signed)
Check TSH 

## 2016-01-29 NOTE — Patient Instructions (Addendum)
Nice to see you. We are going to treat you for a UTI with Macrobid. We are going to obtain a CT scan of your abdomen and pelvis. We will some lab work at your convenience at the lab at the hospital. Once this returns we'll give you a call with the results. We'll call you with your Pap smear results. Please start working on diet and exercise as well. If you develop fevers, abdominal pain, or any new or changing symptoms please seek medical attention immediately.

## 2016-01-29 NOTE — Assessment & Plan Note (Signed)
Patient with intermittent palpitations recently. Otherwise asymptomatic. Possibly related to her subclinical hyperthyroidism and we will evaluate this. Other lab work as outlined below. EKG done today is reassuring. She'll continue to monitor. Given return precautions.

## 2016-01-29 NOTE — Assessment & Plan Note (Signed)
Patient with vague intermittent right lower quadrant discomfort over the last year. Mildly tender on right adnexal exam though no right lower quadrant tenderness today. She is status post hysterectomy and BSO. Given persistence we will obtain a CT scan abdomen and pelvis to evaluate further. Could be related to IBS given that she has issues with this. Once CT scan returns we will determine next step in management. Given return precautions.

## 2016-01-29 NOTE — Progress Notes (Signed)
Pre visit review using our clinic review tool, if applicable. No additional management support is needed unless otherwise documented below in the visit note. 

## 2016-01-29 NOTE — Assessment & Plan Note (Signed)
Patient with symptoms consistent with UTI. UA with blood. Given UA findings and symptoms we will proceed with treatment for UTI. Treat with Macrobid. Send for culture and microscopy. Given return precautions.

## 2016-01-30 LAB — URINALYSIS, MICROSCOPIC ONLY
Bacteria, UA: NONE SEEN [HPF]
Casts: NONE SEEN [LPF]
Crystals: NONE SEEN [HPF]
RBC / HPF: NONE SEEN RBC/HPF (ref <=2)
WBC UA: NONE SEEN WBC/HPF (ref <=5)
YEAST: NONE SEEN [HPF]

## 2016-01-31 LAB — URINE CULTURE: Organism ID, Bacteria: NO GROWTH

## 2016-02-01 ENCOUNTER — Encounter: Payer: Self-pay | Admitting: Family Medicine

## 2016-02-02 ENCOUNTER — Other Ambulatory Visit
Admission: RE | Admit: 2016-02-02 | Discharge: 2016-02-02 | Disposition: A | Discharge status: Home / Self Care | Payer: 59 | Source: Ambulatory Visit | Attending: Family Medicine | Admitting: Family Medicine

## 2016-02-02 DIAGNOSIS — R1031 Right lower quadrant pain: Secondary | ICD-10-CM | POA: Diagnosis not present

## 2016-02-02 DIAGNOSIS — E0590 Thyrotoxicosis, unspecified without thyrotoxic crisis or storm: Secondary | ICD-10-CM | POA: Insufficient documentation

## 2016-02-02 DIAGNOSIS — G8929 Other chronic pain: Secondary | ICD-10-CM | POA: Insufficient documentation

## 2016-02-02 DIAGNOSIS — Z13220 Encounter for screening for lipoid disorders: Secondary | ICD-10-CM | POA: Insufficient documentation

## 2016-02-02 LAB — CBC
HCT: 42.1 % (ref 35.0–47.0)
HEMOGLOBIN: 14.6 g/dL (ref 12.0–16.0)
MCH: 32.5 pg (ref 26.0–34.0)
MCHC: 34.6 g/dL (ref 32.0–36.0)
MCV: 94 fL (ref 80.0–100.0)
Platelets: 203 10*3/uL (ref 150–440)
RBC: 4.48 MIL/uL (ref 3.80–5.20)
RDW: 12.9 % (ref 11.5–14.5)
WBC: 5.7 10*3/uL (ref 3.6–11.0)

## 2016-02-02 LAB — COMPREHENSIVE METABOLIC PANEL
ALT: 13 U/L — ABNORMAL LOW (ref 14–54)
ANION GAP: 3 — AB (ref 5–15)
AST: 16 U/L (ref 15–41)
Albumin: 4.2 g/dL (ref 3.5–5.0)
Alkaline Phosphatase: 59 U/L (ref 38–126)
BILIRUBIN TOTAL: 0.3 mg/dL (ref 0.3–1.2)
BUN: 18 mg/dL (ref 6–20)
CHLORIDE: 104 mmol/L (ref 101–111)
CO2: 28 mmol/L (ref 22–32)
Calcium: 9 mg/dL (ref 8.9–10.3)
Creatinine, Ser: 0.62 mg/dL (ref 0.44–1.00)
Glucose, Bld: 97 mg/dL (ref 65–99)
POTASSIUM: 4.2 mmol/L (ref 3.5–5.1)
Sodium: 135 mmol/L (ref 135–145)
TOTAL PROTEIN: 7.2 g/dL (ref 6.5–8.1)

## 2016-02-02 LAB — TSH: TSH: 0.541 u[IU]/mL (ref 0.350–4.500)

## 2016-02-02 LAB — LIPID PANEL
CHOL/HDL RATIO: 3 ratio
Cholesterol: 190 mg/dL (ref 0–200)
HDL: 64 mg/dL (ref >40)
LDL Cholesterol: 112 mg/dL — ABNORMAL HIGH (ref 0–99)
Triglycerides: 68 mg/dL (ref <150)
VLDL: 14 mg/dL (ref 0–40)

## 2016-02-03 LAB — CYTOLOGY - PAP
Diagnosis: NEGATIVE
HPV: NOT DETECTED

## 2016-02-05 ENCOUNTER — Encounter: Payer: Self-pay | Admitting: Family Medicine

## 2016-02-05 ENCOUNTER — Other Ambulatory Visit: Payer: Self-pay

## 2016-02-05 MED ORDER — VALACYCLOVIR HCL 1 G PO TABS: ORAL_TABLET | ORAL | 1 refills | Status: DC

## 2016-02-05 MED ORDER — VIVELLE-DOT 0.05 MG/24HR TD PTTW: MEDICATED_PATCH | TRANSDERMAL | 6 refills | Status: DC

## 2016-02-05 NOTE — Telephone Encounter (Signed)
Sent to pharmacy 

## 2016-02-05 NOTE — Telephone Encounter (Signed)
Vivelle last filled 09/14/14 8 6rf Valtrex last filled 12/19/14 30 1  rf

## 2016-02-09 ENCOUNTER — Ambulatory Visit: Payer: 59

## 2016-02-12 ENCOUNTER — Ambulatory Visit
Admission: RE | Admit: 2016-02-12 | Discharge: 2016-02-12 | Disposition: A | Discharge status: Home / Self Care | Payer: 59 | Source: Ambulatory Visit | Attending: Family Medicine | Admitting: Family Medicine

## 2016-02-12 DIAGNOSIS — R1031 Right lower quadrant pain: Secondary | ICD-10-CM | POA: Diagnosis not present

## 2016-02-12 DIAGNOSIS — Z90710 Acquired absence of both cervix and uterus: Secondary | ICD-10-CM | POA: Insufficient documentation

## 2016-02-12 DIAGNOSIS — G8929 Other chronic pain: Secondary | ICD-10-CM | POA: Insufficient documentation

## 2016-02-12 DIAGNOSIS — I700 Atherosclerosis of aorta: Secondary | ICD-10-CM | POA: Diagnosis not present

## 2016-02-12 DIAGNOSIS — K760 Fatty (change of) liver, not elsewhere classified: Secondary | ICD-10-CM | POA: Diagnosis not present

## 2016-02-12 MED ORDER — IOPAMIDOL (ISOVUE-300) INJECTION 61%
100.0000 mL | Freq: Once | INTRAVENOUS | Status: AC | PRN
  Administered 2016-02-12: 100 mL via INTRAVENOUS

## 2016-02-18 ENCOUNTER — Telehealth: Payer: Self-pay | Admitting: Family Medicine

## 2016-02-18 NOTE — Telephone Encounter (Signed)
Pt called returning your call. Thank you!  Phone # [phone]

## 2016-02-18 NOTE — Telephone Encounter (Signed)
See result note.  

## 2016-02-24 ENCOUNTER — Other Ambulatory Visit: Payer: Self-pay | Admitting: Family Medicine

## 2016-02-24 DIAGNOSIS — K760 Fatty (change of) liver, not elsewhere classified: Secondary | ICD-10-CM

## 2016-02-26 ENCOUNTER — Other Ambulatory Visit (INDEPENDENT_AMBULATORY_CARE_PROVIDER_SITE_OTHER): Payer: 59

## 2016-02-26 DIAGNOSIS — K760 Fatty (change of) liver, not elsewhere classified: Secondary | ICD-10-CM | POA: Diagnosis not present

## 2016-02-26 LAB — IRON AND TIBC
%SAT: 20 % (ref 11–50)
Iron: 59 ug/dL (ref 45–160)
TIBC: 293 ug/dL (ref 250–450)
UIBC: 234 ug/dL (ref 125–400)

## 2016-02-26 LAB — FERRITIN: Ferritin: 32.9 ng/mL (ref 10.0–291.0)

## 2016-02-27 LAB — HEPATITIS B SURFACE ANTIBODY,QUALITATIVE: HEP B S AB: NEGATIVE

## 2016-02-27 LAB — HEPATITIS B SURFACE ANTIGEN: HEP B S AG: NEGATIVE

## 2016-02-27 LAB — HEPATITIS A ANTIBODY, TOTAL: HEP A TOTAL AB: NONREACTIVE

## 2016-02-27 LAB — HEPATITIS C ANTIBODY: HCV AB: NEGATIVE

## 2016-02-27 LAB — HEPATITIS B CORE ANTIBODY, TOTAL: Hep B Core Total Ab: NONREACTIVE

## 2016-03-25 DIAGNOSIS — H5213 Myopia, bilateral: Secondary | ICD-10-CM | POA: Diagnosis not present

## 2016-04-12 DIAGNOSIS — B372 Candidiasis of skin and nail: Secondary | ICD-10-CM | POA: Diagnosis not present

## 2016-04-14 ENCOUNTER — Encounter: Payer: Self-pay | Admitting: Family Medicine

## 2016-04-29 ENCOUNTER — Encounter: Payer: Self-pay | Admitting: Family Medicine

## 2016-04-29 ENCOUNTER — Ambulatory Visit (INDEPENDENT_AMBULATORY_CARE_PROVIDER_SITE_OTHER): Payer: 59 | Admitting: Family Medicine

## 2016-04-29 VITALS — BP 112/60 | HR 84 | Temp 98.0°F | Wt 146.4 lb

## 2016-04-29 DIAGNOSIS — E0590 Thyrotoxicosis, unspecified without thyrotoxic crisis or storm: Secondary | ICD-10-CM | POA: Diagnosis not present

## 2016-04-29 DIAGNOSIS — K219 Gastro-esophageal reflux disease without esophagitis: Secondary | ICD-10-CM

## 2016-04-29 DIAGNOSIS — Z23 Encounter for immunization: Secondary | ICD-10-CM

## 2016-04-29 DIAGNOSIS — K760 Fatty (change of) liver, not elsewhere classified: Secondary | ICD-10-CM

## 2016-04-29 NOTE — Progress Notes (Signed)
Pre visit review using our clinic review tool, if applicable. No additional management support is needed unless otherwise documented below in the visit note. 

## 2016-04-29 NOTE — Assessment & Plan Note (Signed)
Recent TSH normal. Plan on rechecking at next visit.

## 2016-04-29 NOTE — Patient Instructions (Addendum)
Nice to see you. We'll give the diet information. We'll check lab work and contact her with the results.  Diet Recommendations  Starchy (carb) foods: Bread, rice, pasta, potatoes, corn, cereal, grits, crackers, bagels, muffins, all baked goods.  (Fruits, milk, and yogurt also have carbohydrate, but most of these foods will not spike your blood sugar as the starchy foods will.)  A few fruits do cause high blood sugars; use small portions of bananas (limit to 1/2 at a time), grapes, watermelon, oranges, and most tropical fruits.    Protein foods: Meat, fish, poultry, eggs, dairy foods, and beans such as pinto and kidney beans (beans also provide carbohydrate).   1. Eat at least 3 meals and 1-2 snacks per day. Never go more than 4-5 hours while awake without eating. Eat breakfast within the first hour of getting up.   2. Limit starchy foods to TWO per meal and ONE per snack. ONE portion of a starchy  food is equal to the following:   - ONE slice of bread (or its equivalent, such as half of a hamburger bun).   - 1/2 cup of a "scoopable" starchy food such as potatoes or rice.   - 15 grams of carbohydrate as shown on food label.  3. Include at every meal: a protein food, a carb food, and vegetables and/or fruit.   - Obtain twice the volume of veg's as protein or carbohydrate foods for both lunch and dinner.   - Fresh or frozen veg's are best.   - Keep frozen veg's on hand for a quick vegetable serving.

## 2016-04-29 NOTE — Progress Notes (Signed)
  Tommi Rumps, MD Phone: [phone]  Ashley Contreras is a 56 y.o. female who presents today for f/u.  She reports history of subclinical hyperthyroidism and notes no heat intolerance. Does note some cold intolerance. Notes her hands and feet do get cold at times. Wonders if she had Raynaud's.  GERD: Has some symptoms if she is stressed or if she eats the wrong things. Acidic foods bring this about. Also chocolate. She takes Protonix. No abdominal pain or blood in her stool.  Fatty liver: Recently seen on CT scan. Lab work for hepatitis panel and iron unremarkable. She's tried work on her diet and exercises to she's not sure if she knows how to eat properly. She tries to walk on the weekends. Rare alcohol use. Tylenol use.  PMH: nonsmoker.   ROS see history of present illness  Objective  Physical Exam Vitals:   04/29/16 1433  BP: 112/60  Pulse: 84  Temp: 98 F (36.7 C)    BP Readings from Last 3 Encounters:  04/29/16 112/60  01/29/16 114/68  08/07/15 100/64   Wt Readings from Last 3 Encounters:  04/29/16 146 lb 6.4 oz (66.4 kg)  01/29/16 141 lb 3.2 oz (64 kg)  04/24/15 142 lb 2 oz (64.5 kg)    Physical Exam  Constitutional: She is well-developed, well-nourished, and in no distress.  Cardiovascular: Normal rate, regular rhythm and normal heart sounds.   Pulmonary/Chest: Effort normal and breath sounds normal.  Abdominal: Soft. Bowel sounds are normal. She exhibits no distension. There is no tenderness. There is no rebound and no guarding.  Musculoskeletal: She exhibits no edema.  Neurological: She is alert. Gait normal.  Skin: Skin is warm and dry.     Assessment/Plan: Please see individual problem list.  GERD (gastroesophageal reflux disease) Relatively well controlled on Protonix. Advised to monitor her diet and not eat things that exacerbate this. She'll continue to monitor.  Subclinical hyperthyroidism Recent TSH normal. Plan on rechecking at next  visit.  Fatty liver Noted on CT scan previously. Lab work has been unremarkable. Discussed diet to limit cholesterol and fatty food intake. Given dietary guidelines. Discussed limiting alcohol use. Discussed Tylenol dosing less than or equal to 2000 mg a day. Encouraged exercise. Continue to monitor. Hepatitis A and B vaccinations given today and she will return for further vaccinations in 1 month and 6 months.   Tommi Rumps, MD [place]

## 2016-04-29 NOTE — Assessment & Plan Note (Signed)
Relatively well controlled on Protonix. Advised to monitor her diet and not eat things that exacerbate this. She'll continue to monitor.

## 2016-04-29 NOTE — Addendum Note (Signed)
Addended by: Leeanne Rio on: 04/29/2016 03:32 PM   Modules accepted: Orders

## 2016-04-29 NOTE — Assessment & Plan Note (Signed)
Noted on CT scan previously. Lab work has been unremarkable. Discussed diet to limit cholesterol and fatty food intake. Given dietary guidelines. Discussed limiting alcohol use. Discussed Tylenol dosing less than or equal to 2000 mg a day. Encouraged exercise. Continue to monitor. Hepatitis A and B vaccinations given today and she will return for further vaccinations in 1 month and 6 months.

## 2016-05-31 DIAGNOSIS — J340 Abscess, furuncle and carbuncle of nose: Secondary | ICD-10-CM | POA: Diagnosis not present

## 2016-05-31 DIAGNOSIS — H903 Sensorineural hearing loss, bilateral: Secondary | ICD-10-CM | POA: Diagnosis not present

## 2016-05-31 DIAGNOSIS — H6123 Impacted cerumen, bilateral: Secondary | ICD-10-CM | POA: Diagnosis not present

## 2016-06-02 ENCOUNTER — Ambulatory Visit (INDEPENDENT_AMBULATORY_CARE_PROVIDER_SITE_OTHER): Payer: 59 | Admitting: *Deleted

## 2016-06-02 DIAGNOSIS — Z23 Encounter for immunization: Secondary | ICD-10-CM

## 2016-06-02 DIAGNOSIS — K760 Fatty (change of) liver, not elsewhere classified: Secondary | ICD-10-CM | POA: Diagnosis not present

## 2016-06-03 DIAGNOSIS — D2261 Melanocytic nevi of right upper limb, including shoulder: Secondary | ICD-10-CM | POA: Diagnosis not present

## 2016-06-03 DIAGNOSIS — D225 Melanocytic nevi of trunk: Secondary | ICD-10-CM | POA: Diagnosis not present

## 2016-06-03 DIAGNOSIS — D2271 Melanocytic nevi of right lower limb, including hip: Secondary | ICD-10-CM | POA: Diagnosis not present

## 2016-06-03 DIAGNOSIS — D2272 Melanocytic nevi of left lower limb, including hip: Secondary | ICD-10-CM | POA: Diagnosis not present

## 2016-06-28 DIAGNOSIS — H903 Sensorineural hearing loss, bilateral: Secondary | ICD-10-CM | POA: Diagnosis not present

## 2016-06-28 DIAGNOSIS — J309 Allergic rhinitis, unspecified: Secondary | ICD-10-CM | POA: Diagnosis not present

## 2016-07-07 ENCOUNTER — Encounter: Payer: Self-pay | Admitting: Physician Assistant

## 2016-07-07 ENCOUNTER — Ambulatory Visit: Payer: Self-pay | Admitting: Physician Assistant

## 2016-07-07 VITALS — BP 100/72 | HR 80 | Temp 97.9°F

## 2016-07-07 DIAGNOSIS — W57XXXA Bitten or stung by nonvenomous insect and other nonvenomous arthropods, initial encounter: Secondary | ICD-10-CM

## 2016-07-07 DIAGNOSIS — R21 Rash and other nonspecific skin eruption: Secondary | ICD-10-CM

## 2016-07-07 MED ORDER — PREDNISONE 10 MG PO TABS: 30.0000 mg | ORAL_TABLET | Freq: Every day | ORAL | 0 refills | Status: DC

## 2016-07-07 MED ORDER — DOXYCYCLINE HYCLATE 100 MG PO TABS: 100.0000 mg | ORAL_TABLET | Freq: Two times a day (BID) | ORAL | 0 refills | Status: DC

## 2016-07-07 NOTE — Progress Notes (Signed)
S: c/o tick bite to r hip few days ago, another ?insect bite to r side as the area is pink and swollen, no drainage, did look like a blister yesterday but that is gone, no fever/chills, no cp/sob, area is itchy not painful  O: vitals wnl, nad, skin with round pink raised area, no bite mark or pustule noted, no drainage, no increased warmth, n/v intact  A: tick bite, rash  P: doxy 100mg  bid, pred 30mg  qd x 3d

## 2016-07-27 ENCOUNTER — Telehealth: Payer: Self-pay | Admitting: Radiology

## 2016-07-27 DIAGNOSIS — E0590 Thyrotoxicosis, unspecified without thyrotoxic crisis or storm: Secondary | ICD-10-CM

## 2016-07-27 NOTE — Telephone Encounter (Signed)
Pt coming in Friday for labs, please place future orders. Thank you.

## 2016-07-27 NOTE — Telephone Encounter (Signed)
I am not sure what labs the patient currently needs. Can you check in 2 this a little further? Thanks.

## 2016-07-28 NOTE — Telephone Encounter (Signed)
You wanted a tsh checked

## 2016-07-28 NOTE — Addendum Note (Signed)
Addended byCaryl Bis, ERIC on: 07/28/2016 11:38 AM   Modules accepted: Orders

## 2016-07-29 ENCOUNTER — Other Ambulatory Visit (INDEPENDENT_AMBULATORY_CARE_PROVIDER_SITE_OTHER): Payer: 59

## 2016-07-29 DIAGNOSIS — E0590 Thyrotoxicosis, unspecified without thyrotoxic crisis or storm: Secondary | ICD-10-CM | POA: Diagnosis not present

## 2016-07-29 LAB — TSH: TSH: 0.56 u[IU]/mL (ref 0.35–4.50)

## 2016-10-03 DIAGNOSIS — H903 Sensorineural hearing loss, bilateral: Secondary | ICD-10-CM | POA: Diagnosis not present

## 2016-10-03 DIAGNOSIS — H6122 Impacted cerumen, left ear: Secondary | ICD-10-CM | POA: Diagnosis not present

## 2016-10-03 DIAGNOSIS — H748X9 Other specified disorders of middle ear and mastoid, unspecified ear: Secondary | ICD-10-CM | POA: Diagnosis not present

## 2016-10-10 ENCOUNTER — Other Ambulatory Visit: Payer: Self-pay | Admitting: Family Medicine

## 2016-10-10 NOTE — Telephone Encounter (Signed)
Last OV 04/29/16 last filled 02/05/16 8 6rf

## 2016-11-04 ENCOUNTER — Ambulatory Visit: Payer: Self-pay

## 2016-11-09 ENCOUNTER — Ambulatory Visit (INDEPENDENT_AMBULATORY_CARE_PROVIDER_SITE_OTHER): Payer: 59 | Admitting: *Deleted

## 2016-11-09 DIAGNOSIS — Z23 Encounter for immunization: Secondary | ICD-10-CM

## 2016-11-09 DIAGNOSIS — K760 Fatty (change of) liver, not elsewhere classified: Secondary | ICD-10-CM

## 2016-11-09 NOTE — Progress Notes (Signed)
Patient presented for last in Hep a/b series and tolerated well.

## 2016-12-08 DIAGNOSIS — H748X1 Other specified disorders of right middle ear and mastoid: Secondary | ICD-10-CM | POA: Diagnosis not present

## 2016-12-14 ENCOUNTER — Encounter: Payer: Self-pay | Admitting: Family Medicine

## 2016-12-15 ENCOUNTER — Encounter: Payer: Self-pay | Admitting: Family Medicine

## 2016-12-15 ENCOUNTER — Other Ambulatory Visit: Payer: Self-pay | Admitting: Family Medicine

## 2016-12-15 DIAGNOSIS — Z1239 Encounter for other screening for malignant neoplasm of breast: Secondary | ICD-10-CM

## 2016-12-17 ENCOUNTER — Other Ambulatory Visit: Payer: Self-pay | Admitting: Family Medicine

## 2016-12-17 DIAGNOSIS — K760 Fatty (change of) liver, not elsewhere classified: Secondary | ICD-10-CM

## 2016-12-21 ENCOUNTER — Encounter: Payer: Self-pay | Admitting: Gastroenterology

## 2016-12-31 DIAGNOSIS — R350 Frequency of micturition: Secondary | ICD-10-CM | POA: Diagnosis not present

## 2016-12-31 DIAGNOSIS — J00 Acute nasopharyngitis [common cold]: Secondary | ICD-10-CM | POA: Diagnosis not present

## 2017-01-18 ENCOUNTER — Ambulatory Visit
Admission: RE | Admit: 2017-01-18 | Discharge: 2017-01-18 | Disposition: A | Discharge status: Home / Self Care | Payer: 59 | Source: Ambulatory Visit | Attending: Family Medicine | Admitting: Family Medicine

## 2017-01-18 DIAGNOSIS — Z1239 Encounter for other screening for malignant neoplasm of breast: Secondary | ICD-10-CM

## 2017-01-18 DIAGNOSIS — Z1231 Encounter for screening mammogram for malignant neoplasm of breast: Secondary | ICD-10-CM | POA: Insufficient documentation

## 2017-01-26 ENCOUNTER — Ambulatory Visit (INDEPENDENT_AMBULATORY_CARE_PROVIDER_SITE_OTHER): Payer: 59 | Admitting: Gastroenterology

## 2017-01-26 ENCOUNTER — Encounter: Payer: Self-pay | Admitting: Gastroenterology

## 2017-01-26 ENCOUNTER — Other Ambulatory Visit: Payer: Self-pay

## 2017-01-26 VITALS — BP 115/67 | HR 89 | Ht 65.5 in | Wt 144.0 lb

## 2017-01-26 DIAGNOSIS — K760 Fatty (change of) liver, not elsewhere classified: Secondary | ICD-10-CM

## 2017-01-26 DIAGNOSIS — K219 Gastro-esophageal reflux disease without esophagitis: Secondary | ICD-10-CM

## 2017-01-26 NOTE — Progress Notes (Signed)
Gastroenterology Consultation  Referring Provider:     Leone Haven, [place]reras Primary Care Physician:  [place] Haven, [place] Primary Gastroenterologist:  Dr. Allen Norris     Reason for Consultation:     Fatty liver        HPI:   [place] Cont[place] is a 56 y.o. y/o female referred for consultation & management of Fatty liver by Dr. Caryl Bis, Angela Adam, [place].  This patient comes in today with a history of irritable bowel syndrome and was found to have fatty liver.  The patient's liver enzymes have been normal in the past.  There is no report of any abdominal pain.  The patient also denies any alcohol use or abuse. She also reports that she has heartburn intermittently and she takes Protonix for this.  She states that sometimes she will take the Protonix for a couple of weeks and then stop it.  There is no report of any exacerbation of her diarrhea predominant irritable bowel syndrome at the present time.  The patient also reports that she has a history of reflux and has been told in the past that she has chronic gastritis and esophagitis. The patient has also had her vaccination for hepatitis A and hepatitis B after being found to have fatty liver. The patient does not have diabetes but has had slightly elevated LDL cholesterol at 112. Her liver enzymes have shown her to have an AST of 16 with an ALT of 13 and a total bilirubin of 0.3.  Past Medical History:  Diagnosis Date  . Abnormal Pap smear 1989   s/p colposcopy  . Allergy    hay fever  . Arthritis   . Asthma   . Chicken pox   . Colon polyp   . GERD (gastroesophageal reflux disease)   . Mitral valve prolapse   . Shingles    waist, right side  . UTI (lower urinary tract infection)     Past Surgical History:  Procedure Laterality Date  . ABDOMINAL HYSTERECTOMY  2008  . BREAST BIOPSY     left, benign  . calcaneous osteotomy  06/2011  . TONSILLECTOMY AND ADENOIDECTOMY  2008    Prior to Admission medications   Medication Sig Start Date  End Date Taking? Authorizing Provider  acetaminophen (TYLENOL) 500 MG tablet Take 500 mg by mouth every 4 (four) hours as needed.   Yes Provider, Historical, [place]  CALCIUM GLUCONATE PO Take 1 tablet by mouth daily.   Yes Provider, Historical, [place]  cholecalciferol (VITAMIN D) 1000 units tablet Take 1,000 Units by mouth daily.   Yes Provider, Historical, [place]  doxycycline (VIBRA-TABS) 100 MG tablet Take 1 tablet (100 mg total) by mouth 2 (two) times daily. 07/07/16  Yes Fisher, Linden Dolin, PA  estradiol (VIVELLE-DOT) 0.05 MG/24HR patch PLACE 1 PATCH ONTO THE SKIN 2 TIMES A WEEK 10/10/16  Yes [place] Haven, [place]  fluticasone [facility]) 50 MCG/ACT nasal spray Place into both nostrils daily.   Yes Provider, Historical, [place]  loratadine-pseudoephedrine (CLARITIN-D 12-HOUR) 5-120 MG per tablet Take 1 tablet by mouth daily. 11/02/12  Yes Jackolyn Confer, [place]  pantoprazole (PROTONIX) 40 MG tablet Take 1 tablet (40 mg total) by mouth 2 (two) times daily. 12/19/14  Yes Jackolyn Confer, [place]  predniSONE (DELTASONE) 10 MG tablet Take 3 tablets (30 mg total) by mouth daily with breakfast. 07/07/16  Yes Fisher, Linden Dolin, PA  valACYclovir (VALTREX) 1000 MG tablet Take 2 tablets at the onset of a fever  blister and then take 2 tablets 12 hours later. As needed 02/05/16  Yes Ashley Haven, [place]    Family History  Problem Relation Age of Onset  . Arthritis Mother   . Cancer Mother 40       breast  . Hypertension Mother   . Mental illness Mother   . Diabetes Mother   . Breast cancer Mother 25  . Hyperlipidemia Father   . Heart disease Father   . Cancer Father        lung  . Mental illness Sister   . Myasthenia gravis Sister   . Multiple sclerosis Sister   . Multiple sclerosis Other   . Stroke Maternal Aunt   . Cancer Maternal Aunt        breast  . Breast cancer Maternal Aunt   . Stroke Maternal Grandmother   . Hypertension Maternal Grandmother   . Diabetes Maternal Grandmother      Social History    Tobacco Use  . Smoking status: Never Smoker  . Smokeless tobacco: Never Used  Substance Use Topics  . Alcohol use: No  . Drug use: No    Allergies as of 01/26/2017 - Review Complete 01/26/2017  Allergen Reaction Noted  . Penicillins Swelling 10/21/2011  . Cinnamon  02/27/2015    Review of Systems:    All systems reviewed and negative except where noted in HPI.   Physical Exam:  BP 115/67 (BP Location: Left Arm, Patient Position: Sitting, Cuff Size: Normal)   Pulse 89   Ht 5' 5.5" (1.664 m)   Wt 144 lb (65.3 kg)   BMI 23.60 kg/m  No LMP recorded. Patient has had a hysterectomy. Psych:  Alert and cooperative. Normal mood and affect. General:   Alert,  Well-developed, well-nourished, pleasant and cooperative in NAD Head:  Normocephalic and atraumatic. Eyes:  Sclera clear, no icterus.   Conjunctiva pink. Ears:  Normal auditory acuity. Nose:  No deformity, discharge, or lesions. Mouth:  No deformity or lesions,oropharynx pink & moist. Neck:  Supple; no masses or thyromegaly. Lungs:  Respirations even and unlabored.  Clear throughout to auscultation.   No wheezes, crackles, or rhonchi. No acute distress. Heart:  Regular rate and rhythm; no murmurs, clicks, rubs, or gallops. Abdomen:  Normal bowel sounds.  No bruits.  Soft, non-tender and non-distended without masses, hepatosplenomegaly or hernias noted.  No guarding or rebound tenderness.  Negative Carnett sign.   Rectal:  Deferred.  Msk:  Symmetrical without gross deformities.  Good, equal movement & strength bilaterally. Pulses:  Normal pulses noted. Extremities:  No clubbing or edema.  No cyanosis. Neurologic:  Alert and oriented x3;  grossly normal neurologically. Skin:  Intact without significant lesions or rashes.  No jaundice. Lymph Nodes:  No significant cervical adenopathy. Psych:  Alert and cooperative. Normal mood and affect.  Imaging Studies: Mm Screening Breast Tomo Bilateral  Result Date:  01/18/2017 CLINICAL DATA:  Screening. EXAM: 2D DIGITAL SCREENING BILATERAL MAMMOGRAM WITH CAD AND ADJUNCT TOMO COMPARISON:  Previous exam(s). ACR Breast Density Category c: The breast tissue is heterogeneously dense, which may obscure small masses. FINDINGS: There are no findings suspicious for malignancy. Images were processed with CAD. IMPRESSION: No mammographic evidence of malignancy. A result letter of this screening mammogram will be mailed directly to the patient. RECOMMENDATION: Screening mammogram in one year. (Code:SM-B-01Y) BI-RADS CATEGORY  1: Negative. Electronically Signed   By: Lajean Manes M.D.   On: 01/18/2017 10:04    Assessment and Plan:  NYELLE WOLFSON is a 56 y.o. y/o female who comes in here today with a history of GERD and was found to have fatty liver.  The patient has no complaints at the present time.  We have discussed the long-term implications of a PPI.  She has also been told that since her liver enzymes are normal the goal of treatment would be to normalize the LFTs and decrease the fat in the liver.  The patient will be set up for an EGD because of her chronic reflux.  She will also be set up for a screening colonoscopy. I have discussed risks & benefits which include, but are not limited to, bleeding, infection, perforation & drug reaction.  The patient agrees with this plan & written consent will be obtained.     Lucilla Lame, [place]ras. Marval Regal   Note: This dictation was prepared with Dragon dictation along with smaller phrase technology. Any transcriptional errors that result from this process are unintentional.

## 2017-01-27 ENCOUNTER — Other Ambulatory Visit: Payer: Self-pay

## 2017-01-27 ENCOUNTER — Telehealth: Payer: Self-pay

## 2017-01-27 DIAGNOSIS — Z1211 Encounter for screening for malignant neoplasm of colon: Secondary | ICD-10-CM

## 2017-01-27 LAB — HEPATIC FUNCTION PANEL
ALT: 15 IU/L (ref 0–32)
AST: 15 IU/L (ref 0–40)
Albumin: 4.5 g/dL (ref 3.5–5.5)
Alkaline Phosphatase: 68 IU/L (ref 39–117)
Bilirubin Total: 0.2 mg/dL (ref 0.0–1.2)
Bilirubin, Direct: 0.05 mg/dL (ref 0.00–0.40)
Total Protein: 6.8 g/dL (ref 6.0–8.5)

## 2017-01-27 NOTE — Telephone Encounter (Signed)
Left vm letting pt know labs were normal.

## 2017-01-27 NOTE — Telephone Encounter (Signed)
-----   Message from Lucilla Lame, MD sent at 01/27/2017  3:05 PM EST ----- Let the patient know her liver enzymes are still absolutely normal.

## 2017-01-30 ENCOUNTER — Other Ambulatory Visit: Payer: Self-pay

## 2017-01-30 DIAGNOSIS — Z1211 Encounter for screening for malignant neoplasm of colon: Secondary | ICD-10-CM

## 2017-01-31 ENCOUNTER — Telehealth: Payer: Self-pay | Admitting: Gastroenterology

## 2017-01-31 NOTE — Telephone Encounter (Signed)
Rylah called and needs to r/s her procedure to 02/17/17.

## 2017-02-01 NOTE — Telephone Encounter (Signed)
Pt has been reschedule to 02/17/17. Left vm letting pt know she has been moved.

## 2017-02-08 ENCOUNTER — Other Ambulatory Visit: Payer: Self-pay

## 2017-02-08 ENCOUNTER — Encounter: Payer: Self-pay | Admitting: *Deleted

## 2017-02-16 NOTE — Discharge Instructions (Signed)
General Anesthesia, Adult, Care After °These instructions provide you with information about caring for yourself after your procedure. Your health care provider may also give you more specific instructions. Your treatment has been planned according to current medical practices, but problems sometimes occur. Call your health care provider if you have any problems or questions after your procedure. °What can I expect after the procedure? °After the procedure, it is common to have: °· Vomiting. °· A sore throat. °· Mental slowness. ° °It is common to feel: °· Nauseous. °· Cold or shivery. °· Sleepy. °· Tired. °· Sore or achy, even in parts of your body where you did not have surgery. ° °Follow these instructions at home: °For at least 24 hours after the procedure: °· Do not: °? Participate in activities where you could fall or become injured. °? Drive. °? Use heavy machinery. °? Drink alcohol. °? Take sleeping pills or medicines that cause drowsiness. °? Make important decisions or sign legal documents. °? Take care of children on your own. °· Rest. °Eating and drinking °· If you vomit, drink water, juice, or soup when you can drink without vomiting. °· Drink enough fluid to keep your urine clear or pale yellow. °· Make sure you have little or no nausea before eating solid foods. °· Follow the diet recommended by your health care provider. °General instructions °· Have a responsible adult stay with you until you are awake and alert. °· Return to your normal activities as told by your health care provider. Ask your health care provider what activities are safe for you. °· Take over-the-counter and prescription medicines only as told by your health care provider. °· If you smoke, do not smoke without supervision. °· Keep all follow-up visits as told by your health care provider. This is important. °Contact a health care provider if: °· You continue to have nausea or vomiting at home, and medicines are not helpful. °· You  cannot drink fluids or start eating again. °· You cannot urinate after 8-12 hours. °· You develop a skin rash. °· You have fever. °· You have increasing redness at the site of your procedure. °Get help right away if: °· You have difficulty breathing. °· You have chest pain. °· You have unexpected bleeding. °· You feel that you are having a life-threatening or urgent problem. °This information is not intended to replace advice given to you by your health care provider. Make sure you discuss any questions you have with your health care provider. °Document Released: 05/16/2000 Document Revised: 07/13/2015 Document Reviewed: 01/22/2015 °Elsevier Interactive Patient Education © 2018 Elsevier Inc. ° °

## 2017-02-17 ENCOUNTER — Ambulatory Visit: Payer: 59 | Admitting: Anesthesiology

## 2017-02-17 ENCOUNTER — Ambulatory Visit
Admission: RE | Admit: 2017-02-17 | Discharge: 2017-02-17 | Disposition: A | Discharge status: Home / Self Care | Payer: 59 | Source: Ambulatory Visit | Attending: Gastroenterology | Admitting: Gastroenterology

## 2017-02-17 ENCOUNTER — Encounter
Admission: RE | Disposition: A | Discharge status: Home / Self Care | Payer: Self-pay | Source: Ambulatory Visit | Attending: Gastroenterology

## 2017-02-17 DIAGNOSIS — M1990 Unspecified osteoarthritis, unspecified site: Secondary | ICD-10-CM | POA: Insufficient documentation

## 2017-02-17 DIAGNOSIS — J45909 Unspecified asthma, uncomplicated: Secondary | ICD-10-CM | POA: Diagnosis not present

## 2017-02-17 DIAGNOSIS — K219 Gastro-esophageal reflux disease without esophagitis: Secondary | ICD-10-CM | POA: Diagnosis not present

## 2017-02-17 DIAGNOSIS — R12 Heartburn: Secondary | ICD-10-CM | POA: Diagnosis not present

## 2017-02-17 DIAGNOSIS — I341 Nonrheumatic mitral (valve) prolapse: Secondary | ICD-10-CM | POA: Insufficient documentation

## 2017-02-17 DIAGNOSIS — Z79899 Other long term (current) drug therapy: Secondary | ICD-10-CM | POA: Insufficient documentation

## 2017-02-17 DIAGNOSIS — Z1211 Encounter for screening for malignant neoplasm of colon: Secondary | ICD-10-CM | POA: Insufficient documentation

## 2017-02-17 HISTORY — DX: Presence of spectacles and contact lenses: Z97.3

## 2017-02-17 HISTORY — DX: Nausea with vomiting, unspecified: Z98.890

## 2017-02-17 HISTORY — DX: Other specified postprocedural states: R11.2

## 2017-02-17 HISTORY — DX: Other complications of anesthesia, initial encounter: T88.59XA

## 2017-02-17 HISTORY — DX: Family history of other specified conditions: Z84.89

## 2017-02-17 HISTORY — PX: COLONOSCOPY WITH PROPOFOL: SHX5780

## 2017-02-17 HISTORY — PX: ESOPHAGOGASTRODUODENOSCOPY (EGD) WITH PROPOFOL: SHX5813

## 2017-02-17 HISTORY — DX: Adverse effect of unspecified anesthetic, initial encounter: T41.45XA

## 2017-02-17 SURGERY — COLONOSCOPY WITH PROPOFOL
Anesthesia: General | Wound class: Clean Contaminated

## 2017-02-17 MED ORDER — ACETAMINOPHEN 160 MG/5ML PO SOLN: 325.0000 mg | Freq: Once | ORAL | Status: DC

## 2017-02-17 MED ORDER — PROPOFOL 10 MG/ML IV BOLUS
INTRAVENOUS | Status: DC | PRN
  Administered 2017-02-17 (×3): 20 mg via INTRAVENOUS
  Administered 2017-02-17: 30 mg via INTRAVENOUS
  Administered 2017-02-17: 150 mg via INTRAVENOUS
  Administered 2017-02-17: 30 mg via INTRAVENOUS
  Administered 2017-02-17: 20 mg via INTRAVENOUS

## 2017-02-17 MED ORDER — GLYCOPYRROLATE 0.2 MG/ML IJ SOLN
INTRAMUSCULAR | Status: DC | PRN
  Administered 2017-02-17: 0.1 mg via INTRAVENOUS

## 2017-02-17 MED ORDER — STERILE WATER FOR IRRIGATION IR SOLN
Status: DC | PRN
  Administered 2017-02-17: 10:00:00

## 2017-02-17 MED ORDER — LACTATED RINGERS IV SOLN
INTRAVENOUS | Status: DC
  Administered 2017-02-17: 08:00:00 via INTRAVENOUS

## 2017-02-17 MED ORDER — ACETAMINOPHEN 325 MG PO TABS: 325.0000 mg | ORAL_TABLET | Freq: Once | ORAL | Status: DC

## 2017-02-17 MED ORDER — LIDOCAINE HCL (CARDIAC) 20 MG/ML IV SOLN
INTRAVENOUS | Status: DC | PRN
  Administered 2017-02-17: 50 mg via INTRAVENOUS

## 2017-02-17 SURGICAL SUPPLY — 35 items
BALLN DILATOR 10-12 8 (BALLOONS)
BALLN DILATOR 12-15 8 (BALLOONS)
BALLN DILATOR 15-18 8 (BALLOONS)
BALLN DILATOR CRE 0-12 8 (BALLOONS)
BALLN DILATOR ESOPH 8 10 CRE (MISCELLANEOUS) IMPLANT
BALLOON DILATOR 12-15 8 (BALLOONS) IMPLANT
BALLOON DILATOR 15-18 8 (BALLOONS) IMPLANT
BALLOON DILATOR CRE 0-12 8 (BALLOONS) IMPLANT
BLOCK BITE 60FR ADLT L/F GRN (MISCELLANEOUS) ×2 IMPLANT
CANISTER SUCT 1200ML W/VALVE (MISCELLANEOUS) ×2 IMPLANT
CLIP HMST 235XBRD CATH ROT (MISCELLANEOUS) IMPLANT
CLIP RESOLUTION 360 11X235 (MISCELLANEOUS)
FCP ESCP3.2XJMB 240X2.8X (MISCELLANEOUS)
FORCEPS BIOP RAD 4 LRG CAP 4 (CUTTING FORCEPS) IMPLANT
FORCEPS BIOP RJ4 240 W/NDL (MISCELLANEOUS)
FORCEPS ESCP3.2XJMB 240X2.8X (MISCELLANEOUS) IMPLANT
GOWN CVR UNV OPN BCK APRN NK (MISCELLANEOUS) ×2 IMPLANT
GOWN ISOL THUMB LOOP REG UNIV (MISCELLANEOUS) ×2
INJECTOR VARIJECT VIN23 (MISCELLANEOUS) IMPLANT
KIT DEFENDO VALVE AND CONN (KITS) IMPLANT
KIT ENDO PROCEDURE OLY (KITS) ×2 IMPLANT
MARKER SPOT ENDO TATTOO 5ML (MISCELLANEOUS) IMPLANT
PAD GROUND ADULT SPLIT (MISCELLANEOUS) IMPLANT
PROBE APC STR FIRE (PROBE) IMPLANT
RETRIEVER NET PLAT FOOD (MISCELLANEOUS) IMPLANT
RETRIEVER NET ROTH 2.5X230 LF (MISCELLANEOUS) IMPLANT
SNARE SHORT THROW 13M SML OVAL (MISCELLANEOUS) IMPLANT
SNARE SHORT THROW 30M LRG OVAL (MISCELLANEOUS) IMPLANT
SNARE SNG USE RND 15MM (INSTRUMENTS) IMPLANT
SPOT EX ENDOSCOPIC TATTOO (MISCELLANEOUS)
SYR INFLATION 60ML (SYRINGE) IMPLANT
TRAP ETRAP POLY (MISCELLANEOUS) IMPLANT
VARIJECT INJECTOR VIN23 (MISCELLANEOUS)
WATER STERILE IRR 250ML POUR (IV SOLUTION) ×2 IMPLANT
WIRE CRE 18-20MM 8CM F G (MISCELLANEOUS) IMPLANT

## 2017-02-17 NOTE — Transfer of Care (Signed)
Immediate Anesthesia Transfer of Care Note  Patient: Ashley Contreras  Procedure(s) Performed: COLONOSCOPY WITH PROPOFOL (N/A ) ESOPHAGOGASTRODUODENOSCOPY (EGD) WITH PROPOFOL (N/A )  Patient Location: PACU  Anesthesia Type: General  Level of Consciousness: awake, alert  and patient cooperative  Airway and Oxygen Therapy: Patient Spontanous Breathing and Patient connected to supplemental oxygen  Post-op Assessment: Post-op Vital signs reviewed, Patient's Cardiovascular Status Stable, Respiratory Function Stable, Patent Airway and No signs of Nausea or vomiting  Post-op Vital Signs: Reviewed and stable  Complications: No apparent anesthesia complications

## 2017-02-17 NOTE — H&P (Signed)
Ashley Lame, MD [place] Contreras., [address] [place], [place] [address] [phone] Fax : [phone]  Primary Care Physician:  Ashley Haven, MD Primary Gastroenterologist:  Dr. [place] Contreras  Pre-Procedure History & Physical: HPI:  Ashley Contreras is a 56 y.o. female is here for an endoscopy and colonoscopy.   Past Medical History:  Diagnosis Date  . Abnormal Pap smear 1989   s/p colposcopy  . Allergy    hay fever  . Arthritis   . Asthma   . Chicken pox   . Colon polyp   . Complication of anesthesia   . Family history of adverse reaction to anesthesia    sister - slow to wake  . GERD (gastroesophageal reflux disease)   . Mitral valve prolapse   . PONV (postoperative nausea and vomiting)   . Shingles    waist, right side  . UTI (lower urinary tract infection)   . Wears contact lenses     Past Surgical History:  Procedure Laterality Date  . ABDOMINAL HYSTERECTOMY  2008  . BREAST BIOPSY     left, benign  . calcaneous osteotomy  06/2011  . TONSILLECTOMY AND ADENOIDECTOMY  2008    Prior to Admission medications   Medication Sig Start Date End Date Taking? Authorizing Provider  acetaminophen (TYLENOL) 500 MG tablet Take 500 mg by mouth every 4 (four) hours as needed.   Yes [provider]  CALCIUM GLUCONATE PO Take 1 tablet by mouth daily.   Yes [provider]  cholecalciferol (VITAMIN D) 1000 units tablet Take 1,000 Units by mouth daily.   Yes [provider]  estradiol (VIVELLE-DOT) 0.05 MG/24HR patch PLACE 1 PATCH ONTO THE SKIN 2 TIMES A WEEK 10/10/16  Yes Ashley Haven, MD  loratadine-pseudoephedrine (CLARITIN-D 12-HOUR) 5-120 MG per tablet Take 1 tablet by mouth daily. 11/02/12  Yes Ashley Confer, MD  pantoprazole (PROTONIX) 40 MG tablet Take 1 tablet (40 mg total) by mouth 2 (two) times daily. 12/19/14  Yes Ashley Confer, MD  valACYclovir (VALTREX) 1000 MG tablet Take 2 tablets at the onset of a fever blister and then  take 2 tablets 12 hours later. As needed 02/05/16  Yes Ashley Haven, MD  fluticasone [facility]) 50 MCG/ACT nasal spray Place into both nostrils daily.    [provider]    Allergies as of 01/27/2017 - Review Complete 01/26/2017  Allergen Reaction Noted  . Penicillins Swelling 10/21/2011  . Cinnamon  02/27/2015    Family History  Problem Relation Age of Onset  . Arthritis Mother   . Cancer Mother 27       breast  . Hypertension Mother   . Mental illness Mother   . Diabetes Mother   . Breast cancer Mother 12  . Hyperlipidemia Father   . Heart disease Father   . Cancer Father        lung  . Mental illness Sister   . Myasthenia gravis Sister   . Multiple sclerosis Sister   . Multiple sclerosis Other   . Stroke Maternal Aunt   . Cancer Maternal Aunt        breast  . Breast cancer Maternal Aunt   . Stroke Maternal Grandmother   . Hypertension Maternal Grandmother   . Diabetes Maternal Grandmother     Social History   Socioeconomic History  . Marital status: Married    Spouse name: Not on file  . Number of children: 2  . Years of education: Not  on file  . Highest education level: Not on file  Social Needs  . Financial resource strain: Not on file  . Food insecurity - worry: Not on file  . Food insecurity - inability: Not on file  . Transportation needs - medical: Not on file  . Transportation needs - non-medical: Not on file  Occupational History  . Occupation: Nurse, mental health: [place]  Tobacco Use  . Smoking status: Never Smoker  . Smokeless tobacco: Never Used  Substance and Sexual Activity  . Alcohol use: No    Comment: may have 4-5 glasses wine/yr  . Drug use: No  . Sexual activity: Not on file  Other Topics Concern  . Not on file  Social History Narrative   Lives in [place], 2 children. No pets.      Work - [employer]      Diet - regular, healthy   Exercise -bike sometimes, limited by injury    Review of  Systems: See HPI, otherwise negative ROS  Physical Exam: BP 107/63   Pulse 70   Temp 97.8 F (36.6 C) (Temporal)   Ht 5' 5.5" (1.664 m)   Wt 142 lb (64.4 kg)   SpO2 100%   BMI 23.27 kg/m  General:   Alert,  pleasant and cooperative in NAD Head:  Normocephalic and atraumatic. Neck:  Supple; no masses or thyromegaly. Lungs:  Clear throughout to auscultation.    Heart:  Regular rate and rhythm. Abdomen:  Soft, nontender and nondistended. Normal bowel sounds, without guarding, and without rebound.   Neurologic:  Alert and  oriented x4;  grossly normal neurologically.  Impression/Plan: Ashley Contreras is here for an endoscopy and colonoscopy to be performed for GERD and screening  Risks, benefits, limitations, and alternatives regarding  endoscopy and colonoscopy have been reviewed with the patient.  Questions have been answered.  All parties agreeable.   Ashley Lame, MD  02/17/2017, 8:41 AM

## 2017-02-17 NOTE — Anesthesia Procedure Notes (Signed)
Date/Time: 02/17/2017 9:26 AM Performed by: Cameron Ali, CRNA Pre-anesthesia Checklist: Patient identified, Emergency Drugs available, Suction available, Timeout performed and Patient being monitored Patient Re-evaluated:Patient Re-evaluated prior to induction Oxygen Delivery Method: Nasal cannula Placement Confirmation: positive ETCO2

## 2017-02-17 NOTE — Anesthesia Postprocedure Evaluation (Signed)
Anesthesia Post Note  Patient: Ashley Contreras  Procedure(s) Performed: COLONOSCOPY WITH PROPOFOL (N/A ) ESOPHAGOGASTRODUODENOSCOPY (EGD) WITH PROPOFOL (N/A )  Patient location during evaluation: PACU Anesthesia Type: General Level of consciousness: awake and alert and oriented Pain management: satisfactory to patient Vital Signs Assessment: post-procedure vital signs reviewed and stable Respiratory status: spontaneous breathing, nonlabored ventilation and respiratory function stable Cardiovascular status: blood pressure returned to baseline and stable Postop Assessment: Adequate PO intake and No signs of nausea or vomiting Anesthetic complications: no    Raliegh Ip

## 2017-02-17 NOTE — Anesthesia Preprocedure Evaluation (Signed)
Anesthesia Evaluation  Patient identified by MRN, date of birth, ID band Patient awake    Reviewed: Allergy & Precautions, H&P , NPO status , Patient's Chart, lab work & pertinent test results  History of Anesthesia Complications (+) PONV and history of anesthetic complications  Airway Mallampati: II  TM Distance: >3 FB Neck ROM: full    Dental no notable dental hx.    Pulmonary asthma ,    Pulmonary exam normal breath sounds clear to auscultation       Cardiovascular Normal cardiovascular exam Rhythm:regular Rate:Normal     Neuro/Psych    GI/Hepatic GERD  ,  Endo/Other    Renal/GU      Musculoskeletal   Abdominal   Peds  Hematology   Anesthesia Other Findings   Reproductive/Obstetrics                             Anesthesia Physical Anesthesia Plan  ASA: II  Anesthesia Plan: General   Post-op Pain Management:    Induction: Intravenous  PONV Risk Score and Plan: 3 and Propofol infusion and Treatment may vary due to age or medical condition  Airway Management Planned: Natural Airway  Additional Equipment:   Intra-op Plan:   Post-operative Plan:   Informed Consent: I have reviewed the patients History and Physical, chart, labs and discussed the procedure including the risks, benefits and alternatives for the proposed anesthesia with the patient or authorized representative who has indicated his/her understanding and acceptance.     Plan Discussed with: CRNA  Anesthesia Plan Comments:         Anesthesia Quick Evaluation

## 2017-02-17 NOTE — Op Note (Signed)
[facility] Gastroenterology Patient Name: Ashley Contreras Procedure Date: 02/17/2017 9:39 AM MRN: [id number] Account #: [id number] Date of Birth: [birth year] Admit Type: Outpatient Age: 56 Room: [facility] OR ROOM 01 Gender: Female Note Status: Finalized Procedure:            Colonoscopy Indications:          Screening for colorectal malignant neoplasm Providers:            Lucilla Lame MD, MD Referring MD:         Angela Adam. Caryl Bis (Referring MD) Medicines:            Propofol per Anesthesia Complications:        No immediate complications. Procedure:            Pre-Anesthesia Assessment:                       - Prior to the procedure, a History and Physical was                        performed, and patient medications and allergies were                        reviewed. The patient's tolerance of previous                        anesthesia was also reviewed. The risks and benefits of                        the procedure and the sedation options and risks were                        discussed with the patient. All questions were                        answered, and informed consent was obtained. Prior                        Anticoagulants: The patient has taken no previous                        anticoagulant or antiplatelet agents. ASA Grade                        Assessment: II - A patient with mild systemic disease.                        After reviewing the risks and benefits, the patient was                        deemed in satisfactory condition to undergo the                        procedure.                       After obtaining informed consent, the colonoscope was                        passed under direct vision. Throughout the procedure,  the patient's blood pressure, pulse, and oxygen                        saturations were monitored continuously. The Olympus                        CF-HQ190L Colonoscope (S#. [id number]) was introduced                     through the anus and advanced to the the cecum,                        identified by appendiceal orifice and ileocecal valve.                        The colonoscopy was performed without difficulty. The                        patient tolerated the procedure well. The quality of                        the bowel preparation was excellent. Findings:      The perianal and digital rectal examinations were normal.      Non-bleeding internal hemorrhoids were found during retroflexion. The       hemorrhoids were Grade I (internal hemorrhoids that do not prolapse).      The exam was otherwise without abnormality. Impression:           - The entire examined colon is normal.                       - No specimens collected. Recommendation:       - Discharge patient to home.                       - Resume previous diet.                       - Continue present medications. Procedure Code(s):    --- Professional ---                       [phone], Colonoscopy, flexible; diagnostic, including                        collection of specimen(s) by brushing or washing, when                        performed (separate procedure) Diagnosis Code(s):    --- Professional ---                       Z12.11, Encounter for screening for malignant neoplasm                        of colon CPT copyright 2016 American Medical Association. All rights reserved. The codes documented in this report are preliminary and upon coder review may  be revised to meet current compliance requirements. Lucilla Lame MD, MD 02/17/2017 9:56:51 AM This report has been signed electronically. Number of Addenda: 0 Note Initiated On: 02/17/2017 9:39 AM Scope Withdrawal Time: 0 hours 7 minutes 8 seconds  Total Procedure Duration: 0 hours 13 minutes 6  seconds       [facility]

## 2017-02-17 NOTE — Op Note (Addendum)
[facility] Patient Name: Ashley Contreras Procedure Date: 02/17/2017 9:21 AM MRN: [id number] Account #: [id number] Date of Birth: 04/15/1960 Admit Type: Outpatient Age: 56 Room: [facility] OR ROOM 01 Gender: Female Note Status: Supervisor Override Procedure:            Upper GI endoscopy Indications:          Heartburn Providers:            Lucilla Lame MD, MD Referring MD:         Angela Adam. Caryl Bis (Referring MD) Medicines:            Propofol per Anesthesia Complications:        No immediate complications. Procedure:            Pre-Anesthesia Assessment:                       - Prior to the procedure, a History and Physical was                        performed, and patient medications and allergies were                        reviewed. The patient's tolerance of previous                        anesthesia was also reviewed. The risks and benefits of                        the procedure and the sedation options and risks were                        discussed with the patient. All questions were                        answered, and informed consent was obtained. Prior                        Anticoagulants: The patient has taken no previous                        anticoagulant or antiplatelet agents. ASA Grade                        Assessment: II - A patient with mild systemic disease.                        After reviewing the risks and benefits, the patient was                        deemed in satisfactory condition to undergo the                        procedure.                       After obtaining informed consent, the endoscope was                        passed under direct vision. Throughout the procedure,  the patient's blood pressure, pulse, and oxygen                        saturations were monitored continuously. The Olympus                        GIF H180J Endoscope (S#: [id number]) was introduced   through the mouth, and advanced to the second part of                        duodenum. The upper GI endoscopy was accomplished                        without difficulty. The patient tolerated the procedure                        well. Findings:      The examined esophagus was normal.      The entire examined stomach was normal.      The examined duodenum was normal. Impression:           - Normal esophagus.                       - Normal stomach.                       - Normal examined duodenum.                       - No specimens collected. Recommendation:       - Discharge patient to home.                       - Resume previous diet.                       - Continue present medications.                       - Perform a colonoscopy today. Procedure Code(s):    --- Professional ---                       [phone], Esophagogastroduodenoscopy, flexible, transoral;                        diagnostic, including collection of specimen(s) by                        brushing or washing, when performed (separate procedure) Diagnosis Code(s):    --- Professional ---                       R12, Heartburn CPT copyright 2018 American Medical Association. All rights reserved. The codes documented in this report are preliminary and upon coder review may  be revised to meet current compliance requirements. Lucilla Lame MD, MD 02/17/2017 9:39:43 AM This report has been signed electronically. Number of Addenda: 0 Note Initiated On: 02/17/2017 9:21 AM      [facility], [place]

## 2017-02-19 ENCOUNTER — Encounter: Payer: Self-pay | Admitting: Family Medicine

## 2017-02-20 ENCOUNTER — Other Ambulatory Visit: Payer: Self-pay | Admitting: Family Medicine

## 2017-02-20 MED ORDER — VALACYCLOVIR HCL 1 G PO TABS: ORAL_TABLET | ORAL | 1 refills | Status: DC

## 2017-03-10 ENCOUNTER — Encounter: Payer: Self-pay | Admitting: Family Medicine

## 2017-03-17 ENCOUNTER — Encounter: Payer: Self-pay | Admitting: Family Medicine

## 2017-03-17 MED ORDER — PANTOPRAZOLE SODIUM 40 MG PO TBEC: 40.0000 mg | DELAYED_RELEASE_TABLET | Freq: Two times a day (BID) | ORAL | 1 refills | Status: DC

## 2017-03-17 NOTE — Telephone Encounter (Signed)
Last OV 04/29/16 last filled by Dr.Walker 12/19/14 180 1rf

## 2017-04-13 DIAGNOSIS — H524 Presbyopia: Secondary | ICD-10-CM | POA: Diagnosis not present

## 2017-04-23 ENCOUNTER — Encounter: Payer: Self-pay | Admitting: Family Medicine

## 2017-04-24 ENCOUNTER — Other Ambulatory Visit: Payer: Self-pay | Admitting: Family Medicine

## 2017-04-24 MED ORDER — ESTRADIOL 0.05 MG/24HR TD PTTW: MEDICATED_PATCH | TRANSDERMAL | 6 refills | Status: DC

## 2017-06-02 DIAGNOSIS — D2262 Melanocytic nevi of left upper limb, including shoulder: Secondary | ICD-10-CM | POA: Diagnosis not present

## 2017-06-02 DIAGNOSIS — Z09 Encounter for follow-up examination after completed treatment for conditions other than malignant neoplasm: Secondary | ICD-10-CM | POA: Diagnosis not present

## 2017-06-02 DIAGNOSIS — D225 Melanocytic nevi of trunk: Secondary | ICD-10-CM | POA: Diagnosis not present

## 2017-06-02 DIAGNOSIS — L821 Other seborrheic keratosis: Secondary | ICD-10-CM | POA: Diagnosis not present

## 2017-06-02 DIAGNOSIS — D2261 Melanocytic nevi of right upper limb, including shoulder: Secondary | ICD-10-CM | POA: Diagnosis not present

## 2017-06-02 DIAGNOSIS — D2271 Melanocytic nevi of right lower limb, including hip: Secondary | ICD-10-CM | POA: Diagnosis not present

## 2017-06-02 DIAGNOSIS — D2272 Melanocytic nevi of left lower limb, including hip: Secondary | ICD-10-CM | POA: Diagnosis not present

## 2017-06-02 DIAGNOSIS — B372 Candidiasis of skin and nail: Secondary | ICD-10-CM | POA: Diagnosis not present

## 2017-06-02 DIAGNOSIS — Z872 Personal history of diseases of the skin and subcutaneous tissue: Secondary | ICD-10-CM | POA: Diagnosis not present

## 2017-06-16 ENCOUNTER — Encounter: Payer: Self-pay | Admitting: Family Medicine

## 2017-06-16 ENCOUNTER — Other Ambulatory Visit (HOSPITAL_COMMUNITY)
Admission: RE | Admit: 2017-06-16 | Discharge: 2017-06-16 | Disposition: A | Discharge status: Home / Self Care | Payer: 59 | Source: Ambulatory Visit | Attending: Family Medicine | Admitting: Family Medicine

## 2017-06-16 ENCOUNTER — Other Ambulatory Visit: Payer: Self-pay

## 2017-06-16 ENCOUNTER — Ambulatory Visit (INDEPENDENT_AMBULATORY_CARE_PROVIDER_SITE_OTHER): Payer: 59 | Admitting: Family Medicine

## 2017-06-16 VITALS — BP 100/78 | HR 74 | Temp 98.3°F | Ht 65.5 in | Wt 142.4 lb

## 2017-06-16 DIAGNOSIS — Z8262 Family history of osteoporosis: Secondary | ICD-10-CM | POA: Diagnosis not present

## 2017-06-16 DIAGNOSIS — Z0001 Encounter for general adult medical examination with abnormal findings: Secondary | ICD-10-CM | POA: Diagnosis not present

## 2017-06-16 DIAGNOSIS — K219 Gastro-esophageal reflux disease without esophagitis: Secondary | ICD-10-CM | POA: Insufficient documentation

## 2017-06-16 DIAGNOSIS — E0590 Thyrotoxicosis, unspecified without thyrotoxic crisis or storm: Secondary | ICD-10-CM | POA: Diagnosis not present

## 2017-06-16 DIAGNOSIS — Z130 Encounter for screening for diseases of the blood and blood-forming organs and certain disorders involving the immune mechanism: Secondary | ICD-10-CM | POA: Diagnosis not present

## 2017-06-16 DIAGNOSIS — Z124 Encounter for screening for malignant neoplasm of cervix: Secondary | ICD-10-CM

## 2017-06-16 DIAGNOSIS — Z8619 Personal history of other infectious and parasitic diseases: Secondary | ICD-10-CM | POA: Insufficient documentation

## 2017-06-16 DIAGNOSIS — M1990 Unspecified osteoarthritis, unspecified site: Secondary | ICD-10-CM | POA: Diagnosis not present

## 2017-06-16 DIAGNOSIS — J45909 Unspecified asthma, uncomplicated: Secondary | ICD-10-CM | POA: Insufficient documentation

## 2017-06-16 DIAGNOSIS — R358 Other polyuria: Secondary | ICD-10-CM | POA: Diagnosis not present

## 2017-06-16 DIAGNOSIS — Z13220 Encounter for screening for lipoid disorders: Secondary | ICD-10-CM | POA: Diagnosis not present

## 2017-06-16 DIAGNOSIS — R3589 Other polyuria: Secondary | ICD-10-CM

## 2017-06-16 NOTE — Assessment & Plan Note (Addendum)
Physical exam completed.  Encouraged continued exercise and monitoring her diet.  Pap smear completed at patient request.  She will check with her insurance regarding Shingrix and HIV screening.  We will check an A1c and fasting glucose given increased thirst and frequent urination.  The frequent urination is a chronic issue and is unchanged.  She she will consider following up with urogynecology regarding this.  Fasting lab work will be obtained at a future date.  Patient request a bone density scan given family history of osteoporosis.  Her prior physician was doing these every 2 to 3 years.  I discussed that we could order this though her insurance may not cover this given that she does not meet the age criteria.  She is on hormone replacement and I discussed the risks and benefits of this.  We will continue this at this time.  She will monitor for signs of blood clot, cardiac issues, and stroke.

## 2017-06-16 NOTE — Patient Instructions (Signed)
Nice to see you. We will check lab work fasting at a future date. Please check with your insurance regarding the Shingrix vaccine for shingles. We will contact you with your Pap smear results. Please try to stay active and monitor what you eat.

## 2017-06-16 NOTE — Progress Notes (Signed)
Tommi Rumps, MD Phone: [phone]  Ashley Contreras is a 57 y.o. female who presents today for cpe.  Exercises twice a week with weights and cardio for 45 minutes. Eats fairly healthfully.  She is decreased her sweets. Mammogram up-to-date.  She does report a strong family history of breast cancer though BRCA testing has been done and she is negative per her report. Pap smear up-to-date though she reports she would like this yearly.  She is status post BSO and hysterectomy though her cervix is intact. Colonoscopy up-to-date. Tetanus vaccination up-to-date. She will check with her insurance company regarding Shingrix.  Tetanus vaccination up-to-date. Hepatitis C testing up-to-date. She will check with her insurance company regarding coverage of HIV screening. No tobacco use, alcohol use, or illicit drug use. She sees a Pharmacist, community every 6 months.  Ophthalmologist once yearly. She has had slight increased thirst and has chronic frequent urination that has been evaluated by urogynecology.  Active Ambulatory Problems    Diagnosis Date Noted  . Seasonal allergies 10/21/2011  . Cervical radiculopathy 11/03/2011  . Dyspareunia 04/25/2012  . GERD (gastroesophageal reflux disease) 09/21/2012  . Chronic gastritis 09/21/2012  . Personal history of colonic polyps 10/29/2012  . Encounter for general adult medical examination with abnormal findings 11/02/2012  . Left breast mass 04/24/2015  . Subclinical hyperthyroidism 04/24/2015  . Abdominal pain, chronic, right lower quadrant 01/29/2016  . Palpitations 01/29/2016  . Fatty liver 04/29/2016  . Special screening for malignant neoplasms, colon    Resolved Ambulatory Problems    Diagnosis Date Noted  . Left facial numbness 10/21/2011  . Sinusitis 04/25/2012  . Vaginal discharge 04/26/2012  . Dyspepsia 10/29/2012  . UTI (urinary tract infection) 06/04/2013  . Cold intolerance 06/04/2013  . Hematuria 08/23/2013  . Eczematous dermatitis of  eyelid 09/03/2013  . Dysuria 03/01/2014  . Suprapubic discomfort 11/29/2014   Past Medical History:  Diagnosis Date  . Abnormal Pap smear 1989  . Allergy   . Arthritis   . Asthma   . Chicken pox   . Colon polyp   . Complication of anesthesia   . Family history of adverse reaction to anesthesia   . GERD (gastroesophageal reflux disease)   . Mitral valve prolapse   . PONV (postoperative nausea and vomiting)   . Shingles   . UTI (lower urinary tract infection)   . Wears contact lenses     Family History  Problem Relation Age of Onset  . Arthritis Mother   . Cancer Mother 94       breast  . Hypertension Mother   . Mental illness Mother   . Diabetes Mother   . Breast cancer Mother 6  . Hyperlipidemia Father   . Heart disease Father   . Cancer Father        lung  . Mental illness Sister   . Myasthenia gravis Sister   . Multiple sclerosis Sister   . Multiple sclerosis Other   . Stroke Maternal Aunt   . Cancer Maternal Aunt        breast  . Breast cancer Maternal Aunt   . Stroke Maternal Grandmother   . Hypertension Maternal Grandmother   . Diabetes Maternal Grandmother     Social History   Socioeconomic History  . Marital status: Married    Spouse name: Not on file  . Number of children: 2  . Years of education: Not on file  . Highest education level: Not on file  Occupational History  . Occupation:  Care Manager    Employer: [employer]  Social Needs  . Financial resource strain: Not on file  . Food insecurity:    Worry: Not on file    Inability: Not on file  . Transportation needs:    Medical: Not on file    Non-medical: Not on file  Tobacco Use  . Smoking status: Never Smoker  . Smokeless tobacco: Never Used  Substance and Sexual Activity  . Alcohol use: No    Comment: may have 4-5 glasses wine/yr  . Drug use: No  . Sexual activity: Not on file  Lifestyle  . Physical activity:    Days per week: Not on file    Minutes per session: Not on file    . Stress: Not on file  Relationships  . Social connections:    Talks on phone: Not on file    Gets together: Not on file    Attends religious service: Not on file    Active member of club or organization: Not on file    Attends meetings of clubs or organizations: Not on file    Relationship status: Not on file  . Intimate partner violence:    Fear of current or ex partner: Not on file    Emotionally abused: Not on file    Physically abused: Not on file    Forced sexual activity: Not on file  Other Topics Concern  . Not on file  Social History Narrative   Lives in [place], 2 children. No pets.      Work - [employer]      Diet - regular, healthy   Exercise -bike sometimes, limited by injury    ROS  General:  Negative for nexplained weight loss, fever Skin: Negative for new or changing mole, sore that won't heal HEENT: Negative for trouble hearing, trouble seeing, ringing in ears, mouth sores, hoarseness, change in voice, dysphagia. CV:  Negative for chest pain, dyspnea, edema, palpitations Resp: Negative for cough, dyspnea, hemoptysis GI: Negative for nausea, vomiting, diarrhea, constipation, abdominal pain, melena, hematochezia. GU: Positive for frequent urination, negative for dysuria, incontinence, urinary hesitance, hematuria, vaginal or penile discharge, sexual difficulty, lumps in testicle or breasts MSK: Negative for muscle cramps or aches, joint pain or swelling Neuro: Negative for headaches, weakness, numbness, dizziness, passing out/fainting Psych: Negative for depression, anxiety, memory problems  Objective  Physical Exam Vitals:   06/16/17 1025  BP: 100/78  Pulse: 74  Temp: 98.3 F (36.8 C)  SpO2: 98%    BP Readings from Last 3 Encounters:  06/16/17 100/78  02/17/17 103/67  01/26/17 115/67   Wt Readings from Last 3 Encounters:  06/16/17 142 lb 6.4 oz (64.6 kg)  02/17/17 142 lb (64.4 kg)  01/26/17 144 lb (65.3 kg)    Physical Exam   Constitutional: No distress.  HENT:  Head: Normocephalic and atraumatic.  Mouth/Throat: Oropharynx is clear and moist.  Eyes: Pupils are equal, round, and reactive to light. Conjunctivae are normal.  Neck: Neck supple.  Cardiovascular: Normal rate, regular rhythm and normal heart sounds.  Pulmonary/Chest: Effort normal and breath sounds normal.  Abdominal: Soft. Bowel sounds are normal. She exhibits no distension. There is no tenderness. There is no rebound and no guarding.  Genitourinary:  Genitourinary Comments: Chaperone used, normal external labia, normal vaginal mucosa, normal-appearing cervix no cervical motion tenderness, no adnexal masses palpated, bilateral breasts with no skin changes, nipple inversion, tenderness, or masses, no axillary masses bilaterally  Musculoskeletal: She exhibits no edema.  Lymphadenopathy:    She has no cervical adenopathy.  Neurological: She is alert.  Skin: Skin is warm and dry. She is not diaphoretic.  Psychiatric: She has a normal mood and affect.     Assessment/Plan:   Encounter for general adult medical examination with abnormal findings Physical exam completed.  Encouraged continued exercise and monitoring her diet.  Pap smear completed at patient request.  She will check with her insurance regarding Shingrix and HIV screening.  We will check an A1c and fasting glucose given increased thirst and frequent urination.  The frequent urination is a chronic issue and is unchanged.  She she will consider following up with urogynecology regarding this.  Fasting lab work will be obtained at a future date.  Patient request a bone density scan given family history of osteoporosis.  Her prior physician was doing these every 2 to 3 years.  I discussed that we could order this though her insurance may not cover this given that she does not meet the age criteria.  She is on hormone replacement and I discussed the risks and benefits of this.  We will continue this  at this time.  She will monitor for signs of blood clot, cardiac issues, and stroke.   Orders Placed This Encounter  Procedures  . DG Bone Density    Standing Status:   Future    Standing Expiration Date:   06/17/2018    Order Specific Question:   Reason for Exam (SYMPTOM  OR DIAGNOSIS REQUIRED)    Answer:   family history of osteoporosis, postmenopausal    Order Specific Question:   Is the patient pregnant?    Answer:   No    Order Specific Question:   Preferred imaging location?    Answer:   [facility]  . Lipid panel    Standing Status:   Future    Standing Expiration Date:   06/17/2018  . Comp Met (CMET)    Standing Status:   Future    Standing Expiration Date:   06/17/2018  . CBC    Standing Status:   Future    Standing Expiration Date:   06/17/2018  . TSH    Standing Status:   Future    Standing Expiration Date:   06/17/2018  . T4, free    Standing Status:   Future    Standing Expiration Date:   06/17/2018  . T3, free    Standing Status:   Future    Standing Expiration Date:   06/17/2018  . HgB A1c    Standing Status:   Future    Standing Expiration Date:   06/17/2018    No orders of the defined types were placed in this encounter.    Tommi Rumps, MD [place]

## 2017-06-20 LAB — CYTOLOGY - PAP
DIAGNOSIS: NEGATIVE
HPV: NOT DETECTED

## 2017-06-28 ENCOUNTER — Other Ambulatory Visit: Payer: Self-pay

## 2017-06-29 ENCOUNTER — Encounter: Payer: Self-pay | Admitting: Family

## 2017-06-30 ENCOUNTER — Ambulatory Visit: Payer: Self-pay | Admitting: Family

## 2017-06-30 ENCOUNTER — Other Ambulatory Visit (INDEPENDENT_AMBULATORY_CARE_PROVIDER_SITE_OTHER): Payer: 59

## 2017-06-30 DIAGNOSIS — Z13220 Encounter for screening for lipoid disorders: Secondary | ICD-10-CM | POA: Diagnosis not present

## 2017-06-30 DIAGNOSIS — Z130 Encounter for screening for diseases of the blood and blood-forming organs and certain disorders involving the immune mechanism: Secondary | ICD-10-CM

## 2017-06-30 DIAGNOSIS — R358 Other polyuria: Secondary | ICD-10-CM | POA: Diagnosis not present

## 2017-06-30 DIAGNOSIS — R3589 Other polyuria: Secondary | ICD-10-CM

## 2017-06-30 DIAGNOSIS — E0590 Thyrotoxicosis, unspecified without thyrotoxic crisis or storm: Secondary | ICD-10-CM | POA: Diagnosis not present

## 2017-06-30 LAB — COMPREHENSIVE METABOLIC PANEL
ALBUMIN: 4.2 g/dL (ref 3.5–5.2)
ALT: 13 U/L (ref 0–35)
AST: 12 U/L (ref 0–37)
Alkaline Phosphatase: 57 U/L (ref 39–117)
BILIRUBIN TOTAL: 0.3 mg/dL (ref 0.2–1.2)
BUN: 14 mg/dL (ref 6–23)
CALCIUM: 9.2 mg/dL (ref 8.4–10.5)
CO2: 30 meq/L (ref 19–32)
CREATININE: 0.72 mg/dL (ref 0.40–1.20)
Chloride: 104 mEq/L (ref 96–112)
GFR: 88.87 mL/min (ref >60.00)
Glucose, Bld: 100 mg/dL — ABNORMAL HIGH (ref 70–99)
Potassium: 4.9 mEq/L (ref 3.5–5.1)
SODIUM: 139 meq/L (ref 135–145)
Total Protein: 6.5 g/dL (ref 6.0–8.3)

## 2017-06-30 LAB — T4, FREE: FREE T4: 0.74 ng/dL (ref 0.60–1.60)

## 2017-06-30 LAB — LIPID PANEL
CHOL/HDL RATIO: 3
Cholesterol: 193 mg/dL (ref 0–200)
HDL: 59.7 mg/dL (ref >39.00)
LDL Cholesterol: 120 mg/dL — ABNORMAL HIGH (ref 0–99)
NonHDL: 133.25
TRIGLYCERIDES: 68 mg/dL (ref 0.0–149.0)
VLDL: 13.6 mg/dL (ref 0.0–40.0)

## 2017-06-30 LAB — HEMOGLOBIN A1C: Hgb A1c MFr Bld: 5.4 % (ref 4.6–6.5)

## 2017-06-30 LAB — CBC
HCT: 42.7 % (ref 36.0–46.0)
Hemoglobin: 14.6 g/dL (ref 12.0–15.0)
MCHC: 34.1 g/dL (ref 30.0–36.0)
MCV: 95.6 fl (ref 78.0–100.0)
PLATELETS: 225 10*3/uL (ref 150.0–400.0)
RBC: 4.46 Mil/uL (ref 3.87–5.11)
RDW: 12.9 % (ref 11.5–15.5)
WBC: 5.3 10*3/uL (ref 4.0–10.5)

## 2017-06-30 LAB — T3, FREE: T3, Free: 3 pg/mL (ref 2.3–4.2)

## 2017-06-30 LAB — TSH: TSH: 0.75 u[IU]/mL (ref 0.35–4.50)

## 2017-07-04 ENCOUNTER — Encounter: Payer: Self-pay | Admitting: Family Medicine

## 2017-07-12 DIAGNOSIS — R42 Dizziness and giddiness: Secondary | ICD-10-CM | POA: Diagnosis not present

## 2017-07-12 DIAGNOSIS — J309 Allergic rhinitis, unspecified: Secondary | ICD-10-CM | POA: Diagnosis not present

## 2017-07-12 DIAGNOSIS — H6121 Impacted cerumen, right ear: Secondary | ICD-10-CM | POA: Diagnosis not present

## 2017-07-12 DIAGNOSIS — J340 Abscess, furuncle and carbuncle of nose: Secondary | ICD-10-CM | POA: Diagnosis not present

## 2017-07-31 NOTE — Telephone Encounter (Signed)
Requested results from [facility], abstracted and placed in blue folder

## 2017-08-03 DIAGNOSIS — R42 Dizziness and giddiness: Secondary | ICD-10-CM | POA: Diagnosis not present

## 2017-08-16 ENCOUNTER — Ambulatory Visit
Admission: RE | Admit: 2017-08-16 | Discharge: 2017-08-16 | Disposition: A | Discharge status: Home / Self Care | Payer: 59 | Source: Ambulatory Visit | Attending: Family Medicine | Admitting: Family Medicine

## 2017-08-16 DIAGNOSIS — Z13820 Encounter for screening for osteoporosis: Secondary | ICD-10-CM | POA: Diagnosis not present

## 2017-08-16 DIAGNOSIS — Z8262 Family history of osteoporosis: Secondary | ICD-10-CM | POA: Insufficient documentation

## 2017-08-16 DIAGNOSIS — Z780 Asymptomatic menopausal state: Secondary | ICD-10-CM | POA: Diagnosis not present

## 2017-08-18 DIAGNOSIS — Z87448 Personal history of other diseases of urinary system: Secondary | ICD-10-CM | POA: Diagnosis not present

## 2017-08-18 DIAGNOSIS — N951 Menopausal and female climacteric states: Secondary | ICD-10-CM | POA: Diagnosis not present

## 2017-09-13 ENCOUNTER — Other Ambulatory Visit: Payer: Self-pay | Admitting: Unknown Physician Specialty

## 2017-09-13 DIAGNOSIS — R519 Headache, unspecified: Secondary | ICD-10-CM

## 2017-09-13 DIAGNOSIS — J309 Allergic rhinitis, unspecified: Secondary | ICD-10-CM | POA: Diagnosis not present

## 2017-09-13 DIAGNOSIS — R51 Headache: Principal | ICD-10-CM

## 2017-09-13 DIAGNOSIS — H6980 Other specified disorders of Eustachian tube, unspecified ear: Secondary | ICD-10-CM | POA: Diagnosis not present

## 2017-09-19 ENCOUNTER — Ambulatory Visit
Admission: RE | Admit: 2017-09-19 | Discharge: 2017-09-19 | Disposition: A | Discharge status: Home / Self Care | Payer: 59 | Source: Ambulatory Visit | Attending: Unknown Physician Specialty | Admitting: Unknown Physician Specialty

## 2017-09-19 DIAGNOSIS — R51 Headache: Secondary | ICD-10-CM | POA: Insufficient documentation

## 2017-09-19 DIAGNOSIS — J342 Deviated nasal septum: Secondary | ICD-10-CM | POA: Diagnosis not present

## 2017-09-19 DIAGNOSIS — R519 Headache, unspecified: Secondary | ICD-10-CM

## 2017-09-19 DIAGNOSIS — R0981 Nasal congestion: Secondary | ICD-10-CM | POA: Diagnosis not present

## 2017-10-30 DIAGNOSIS — R51 Headache: Secondary | ICD-10-CM | POA: Diagnosis not present

## 2017-10-31 DIAGNOSIS — G8929 Other chronic pain: Secondary | ICD-10-CM | POA: Insufficient documentation

## 2017-11-17 ENCOUNTER — Encounter: Payer: Self-pay | Admitting: Family Medicine

## 2017-11-17 DIAGNOSIS — E0590 Thyrotoxicosis, unspecified without thyrotoxic crisis or storm: Secondary | ICD-10-CM

## 2017-11-23 DIAGNOSIS — H6123 Impacted cerumen, bilateral: Secondary | ICD-10-CM | POA: Diagnosis not present

## 2017-11-23 DIAGNOSIS — R42 Dizziness and giddiness: Secondary | ICD-10-CM | POA: Diagnosis not present

## 2017-11-23 DIAGNOSIS — R51 Headache: Secondary | ICD-10-CM | POA: Diagnosis not present

## 2017-11-23 DIAGNOSIS — H903 Sensorineural hearing loss, bilateral: Secondary | ICD-10-CM | POA: Diagnosis not present

## 2017-11-24 ENCOUNTER — Encounter: Payer: Self-pay | Admitting: Family Medicine

## 2017-12-11 DIAGNOSIS — J309 Allergic rhinitis, unspecified: Secondary | ICD-10-CM | POA: Diagnosis not present

## 2017-12-11 DIAGNOSIS — E0590 Thyrotoxicosis, unspecified without thyrotoxic crisis or storm: Secondary | ICD-10-CM | POA: Diagnosis not present

## 2017-12-11 DIAGNOSIS — H903 Sensorineural hearing loss, bilateral: Secondary | ICD-10-CM | POA: Diagnosis not present

## 2017-12-26 DIAGNOSIS — J309 Allergic rhinitis, unspecified: Secondary | ICD-10-CM | POA: Diagnosis not present

## 2017-12-26 DIAGNOSIS — H8102 Meniere's disease, left ear: Secondary | ICD-10-CM | POA: Diagnosis not present

## 2017-12-26 DIAGNOSIS — H903 Sensorineural hearing loss, bilateral: Secondary | ICD-10-CM | POA: Diagnosis not present

## 2017-12-28 DIAGNOSIS — H903 Sensorineural hearing loss, bilateral: Secondary | ICD-10-CM | POA: Diagnosis not present

## 2018-01-22 ENCOUNTER — Encounter: Payer: Self-pay | Admitting: Neurology

## 2018-01-22 ENCOUNTER — Telehealth: Payer: Self-pay | Admitting: Neurology

## 2018-01-22 ENCOUNTER — Ambulatory Visit (INDEPENDENT_AMBULATORY_CARE_PROVIDER_SITE_OTHER): Payer: 59 | Admitting: Neurology

## 2018-01-22 VITALS — BP 106/63 | HR 77 | Ht 66.0 in | Wt 143.0 lb

## 2018-01-22 DIAGNOSIS — R51 Headache: Secondary | ICD-10-CM | POA: Diagnosis not present

## 2018-01-22 DIAGNOSIS — R29898 Other symptoms and signs involving the musculoskeletal system: Secondary | ICD-10-CM

## 2018-01-22 DIAGNOSIS — R4701 Aphasia: Secondary | ICD-10-CM | POA: Diagnosis not present

## 2018-01-22 DIAGNOSIS — M5481 Occipital neuralgia: Secondary | ICD-10-CM | POA: Diagnosis not present

## 2018-01-22 DIAGNOSIS — R42 Dizziness and giddiness: Secondary | ICD-10-CM

## 2018-01-22 DIAGNOSIS — M5412 Radiculopathy, cervical region: Secondary | ICD-10-CM | POA: Diagnosis not present

## 2018-01-22 DIAGNOSIS — H814 Vertigo of central origin: Secondary | ICD-10-CM | POA: Diagnosis not present

## 2018-01-22 DIAGNOSIS — R519 Headache, unspecified: Secondary | ICD-10-CM

## 2018-01-22 NOTE — Patient Instructions (Addendum)
MRI of the brain and cervical spine

## 2018-01-22 NOTE — Telephone Encounter (Signed)
[facility] auth: NPR patient wanted to have her MRi's at [facility] the first available that I scheduled it for is 02/05/18 arrival time is 6:30 pm. I left her their number of [phone] incase she needed to r/s for any reason.. I left a vmail with all of this information.

## 2018-01-22 NOTE — Progress Notes (Signed)
[facility]    Provider:  Dr Jaynee Eagles Referring Provider: Leone Haven, MD Primary Care Physician:  Leone Haven, MD  CC:  headaches  HPI:  ZAHRAH SUTHERLIN is a 57 y.o. female here as requested by Dr. Caryl Bis for headache. No hx of migraines. Her daughter has migraines but no other family memner. They started in July of this year. Also problems with ehr neck. The headaches would be frontal with pressure, and numbness at the back pf the head radiating to the top of the head. She went to ENT because she thought it was sinus issues. She has associated dizziness and fullness in the left ear. 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 Since stopping the pseudoephedrine the headaches are better. She was started on Flonase. Still continued to feel dizzy with left ear fullness. Dr. Melrose Nakayama neurology Dxed her with meniere's and started her on Triamterene/HCTZ and Nortriptyline and then she saw Dr. Thornell Mule who said she has a spectrum of Meniere's and continued her on the HCTZ. She feels there eis a correlation with salt. Watching salt has helped, she had some Ham over the holidays and started feeling dizzy again. Headaches are throbbing, no light or sound sensitivity or nausea. She gets headaches with the change of weather. She feels off balance, like come off a ride. The headaches are not often but they are occipital, she has associated dizziness, and extreme fatigue, she has difficulty with speech difficulty getting the words out. She also has >1 year of chronic neck pain, radicular symptoms into the right arm with right arm weakness, has had conservative therapy including personal trainer, massage, OTC NSAIDS. No other focal neurologic deficits, associated symptoms, inciting events or modifiable factors.  Reviewed notes, labs and imaging from outside physicians, which showed:  cmp normal 06/30/2017  Mri c-spine 05/2009: mild degen changes at c4-c5, c5-c6 without significant foraminal  stenosis or central stenosis personally reviewed images  Mri brain 09/2012: personally reviewed images and was normal  Review of Systems: Patient complains of symptoms per HPI as well as the following symptoms: headache and neck pain. Pertinent negatives and positives per HPI. All others negative.   Social History   Socioeconomic History  . Marital status: Married    Spouse name: Not on file  . Number of children: 2  . Years of education: Not on file  . Highest education level: Not on file  Occupational History  . Occupation: Nurse, mental health: [employer]  . Financial resource strain: Not on file  . Food insecurity:    Worry: Not on file    Inability: Not on file  . Transportation needs:    Medical: Not on file    Non-medical: Not on file  Tobacco Use  . Smoking status: Never Smoker  . Smokeless tobacco: Never Used  Substance and Sexual Activity  . Alcohol use: No  . Drug use: No  . Sexual activity: Not on file  Lifestyle  . Physical activity:    Days per week: Not on file    Minutes per session: Not on file  . Stress: Not on file  Relationships  . Social connections:    Talks on phone: Not on file    Gets together: Not on file    Attends religious service: Not on file    Active member of club or organization: Not on file    Attends meetings of clubs or organizations: Not on file  Relationship status: Not on file  . Intimate partner violence:    Fear of current or ex partner: Not on file    Emotionally abused: Not on file    Physically abused: Not on file    Forced sexual activity: Not on file  Other Topics Concern  . Not on file  Social History Narrative   Lives in [place], 2 children. No pets.      Work - [employer]      Diet - regular, healthy   Exercise -bike sometimes, limited by injury    Family History  Problem Relation Age of Onset  . Arthritis Mother   . Cancer Mother 59       breast  . Hypertension Mother   .  Mental illness Mother   . Diabetes Mother   . Breast cancer Mother 51  . Hyperlipidemia Father   . Heart disease Father   . Cancer Father        lung  . Mental illness Sister   . Myasthenia gravis Sister   . Multiple sclerosis Sister   . Multiple sclerosis Other   . Stroke Maternal Aunt   . Cancer Maternal Aunt        breast  . Breast cancer Maternal Aunt   . Stroke Maternal Grandmother   . Hypertension Maternal Grandmother   . Diabetes Maternal Grandmother     Past Medical History:  Diagnosis Date  . Abnormal Pap smear 1989   s/p colposcopy  . Allergy    hay fever  . Arthritis   . Asthma   . Chicken pox   . Colon polyp   . Complication of anesthesia   . Family history of adverse reaction to anesthesia    sister - slow to wake  . GERD (gastroesophageal reflux disease)   . Mitral valve prolapse   . PONV (postoperative nausea and vomiting)   . Shingles    waist, right side  . UTI (lower urinary tract infection)   . Wears contact lenses     Past Surgical History:  Procedure Laterality Date  . ABDOMINAL HYSTERECTOMY  2008  . BREAST BIOPSY     left, benign  . calcaneous osteotomy  06/2011  . COLONOSCOPY WITH PROPOFOL N/A 02/17/2017   Procedure: COLONOSCOPY WITH PROPOFOL;  Surgeon: Lucilla Lame, MD;  Location: [facility];  Service: Endoscopy;  Laterality: N/A;  . ESOPHAGOGASTRODUODENOSCOPY (EGD) WITH PROPOFOL N/A 02/17/2017   Procedure: ESOPHAGOGASTRODUODENOSCOPY (EGD) WITH PROPOFOL;  Surgeon: Lucilla Lame, MD;  Location: [facility];  Service: Endoscopy;  Laterality: N/A;  . foot and ankle surgery    . TONSILLECTOMY AND ADENOIDECTOMY  2008    Current Outpatient Medications  Medication Sig Dispense Refill  . acetaminophen (TYLENOL) 500 MG tablet Take 500 mg by mouth every 4 (four) hours as needed.    . calcium-vitamin D (OSCAL WITH D) 500-200 MG-UNIT TABS tablet Take by mouth.    . estradiol (VIVELLE-DOT) 0.075 MG/24HR Place 1 patch onto the skin  2 (two) times a week.    . fluticasone (FLONASE) 50 MCG/ACT nasal spray Place into the nose.    . loratadine (CLARITIN) 10 MG tablet Take by mouth.    . pantoprazole (PROTONIX) 40 MG tablet Take 1 tablet (40 mg total) by mouth 2 (two) times daily. 180 tablet 1  . valACYclovir (VALTREX) 1000 MG tablet Take 2 tablets at the onset of a fever blister and then take 2 tablets 12 hours later. As needed 30 tablet  1   No current facility-administered medications for this visit.     Allergies as of 01/22/2018 - Review Complete 01/22/2018  Allergen Reaction Noted  . Penicillins Swelling 10/21/2011  . Cinnamon  02/27/2015    Vitals: BP 106/63   Pulse 77   Ht 5\' 6"  (1.676 m)   Wt 143 lb (64.9 kg)   BMI 23.08 kg/m  Last Weight:  Wt Readings from Last 1 Encounters:  01/22/18 143 lb (64.9 kg)   Last Height:   Ht Readings from Last 1 Encounters:  01/22/18 5\' 6"  (1.676 m)   Physical exam: Exam: Gen: NAD, conversant, well nourised, well groomed                     CV: RRR, no MRG. No Carotid Bruits. No peripheral edema, warm, nontender Eyes: Conjunctivae clear without exudates or hemorrhage  Neuro: Detailed Neurologic Exam  Speech:    Speech is normal; fluent and spontaneous with normal comprehension.  Cognition:    The patient is oriented to person, place, and time;     recent and remote memory intact;     language fluent;     normal attention, concentration,     fund of knowledge Cranial Nerves:    The pupils are equal, round, and reactive to light. The fundi are normal and spontaneous venous pulsations are present. Visual fields are full to finger confrontation. Extraocular movements are intact. Trigeminal sensation is intact and the muscles of mastication are normal. The face is symmetric. The palate elevates in the midline. Hearing intact. Voice is normal. Shoulder shrug is normal. The tongue has normal motion without fasciculations.   Coordination:    Normal finger to nose and  heel to shin. Normal rapid alternating movements.   Gait:    Heel-toe and tandem gait are normal.   Motor Observation:    No asymmetry, no atrophy, and no involuntary movements noted. Tone:    Normal muscle tone.    Posture:    Posture is normal. normal erect    Strength: right prox deltoid 4/5, right triceps 4+/5 otherwise strength is V/V in the upper and lower limbs.      Sensation: intact to LT     Reflex Exam:  DTR's:    Deep tendon reflexes in the upper and lower extremities are normal bilaterally.   Toes:    The toes are downgoing bilaterally.   Clonus:    Clonus is absent.       Assessment/Plan: This is a 57 year old female without significant past medical history with new onset frontal headaches and occipital headaches associated with vertigo and dizziness.  May be probable migraines or vestibular migraines versus hydrops as decreasing salt has improved symptoms however given concerning complaints including new onset headache after the age of 74, occipital in nature, vertigo, neck pain, radiculopathy, decreased strength proximal right arm recommend MRI of the brain with and without contrast to evaluate for strokes, lesions, space-occupying masses, vestibular schwannoma. She also has >1 year of chronic neck pain, radicular symptoms into the right arm with right arm weakness, has had conservative therapy including personal trainer, massage, OTC NSAIDS also recommend MRI of the cervical spine to evaluate for cervical radiculopathy causing right arm weakness or C2-C3 nerve root impingement causing her occipital headache to evaluate for epidural steroid injections or surgical interventions.  Recommend stretching, heating, massage, PT: she prefers massage at tis time, declines PT or dry needling for cervical myofascial pain   Orders Placed  This Encounter  Procedures  . MR BRAIN W WO CONTRAST  . MR CERVICAL SPINE WO CONTRAST   Discussed: To prevent or relieve headaches, try  the following: Cool Compress. Lie down and place a cool compress on your head.  Avoid headache triggers. If certain foods or odors seem to have triggered your migraines in the past, avoid them. A headache diary might help you identify triggers.  Include physical activity in your daily routine. Try a daily walk or other moderate aerobic exercise.  Manage stress. Find healthy ways to cope with the stressors, such as delegating tasks on your to-do list.  Practice relaxation techniques. Try deep breathing, yoga, massage and visualization.  Eat regularly. Eating regularly scheduled meals and maintaining a healthy diet might help prevent headaches. Also, drink plenty of fluids.  Follow a regular sleep schedule. Sleep deprivation might contribute to headaches Consider biofeedback. With this mind-body technique, you learn to control certain bodily functions - such as muscle tension, heart rate and blood pressure - to prevent headaches or reduce headache pain.    Proceed to emergency room if you experience new or worsening symptoms or symptoms do not resolve, if you have new neurologic symptoms or if headache is severe, or for any concerning symptom.   Provided education and documentation from American headache Society toolbox including articles on: chronic migraine medication overuse headache, chronic migraines, prevention of migraines, behavioral and other nonpharmacologic treatments for headache.   Sarina Ill, MD  [facility] [address] [place], [place] [address]  Phone [phone] Fax [phone]

## 2018-02-04 DIAGNOSIS — N390 Urinary tract infection, site not specified: Secondary | ICD-10-CM | POA: Diagnosis not present

## 2018-02-05 ENCOUNTER — Ambulatory Visit
Admission: RE | Admit: 2018-02-05 | Discharge: 2018-02-05 | Disposition: A | Discharge status: Home / Self Care | Payer: 59 | Source: Ambulatory Visit | Attending: Neurology | Admitting: Neurology

## 2018-02-05 DIAGNOSIS — M5412 Radiculopathy, cervical region: Secondary | ICD-10-CM | POA: Insufficient documentation

## 2018-02-05 DIAGNOSIS — R519 Headache, unspecified: Secondary | ICD-10-CM

## 2018-02-05 DIAGNOSIS — M4802 Spinal stenosis, cervical region: Secondary | ICD-10-CM | POA: Diagnosis not present

## 2018-02-05 DIAGNOSIS — R29898 Other symptoms and signs involving the musculoskeletal system: Secondary | ICD-10-CM

## 2018-02-05 DIAGNOSIS — M5481 Occipital neuralgia: Secondary | ICD-10-CM | POA: Diagnosis not present

## 2018-02-05 DIAGNOSIS — R4701 Aphasia: Secondary | ICD-10-CM

## 2018-02-05 DIAGNOSIS — R51 Headache: Secondary | ICD-10-CM | POA: Insufficient documentation

## 2018-02-05 DIAGNOSIS — M50322 Other cervical disc degeneration at C5-C6 level: Secondary | ICD-10-CM | POA: Diagnosis not present

## 2018-02-05 DIAGNOSIS — H814 Vertigo of central origin: Secondary | ICD-10-CM | POA: Diagnosis not present

## 2018-02-05 DIAGNOSIS — M50323 Other cervical disc degeneration at C6-C7 level: Secondary | ICD-10-CM | POA: Insufficient documentation

## 2018-02-05 DIAGNOSIS — R42 Dizziness and giddiness: Secondary | ICD-10-CM

## 2018-02-05 DIAGNOSIS — M50223 Other cervical disc displacement at C6-C7 level: Secondary | ICD-10-CM | POA: Diagnosis not present

## 2018-02-05 MED ORDER — GADOBUTROL 1 MMOL/ML IV SOLN
6.5000 mL | Freq: Once | INTRAVENOUS | Status: AC | PRN
  Administered 2018-02-05: 6.5 mL via INTRAVENOUS

## 2018-02-19 ENCOUNTER — Encounter: Payer: Self-pay | Admitting: Family Medicine

## 2018-02-19 DIAGNOSIS — Z1239 Encounter for other screening for malignant neoplasm of breast: Secondary | ICD-10-CM

## 2018-03-12 ENCOUNTER — Encounter: Payer: Self-pay | Admitting: Family Medicine

## 2018-03-15 ENCOUNTER — Telehealth: Payer: Self-pay | Admitting: Family Medicine

## 2018-03-15 NOTE — Telephone Encounter (Signed)
Please review. Thanks!  

## 2018-03-15 NOTE — Telephone Encounter (Signed)
Pt says she spoke with Dr. Rosanna Randy and he will do a CPE for her.  She was a pt of [facility] at one time. Please verify and schedule appt.  Please call pt back at work number.  Thanks, [employer]

## 2018-03-15 NOTE — Telephone Encounter (Signed)
Happy to see

## 2018-03-23 DIAGNOSIS — H903 Sensorineural hearing loss, bilateral: Secondary | ICD-10-CM | POA: Diagnosis not present

## 2018-03-23 DIAGNOSIS — H8109 Meniere's disease, unspecified ear: Secondary | ICD-10-CM | POA: Diagnosis not present

## 2018-03-23 DIAGNOSIS — J301 Allergic rhinitis due to pollen: Secondary | ICD-10-CM | POA: Diagnosis not present

## 2018-03-23 DIAGNOSIS — H6123 Impacted cerumen, bilateral: Secondary | ICD-10-CM | POA: Diagnosis not present

## 2018-04-03 ENCOUNTER — Ambulatory Visit
Admission: RE | Admit: 2018-04-03 | Discharge: 2018-04-03 | Disposition: A | Discharge status: Home / Self Care | Payer: 59 | Source: Ambulatory Visit | Attending: Family Medicine | Admitting: Family Medicine

## 2018-04-03 DIAGNOSIS — Z1231 Encounter for screening mammogram for malignant neoplasm of breast: Secondary | ICD-10-CM | POA: Diagnosis not present

## 2018-04-03 DIAGNOSIS — Z1239 Encounter for other screening for malignant neoplasm of breast: Secondary | ICD-10-CM | POA: Insufficient documentation

## 2018-04-05 ENCOUNTER — Telehealth: Payer: 59 | Admitting: Physician Assistant

## 2018-04-05 DIAGNOSIS — J208 Acute bronchitis due to other specified organisms: Secondary | ICD-10-CM

## 2018-04-05 MED ORDER — BENZONATATE 100 MG PO CAPS: 100.0000 mg | ORAL_CAPSULE | Freq: Three times a day (TID) | ORAL | 0 refills | Status: DC | PRN

## 2018-04-05 NOTE — Progress Notes (Signed)

## 2018-04-05 NOTE — Progress Notes (Signed)
I have spent 5 minutes in review of chart, questionnaire, and in time for medical decision making.   Leeanne Rio, PA-C

## 2018-04-07 ENCOUNTER — Ambulatory Visit
Admission: EM | Admit: 2018-04-07 | Discharge: 2018-04-07 | Disposition: A | Discharge status: Home / Self Care | Payer: 59 | Attending: Emergency Medicine | Admitting: Emergency Medicine

## 2018-04-07 DIAGNOSIS — J069 Acute upper respiratory infection, unspecified: Secondary | ICD-10-CM

## 2018-04-07 MED ORDER — HYDROCOD POLST-CPM POLST ER 10-8 MG/5ML PO SUER: 5.0000 mL | Freq: Two times a day (BID) | ORAL | 0 refills | Status: DC

## 2018-04-07 MED ORDER — DOXYCYCLINE HYCLATE 100 MG PO CAPS: 100.0000 mg | ORAL_CAPSULE | Freq: Two times a day (BID) | ORAL | 0 refills | Status: DC

## 2018-04-07 MED ORDER — METHYLPREDNISOLONE 4 MG PO TBPK: ORAL_TABLET | ORAL | 0 refills | Status: DC

## 2018-04-07 NOTE — ED Provider Notes (Signed)
[facility]    CSN: [id number] Arrival date & time: 04/07/18  1212     History   Chief Complaint Chief Complaint  Patient presents with  . URI    HPI Ashley Contreras is a 58 y.o. female.   HPI  14-year-old female presents with a URI symptoms that started about 11 days ago.  She states that this started in her head and now has progressively moved to her chest.  She has a productive cough that she has used over-the-counter medications but is not working.  She did a tele-med call and was prescribed Tessalon Perles but this has not helped.  She has no fever or chills.  She states that she has a terrible ear fullness and loss of voice.  Evidently she had seen an ear nose throat physician in the past who stated that she occasionally will need prednisone to help with eustachian tube dysfunction as she is prone to.  She has a sore throat from cough.          Past Medical History:  Diagnosis Date  . Abnormal Pap smear 1989   s/p colposcopy  . Allergy    hay fever  . Arthritis   . Asthma   . Chicken pox   . Colon polyp   . Complication of anesthesia   . Family history of adverse reaction to anesthesia    sister - slow to wake  . GERD (gastroesophageal reflux disease)   . Mitral valve prolapse   . PONV (postoperative nausea and vomiting)   . Shingles    waist, right side  . UTI (lower urinary tract infection)   . Wears contact lenses     Patient Active Problem List   Diagnosis Date Noted  . Special screening for malignant neoplasms, colon   . Fatty liver 04/29/2016  . Abdominal pain, chronic, right lower quadrant 01/29/2016  . Palpitations 01/29/2016  . Left breast mass 04/24/2015  . Subclinical hyperthyroidism 04/24/2015  . Encounter for general adult medical examination with abnormal findings 11/02/2012  . Personal history of colonic polyps 10/29/2012  . GERD (gastroesophageal reflux disease) 09/21/2012  . Chronic gastritis 09/21/2012  . Dyspareunia  04/25/2012  . Cervical radiculopathy 11/03/2011  . Seasonal allergies 10/21/2011    Past Surgical History:  Procedure Laterality Date  . ABDOMINAL HYSTERECTOMY  2008  . BREAST BIOPSY Left    benign  . calcaneous osteotomy  06/2011  . COLONOSCOPY WITH PROPOFOL N/A 02/17/2017   Procedure: COLONOSCOPY WITH PROPOFOL;  Surgeon: Lucilla Lame, MD;  Location: [facility];  Service: Endoscopy;  Laterality: N/A;  . ESOPHAGOGASTRODUODENOSCOPY (EGD) WITH PROPOFOL N/A 02/17/2017   Procedure: ESOPHAGOGASTRODUODENOSCOPY (EGD) WITH PROPOFOL;  Surgeon: Lucilla Lame, MD;  Location: [facility];  Service: Endoscopy;  Laterality: N/A;  . foot and ankle surgery    . OOPHORECTOMY    . TONSILLECTOMY AND ADENOIDECTOMY  2008    OB History   No obstetric history on file.      Home Medications    Prior to Admission medications   Medication Sig Start Date End Date Taking? Authorizing Provider  benzonatate (TESSALON) 100 MG capsule Take 1 capsule (100 mg total) by mouth 3 (three) times daily as needed for cough. 04/05/18  Yes Brunetta Jeans, PA-C  estradiol (VIVELLE-DOT) 0.075 MG/24HR Place 1 patch onto the skin 2 (two) times a week.   Yes [provider]  fluticasone (FLONASE) 50 MCG/ACT nasal spray Place into the nose.   Yes  [provider]  pantoprazole (PROTONIX) 40 MG tablet Take 1 tablet (40 mg total) by mouth 2 (two) times daily. 03/17/17  Yes Leone Haven, MD  acetaminophen (TYLENOL) 500 MG tablet Take 500 mg by mouth every 4 (four) hours as needed.    [provider]  calcium-vitamin D (OSCAL WITH D) 500-200 MG-UNIT TABS tablet Take by mouth.    [provider]  chlorpheniramine-HYDROcodone (TUSSIONEX PENNKINETIC ER) 10-8 MG/5ML SUER Take 5 mLs by mouth 2 (two) times daily. 04/07/18   Lorin Picket, PA-C  doxycycline (VIBRAMYCIN) 100 MG capsule Take 1 capsule (100 mg total) by mouth 2 (two) times daily. 04/07/18   Lorin Picket, PA-C    loratadine (CLARITIN) 10 MG tablet Take by mouth.    [provider]  methylPREDNISolone (MEDROL DOSEPAK) 4 MG TBPK tablet Take per package instructions 04/07/18   Lorin Picket, PA-C  valACYclovir (VALTREX) 1000 MG tablet Take 2 tablets at the onset of a fever blister and then take 2 tablets 12 hours later. As needed 02/20/17   Leone Haven, MD    Family History Family History  Problem Relation Age of Onset  . Arthritis Mother   . Cancer Mother 86       breast  . Hypertension Mother   . Mental illness Mother   . Diabetes Mother   . Breast cancer Mother 7  . Hyperlipidemia Father   . Heart disease Father   . Cancer Father        lung  . Mental illness Sister   . Myasthenia gravis Sister   . Multiple sclerosis Sister   . Multiple sclerosis Other   . Stroke Maternal Aunt   . Cancer Maternal Aunt        breast  . Breast cancer Maternal Aunt   . Stroke Maternal Grandmother   . Hypertension Maternal Grandmother   . Diabetes Maternal Grandmother     Social History Social History   Tobacco Use  . Smoking status: Never Smoker  . Smokeless tobacco: Never Used  Substance Use Topics  . Alcohol use: No  . Drug use: No     Allergies   Penicillins and Cinnamon   Review of Systems Review of Systems  Constitutional: Positive for activity change. Negative for appetite change, chills, fatigue and fever.  HENT: Positive for congestion, ear pain, postnasal drip, sinus pain, sore throat and voice change.   Respiratory: Positive for cough. Negative for shortness of breath.   All other systems reviewed and are negative.    Physical Exam Triage Vital Signs ED Triage Vitals  Enc Vitals Group     BP 04/07/18 1325 128/87     Pulse Rate 04/07/18 1325 78     Resp 04/07/18 1325 18     Temp 04/07/18 1325 98.1 F (36.7 C)     Temp Source 04/07/18 1325 Oral     SpO2 04/07/18 1325 98 %     Weight 04/07/18 1326 145 lb (65.8 kg)     Height 04/07/18 1326 5\' 6"   (1.676 m)     Head Circumference --      Peak Flow --      Pain Score 04/07/18 1326 3     Pain Loc --      Pain Edu? --      Excl. in [place]? --    No data found.  Updated Vital Signs BP 128/87 (BP Location: Left Arm)   Pulse 78   Temp  98.1 F (36.7 C) (Oral)   Resp 18   Ht 5\' 6"  (1.676 m)   Wt 145 lb (65.8 kg)   SpO2 98%   BMI 23.40 kg/m   Visual Acuity Right Eye Distance:   Left Eye Distance:   Bilateral Distance:    Right Eye Near:   Left Eye Near:    Bilateral Near:     Physical Exam Vitals signs and nursing note reviewed.  Constitutional:      General: She is not in acute distress.    Appearance: Normal appearance. She is normal weight. She is not ill-appearing, toxic-appearing or diaphoretic.  HENT:     Head: Normocephalic and atraumatic.     Right Ear: Tympanic membrane, ear canal and external ear normal.     Ears:     Comments: Left TM has sclerotic posterior inferior aspect with effusion present.    Nose: Congestion and rhinorrhea present.     Mouth/Throat:     Mouth: Mucous membranes are moist.     Pharynx: No oropharyngeal exudate or posterior oropharyngeal erythema.  Eyes:     General:        Right eye: No discharge.        Left eye: No discharge.     Conjunctiva/sclera: Conjunctivae normal.  Neck:     Musculoskeletal: Normal range of motion and neck supple.  Pulmonary:     Effort: Pulmonary effort is normal.     Breath sounds: Normal breath sounds.  Musculoskeletal: Normal range of motion.  Lymphadenopathy:     Cervical: No cervical adenopathy.  Skin:    General: Skin is warm and dry.  Neurological:     General: No focal deficit present.     Mental Status: She is alert and oriented to person, place, and time.  Psychiatric:        Mood and Affect: Mood normal.        Behavior: Behavior normal.        Thought Content: Thought content normal.        Judgment: Judgment normal.      UC Treatments / Results  Labs (all labs ordered are  listed, but only abnormal results are displayed) Labs Reviewed - No data to display  EKG None  Radiology No results found.  Procedures Procedures (including critical care time)  Medications Ordered in UC Medications - No data to display  Initial Impression / Assessment and Plan / UC Course  I have reviewed the triage vital signs and the nursing notes.  Pertinent labs & imaging results that were available during my care of the patient were reviewed by me and considered in my medical decision making (see chart for details).   58 year old female presents with URI that has persisted for over 11 days.  Has been recalcitrant to over-the-counter medications.  He has an element of eustachian tube dysfunction from the upper respiratory infection.  I have decided to prescribe a Medrol Dosepak help with the eustachian tube dysfunction since that has been necessary in the past according to her ENT.  Continue using Flonase nasal spray. Cough syrup Tussionex nighttime cough.  Also prescribe doxycycline for any atypical reasons that she may be continuing to have her upper respiratory infection.  She is not improving she should follow-up with her primary care physician   Final Clinical Impressions(s) / UC Diagnoses   Final diagnoses:  Upper respiratory tract infection, unspecified type   Discharge Instructions   None    ED Prescriptions  Medication Sig Dispense Auth. Provider   methylPREDNISolone (MEDROL DOSEPAK) 4 MG TBPK tablet Take per package instructions 21 tablet Lorin Picket, PA-C   chlorpheniramine-HYDROcodone (TUSSIONEX PENNKINETIC ER) 10-8 MG/5ML SUER Take 5 mLs by mouth 2 (two) times daily. 115 mL Crecencio Mc P, PA-C   doxycycline (VIBRAMYCIN) 100 MG capsule Take 1 capsule (100 mg total) by mouth 2 (two) times daily. 14 capsule Lorin Picket, PA-C     Controlled Substance Prescriptions [place] Controlled Substance Registry consulted? Not Applicable   Lorin Picket,  PA-C 04/07/18 1443

## 2018-04-07 NOTE — ED Triage Notes (Signed)
Pt here for URI that started 11 days ago, states it started in her head and has now moved to her chest. Has a productive cough she cannot get rid of. Did get pessalon perles without relief. No reports of fever and night sweats. Ear fullness and loss of voice. Does report some sore throat due to cough.

## 2018-05-21 MED FILL — ESTRADIOL PATCH 0.0375: 0.0375 | 28 days supply | Qty: 8 | Fill #0

## 2018-05-21 MED FILL — FLUTICASONE PROP 50 MCG SPR: 50 | 30 days supply | Qty: 16 | Fill #0

## 2018-06-12 MED FILL — DOTTI 0.0375 MG/24HR PTTW: 0.0375 | 28 days supply | Qty: 8 | Fill #1 | Status: TO

## 2018-06-14 MED FILL — FLUTICASONE PROP 50 MCG SPR: 50 | 30 days supply | Qty: 16 | Fill #1

## 2018-06-19 ENCOUNTER — Encounter: Payer: Self-pay | Admitting: Family Medicine

## 2018-06-28 ENCOUNTER — Other Ambulatory Visit: Payer: Self-pay

## 2018-06-28 ENCOUNTER — Ambulatory Visit (INDEPENDENT_AMBULATORY_CARE_PROVIDER_SITE_OTHER): Payer: Self-pay | Admitting: Family Medicine

## 2018-06-28 ENCOUNTER — Encounter: Payer: Self-pay | Admitting: Family Medicine

## 2018-06-28 VITALS — BP 120/62 | HR 76 | Temp 97.9°F | Resp 16 | Ht 66.0 in | Wt 145.0 lb

## 2018-06-28 DIAGNOSIS — Z13220 Encounter for screening for lipoid disorders: Secondary | ICD-10-CM

## 2018-06-28 DIAGNOSIS — R358 Other polyuria: Secondary | ICD-10-CM

## 2018-06-28 DIAGNOSIS — Z0000 Encounter for general adult medical examination without abnormal findings: Secondary | ICD-10-CM

## 2018-06-28 DIAGNOSIS — K2950 Unspecified chronic gastritis without bleeding: Secondary | ICD-10-CM

## 2018-06-28 DIAGNOSIS — Z85820 Personal history of malignant melanoma of skin: Secondary | ICD-10-CM

## 2018-06-28 DIAGNOSIS — K760 Fatty (change of) liver, not elsewhere classified: Secondary | ICD-10-CM

## 2018-06-28 DIAGNOSIS — R3589 Other polyuria: Secondary | ICD-10-CM

## 2018-06-28 DIAGNOSIS — E0590 Thyrotoxicosis, unspecified without thyrotoxic crisis or storm: Secondary | ICD-10-CM

## 2018-06-28 DIAGNOSIS — H9193 Unspecified hearing loss, bilateral: Secondary | ICD-10-CM

## 2018-06-28 LAB — POCT URINALYSIS DIPSTICK
Bilirubin, UA: NEGATIVE
Blood, UA: NEGATIVE
Glucose, Ur: NEGATIVE
Ketones, UA: NEGATIVE
Leukocytes, UA: NEGATIVE
Nitrite, UA: NEGATIVE
Protein, UA: NEGATIVE
Spec Grav, UA: 1.015 (ref 1.010–1.025)
Urobilinogen, UA: 0.2 E.U./dL
pH, UA: 7.5 (ref 5.0–8.0)

## 2018-06-28 MED ORDER — ESTRADIOL 0.075 MG/24HR TD PTTW: 1.0000 | MEDICATED_PATCH | TRANSDERMAL | 11 refills | Status: DC

## 2018-06-28 MED ORDER — SUCRALFATE 1 G PO TABS: 1.0000 g | ORAL_TABLET | Freq: Three times a day (TID) | ORAL | 11 refills | Status: DC

## 2018-06-28 MED ORDER — VALACYCLOVIR HCL 1 G PO TABS: ORAL_TABLET | ORAL | 1 refills | Status: DC

## 2018-06-28 MED ORDER — PANTOPRAZOLE SODIUM 40 MG PO TBEC
40.0000 mg | DELAYED_RELEASE_TABLET | Freq: Two times a day (BID) | ORAL | 1 refills | Status: DC
Start: 2018-06-28 — End: 2019-10-17

## 2018-06-28 NOTE — Progress Notes (Signed)
Patient: Ashley Contreras, Female    DOB: [birth year], 58 y.o.   MRN: [id number] Visit Date: 06/28/2018  Today's Provider: Wilhemena Durie, MD   Chief Complaint  Patient presents with  . Annual Exam    also to establish care  Not here in more than 10 years. Subjective:   Patient is also establishing care into the practice. She is married for more than 30 years. She works with [employer]. She has son and daughter,both have been home during coronovirus pandemic.  Annual physical exam Ashley Contreras is a 58 y.o. female who presents today for health maintenance and complete physical. She feels well. She reports exercising not regularly, but she does stay active. She reports she is sleeping well.  Colonoscopy- 02/17/2017. Normal. Repeat in 10 years.  Mammogram- 04/03/2018. Normal. Repeat in 1 year. Pap- 06/16/2017. Normal. HPV negative. Tdap- 09/22/2010.     Review of Systems  Constitutional: Negative.   HENT: Negative.   Eyes: Negative.   Respiratory: Negative.   Cardiovascular: Negative.   Gastrointestinal: Negative.   Endocrine: Negative.   Genitourinary: Negative.   Musculoskeletal: Positive for back pain.  Skin: Negative.   Allergic/Immunologic: Negative.   Neurological: Negative.   Hematological: Negative.   Psychiatric/Behavioral: Negative.     Social History      She  reports that she has never smoked. She has never used smokeless tobacco. She reports that she does not drink alcohol or use drugs.       Social History   Socioeconomic History  . Marital status: Married    Spouse name: Not on file  . Number of children: 2  . Years of education: Not on file  . Highest education level: Not on file  Occupational History  . Occupation: Nurse, mental health: [employer]  . Financial resource strain: Not on file  . Food insecurity:    Worry: Not on file    Inability: Not on file  . Transportation needs:    Medical: Not on  file    Non-medical: Not on file  Tobacco Use  . Smoking status: Never Smoker  . Smokeless tobacco: Never Used  Substance and Sexual Activity  . Alcohol use: No  . Drug use: No  . Sexual activity: Not on file  Lifestyle  . Physical activity:    Days per week: Not on file    Minutes per session: Not on file  . Stress: Not on file  Relationships  . Social connections:    Talks on phone: Not on file    Gets together: Not on file    Attends religious service: Not on file    Active member of club or organization: Not on file    Attends meetings of clubs or organizations: Not on file    Relationship status: Not on file  Other Topics Concern  . Not on file  Social History Narrative   Lives in [place], 2 children. No pets.      Work - [employer]      Diet - regular, healthy   Exercise -bike sometimes, limited by injury    Past Medical History:  Diagnosis Date  . Abnormal Pap smear 1989   s/p colposcopy  . Allergy    hay fever  . Arthritis   . Asthma   . Chicken pox   . Colon polyp   . Complication of anesthesia   . Family history  of adverse reaction to anesthesia    sister - slow to wake  . GERD (gastroesophageal reflux disease)   . Mitral valve prolapse   . PONV (postoperative nausea and vomiting)   . Shingles    waist, right side  . UTI (lower urinary tract infection)   . Wears contact lenses      Patient Active Problem List   Diagnosis Date Noted  . Special screening for malignant neoplasms, colon   . Fatty liver 04/29/2016  . Abdominal pain, chronic, right lower quadrant 01/29/2016  . Palpitations 01/29/2016  . Left breast mass 04/24/2015  . Subclinical hyperthyroidism 04/24/2015  . Encounter for general adult medical examination with abnormal findings 11/02/2012  . Personal history of colonic polyps 10/29/2012  . GERD (gastroesophageal reflux disease) 09/21/2012  . Chronic gastritis 09/21/2012  . Dyspareunia 04/25/2012  . Cervical radiculopathy  11/03/2011  . Seasonal allergies 10/21/2011    Past Surgical History:  Procedure Laterality Date  . ABDOMINAL HYSTERECTOMY  2008  . BREAST BIOPSY Left    benign  . calcaneous osteotomy  06/2011  . COLONOSCOPY WITH PROPOFOL N/A 02/17/2017   Procedure: COLONOSCOPY WITH PROPOFOL;  Surgeon: Lucilla Lame, MD;  Location: [facility];  Service: Endoscopy;  Laterality: N/A;  . ESOPHAGOGASTRODUODENOSCOPY (EGD) WITH PROPOFOL N/A 02/17/2017   Procedure: ESOPHAGOGASTRODUODENOSCOPY (EGD) WITH PROPOFOL;  Surgeon: Lucilla Lame, MD;  Location: [facility];  Service: Endoscopy;  Laterality: N/A;  . foot and ankle surgery    . OOPHORECTOMY    . TONSILLECTOMY AND ADENOIDECTOMY  2008    Family History        Family Status  Relation Name Status  . Mother  Deceased  . Father  Deceased  . Sister  Alive  . Sister  Alive  . Brother  Alive  . Sister  Alive  . Brother  Alive  . Other [employer]  . Mat Aunt  (Not Specified)  . MGM  (Not Specified)        Her family history includes Arthritis in her mother; Breast cancer in her maternal aunt; Breast cancer (age of onset: 37) in her mother; Cancer in her father and maternal aunt; Cancer (age of onset: 2) in her mother; Diabetes in her maternal grandmother and mother; Heart disease in her father; Hyperlipidemia in her father; Hypertension in her maternal grandmother and mother; Mental illness in her mother and sister; Multiple sclerosis in her sister and another family member; Myasthenia gravis in her sister; Stroke in her maternal aunt and maternal grandmother.      Allergies  Allergen Reactions  . Penicillins Swelling  . Cinnamon     Mouth and tongue become raw     Current Outpatient Medications:  .  acetaminophen (TYLENOL) 500 MG tablet, Take 500 mg by mouth every 4 (four) hours as needed., Disp: , Rfl:  .  calcium-vitamin D (OSCAL WITH D) 500-200 MG-UNIT TABS tablet, Take by mouth., Disp: , Rfl:  .  estradiol (VIVELLE-DOT)  0.075 MG/24HR, Place 1 patch onto the skin 2 (two) times a week., Disp: , Rfl:  .  fluticasone (FLONASE) 50 MCG/ACT nasal spray, Place into the nose., Disp: , Rfl:  .  loratadine (CLARITIN) 10 MG tablet, Take by mouth., Disp: , Rfl:  .  pantoprazole (PROTONIX) 40 MG tablet, Take 1 tablet (40 mg total) by mouth 2 (two) times daily., Disp: 180 tablet, Rfl: 1 .  valACYclovir (VALTREX) 1000 MG tablet, Take 2 tablets at the onset of a fever blister  and then take 2 tablets 12 hours later. As needed, Disp: 30 tablet, Rfl: 1 .  benzonatate (TESSALON) 100 MG capsule, Take 1 capsule (100 mg total) by mouth 3 (three) times daily as needed for cough., Disp: 30 capsule, Rfl: 0 .  chlorpheniramine-HYDROcodone (TUSSIONEX PENNKINETIC ER) 10-8 MG/5ML SUER, Take 5 mLs by mouth 2 (two) times daily., Disp: 115 mL, Rfl: 0 .  doxycycline (VIBRAMYCIN) 100 MG capsule, Take 1 capsule (100 mg total) by mouth 2 (two) times daily., Disp: 14 capsule, Rfl: 0 .  methylPREDNISolone (MEDROL DOSEPAK) 4 MG TBPK tablet, Take per package instructions, Disp: 21 tablet, Rfl: 0   Patient Care Team: Contreras Haven, MD as PCP - General (Family Medicine)    Objective:    Vitals: BP 120/62 (BP Location: Left Arm, Patient Position: Sitting, Cuff Size: Normal)   Pulse 76   Temp 97.9 F (36.6 C)   Resp 16   Ht 5\' 6"  (1.676 m)   Wt 145 lb (65.8 kg)   SpO2 100%   BMI 23.40 kg/m    Vitals:   06/28/18 1033  BP: 120/62  Pulse: 76  Resp: 16  Temp: 97.9 F (36.6 C)  SpO2: 100%  Weight: 145 lb (65.8 kg)  Height: 5\' 6"  (1.676 m)     Physical Exam Constitutional:      Appearance: Normal appearance.  HENT:     Right Ear: Tympanic membrane, ear canal and external ear normal.     Left Ear: Tympanic membrane, ear canal and external ear normal.     Nose: Nose normal.     Mouth/Throat:     Mouth: Mucous membranes are dry.     Pharynx: Oropharynx is clear.  Eyes:     Extraocular Movements: Extraocular movements intact.      Conjunctiva/sclera: Conjunctivae normal.     Pupils: Pupils are equal, round, and reactive to light.  Neck:     Musculoskeletal: Neck supple.  Cardiovascular:     Rate and Rhythm: Normal rate and regular rhythm.  Pulmonary:     Effort: Pulmonary effort is normal.     Breath sounds: Normal breath sounds.  Chest:     Breasts: Breasts are symmetrical.        Right: Normal. No swelling, bleeding, inverted nipple, mass, nipple discharge, skin change or tenderness.        Left: Normal. No swelling, bleeding, inverted nipple, mass, nipple discharge, skin change or tenderness.  Abdominal:     Palpations: Abdomen is soft.  Skin:    General: Skin is warm.  Neurological:     General: No focal deficit present.     Mental Status: She is alert and oriented to person, place, and time.  Psychiatric:        Mood and Affect: Mood normal.        Behavior: Behavior normal.        Thought Content: Thought content normal.        Judgment: Judgment normal.      Depression Screen PHQ 2/9 Scores 06/28/2018 06/16/2017 12/19/2014 09/21/2012  PHQ - 2 Score 0 0 0 0       Assessment & Plan:     Routine Health Maintenance and Physical Exam  Exercise Activities and Dietary recommendations Goals   None     Immunization History  Administered Date(s) Administered  . Hep A / Hep B 04/29/2016, 06/02/2016, 11/09/2016  . Influenza Whole 11/22/2014, 11/26/2015  . Influenza-Unspecified 10/23/2011, 11/26/2014, 11/10/2016  . Tdap 09/22/2010  Health Maintenance  Topic Date Due  . HIV Screening  12/03/1975  . INFLUENZA VACCINE  09/22/2018  . MAMMOGRAM  04/03/2020  . PAP SMEAR-Modifier  06/16/2020  . TETANUS/TDAP  09/21/2020  . COLONOSCOPY  02/18/2027  . Hepatitis C Screening  Completed     Discussed health benefits of physical activity, and encouraged her to engage in regular exercise appropriate for her age and condition. Negative Pap 1 year ago--repeat 2022.Colonoscopy 02/17/17. 1. Annual  physical exam   2. Subclinical hyperthyroidism  - Thyroid Profile  3. Polyuria  - POCT urinalysis dipstick  4. Fatty liver  - Comprehensive metabolic panel  5. Lipid screening  - Lipid panel  6. Chronic gastritis without bleeding, unspecified gastritis type  - sucralfate (CARAFATE) 1 g tablet; Take 1 tablet (1 g total) by mouth 4 (four) times daily -  with meals and at bedtime.  Dispense: 120 tablet; Refill: 11 - pantoprazole (PROTONIX) 40 MG tablet; Take 1 tablet (40 mg total) by mouth 2 (two) times daily.  Dispense: 180 tablet; Refill: 1 - CBC with Differential/Platelet 7.Bilateral hearing loss Pt now wears hearing aides 8.h/o Melanoma  15 years ago--followed by Dr Evorn Gong.    --------------------------------------------------------------------    Wilhemena Durie, MD  [facility]

## 2018-07-03 ENCOUNTER — Encounter: Payer: Self-pay | Admitting: Family Medicine

## 2018-07-13 ENCOUNTER — Telehealth: Payer: Self-pay | Admitting: Family Medicine

## 2018-07-13 DIAGNOSIS — Z13220 Encounter for screening for lipoid disorders: Secondary | ICD-10-CM | POA: Diagnosis not present

## 2018-07-13 DIAGNOSIS — K760 Fatty (change of) liver, not elsewhere classified: Secondary | ICD-10-CM | POA: Diagnosis not present

## 2018-07-13 DIAGNOSIS — E0590 Thyrotoxicosis, unspecified without thyrotoxic crisis or storm: Secondary | ICD-10-CM | POA: Diagnosis not present

## 2018-07-13 DIAGNOSIS — K2950 Unspecified chronic gastritis without bleeding: Secondary | ICD-10-CM | POA: Diagnosis not present

## 2018-07-13 MED ORDER — ESTRADIOL 0.0375 MG/24HR TD PTTW: 1.0000 | MEDICATED_PATCH | TRANSDERMAL | 12 refills | Status: DC

## 2018-07-13 NOTE — Telephone Encounter (Signed)
I called and spoke with patient. She states she saw her GYN last June 2019 and they decreased her dose from 0.05mg  to 0.0375mg . Patient would like this dose of 0.0375mg  sent into the pharmacy.

## 2018-07-13 NOTE — Telephone Encounter (Signed)
Patient came by office stating Dr, Rosanna Randy sent in wrong strength of Estradiol patch.  She is on the 0.375 mg. And he sent it 0.075 mg.  Will you please send corrected strength in.  She is totally out and has been waiting since May 13th for this to be corrected.    Send to [facility].

## 2018-07-13 NOTE — Telephone Encounter (Signed)
i'm confused. It looks like she had been taking 0.05mg  for the last few years. There is no such thing os 0.375, but there is a 0.0375, which is a lower dose than what she had been taking. Is this what she wants? I don't see any mention of it in her record.

## 2018-07-14 LAB — COMPREHENSIVE METABOLIC PANEL WITH GFR
ALT: 13 IU/L (ref 0–32)
AST: 14 IU/L (ref 0–40)
Albumin/Globulin Ratio: 2.1 (ref 1.2–2.2)
Albumin: 4.2 g/dL (ref 3.8–4.9)
Alkaline Phosphatase: 72 IU/L (ref 39–117)
BUN/Creatinine Ratio: 23 (ref 9–23)
BUN: 16 mg/dL (ref 6–24)
Bilirubin Total: 0.3 mg/dL (ref 0.0–1.2)
CO2: 23 mmol/L (ref 20–29)
Calcium: 9 mg/dL (ref 8.7–10.2)
Chloride: 100 mmol/L (ref 96–106)
Creatinine, Ser: 0.7 mg/dL (ref 0.57–1.00)
GFR calc Af Amer: 111 mL/min/1.73
GFR calc non Af Amer: 96 mL/min/1.73
Globulin, Total: 2 g/dL (ref 1.5–4.5)
Glucose: 91 mg/dL (ref 65–99)
Potassium: 4.7 mmol/L (ref 3.5–5.2)
Sodium: 139 mmol/L (ref 134–144)
Total Protein: 6.2 g/dL (ref 6.0–8.5)

## 2018-07-14 LAB — CBC WITH DIFFERENTIAL/PLATELET
Basophils Absolute: 0 10*3/uL (ref 0.0–0.2)
Basos: 1 %
EOS (ABSOLUTE): 0.2 10*3/uL (ref 0.0–0.4)
Eos: 5 %
Hematocrit: 43.2 % (ref 34.0–46.6)
Hemoglobin: 14.3 g/dL (ref 11.1–15.9)
Immature Grans (Abs): 0 10*3/uL (ref 0.0–0.1)
Immature Granulocytes: 0 %
Lymphocytes Absolute: 1.4 10*3/uL (ref 0.7–3.1)
Lymphs: 26 %
MCH: 31.8 pg (ref 26.6–33.0)
MCHC: 33.1 g/dL (ref 31.5–35.7)
MCV: 96 fL (ref 79–97)
Monocytes Absolute: 0.5 10*3/uL (ref 0.1–0.9)
Monocytes: 9 %
Neutrophils Absolute: 3.2 10*3/uL (ref 1.4–7.0)
Neutrophils: 59 %
Platelets: 218 10*3/uL (ref 150–450)
RBC: 4.49 x10E6/uL (ref 3.77–5.28)
RDW: 12.8 % (ref 11.7–15.4)
WBC: 5.3 10*3/uL (ref 3.4–10.8)

## 2018-07-14 LAB — LIPID PANEL
Chol/HDL Ratio: 3.3 ratio (ref 0.0–4.4)
Cholesterol, Total: 195 mg/dL (ref 100–199)
HDL: 59 mg/dL (ref >39)
LDL Calculated: 124 mg/dL — ABNORMAL HIGH (ref 0–99)
Triglycerides: 61 mg/dL (ref 0–149)
VLDL Cholesterol Cal: 12 mg/dL (ref 5–40)

## 2018-07-14 LAB — THYROID PANEL
Free Thyroxine Index: 1.7 (ref 1.2–4.9)
T3 Uptake Ratio: 26 % (ref 24–39)
T4, Total: 6.5 ug/dL (ref 4.5–12.0)

## 2018-07-18 ENCOUNTER — Telehealth: Payer: Self-pay

## 2018-07-18 NOTE — Telephone Encounter (Signed)
-----   Message from Jerrol Banana., MD sent at 07/14/2018  1:55 PM EDT ----- Stable.

## 2018-07-18 NOTE — Telephone Encounter (Signed)
LMTCB 07/18/2018  Thanks,   -Anjolie Majer  

## 2018-07-23 NOTE — Telephone Encounter (Signed)
Patient reviewed labs via mychart.

## 2018-07-27 NOTE — Telephone Encounter (Signed)
This was corrected

## 2018-07-31 ENCOUNTER — Encounter: Payer: Self-pay | Admitting: Physician Assistant

## 2018-08-15 ENCOUNTER — Ambulatory Visit: Payer: 59 | Admitting: Obstetrics and Gynecology

## 2018-09-03 ENCOUNTER — Other Ambulatory Visit: Payer: Self-pay | Admitting: Family Medicine

## 2018-09-03 MED ORDER — FLUTICASONE PROPIONATE 50 MCG/ACT NA SUSP: 2.0000 | Freq: Every day | NASAL | 12 refills | Status: DC

## 2018-09-03 MED FILL — FLUTICASONE PROP 50 MCG SPR: 50 | 30 days supply | Qty: 16 | Fill #0

## 2018-09-03 NOTE — Telephone Encounter (Signed)
Medication sent into the pharmacy. 

## 2018-09-03 NOTE — Telephone Encounter (Signed)
[facility] faxed refill request for the following medications:  fluticasone (FLONASE) 50 MCG/ACT nasal spray    Please advise.

## 2018-09-11 IMAGING — MG MM DIGITAL SCREENING BILAT W/ TOMO W/ CAD
9 of 13 series · 9 of 29 positions shown · non-contrast
Comparison: Previous exam(s).

CLINICAL DATA: Screening.

EXAM:
2D DIGITAL SCREENING BILATERAL MAMMOGRAM WITH CAD AND ADJUNCT TOMO

[L MLO (1 of 2)]
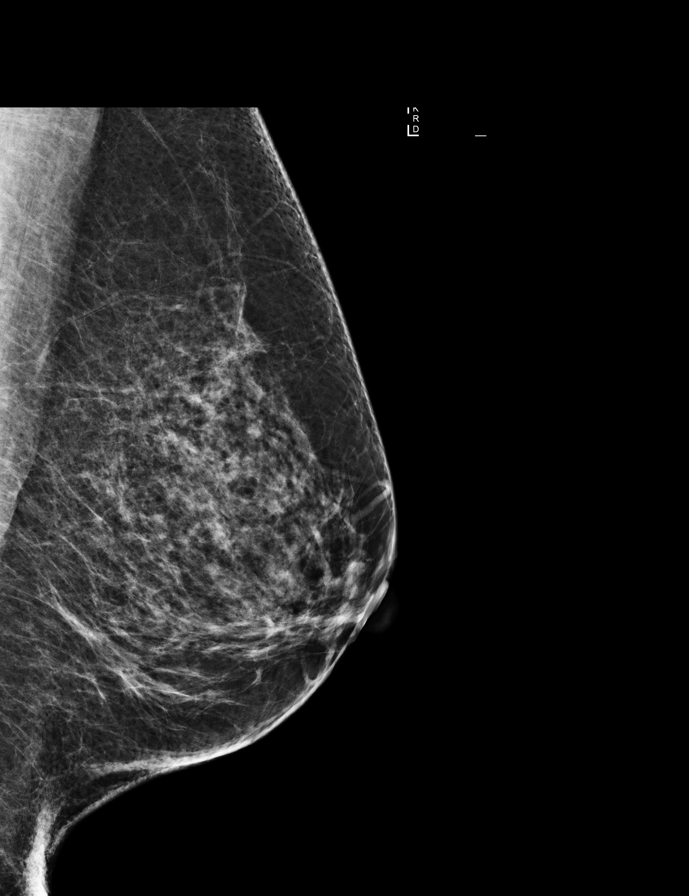

[R CC]
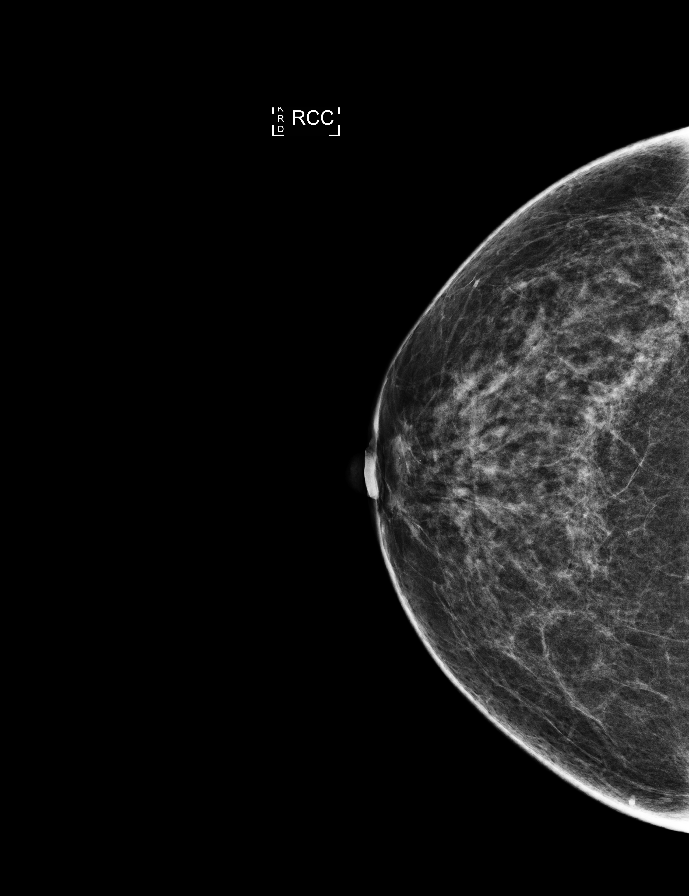

[R MLO synth-2D]
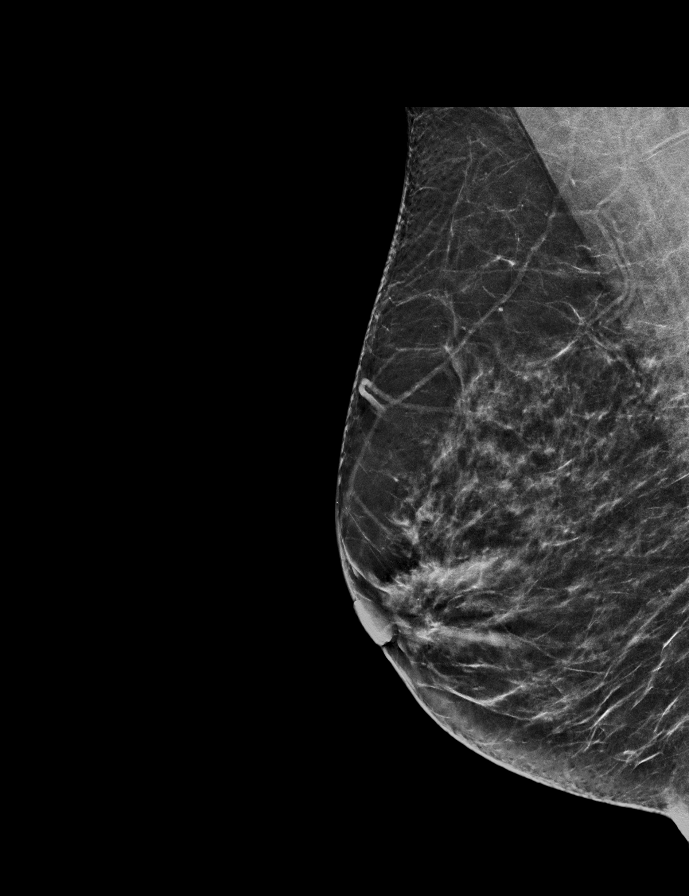

[L CC synth-2D]
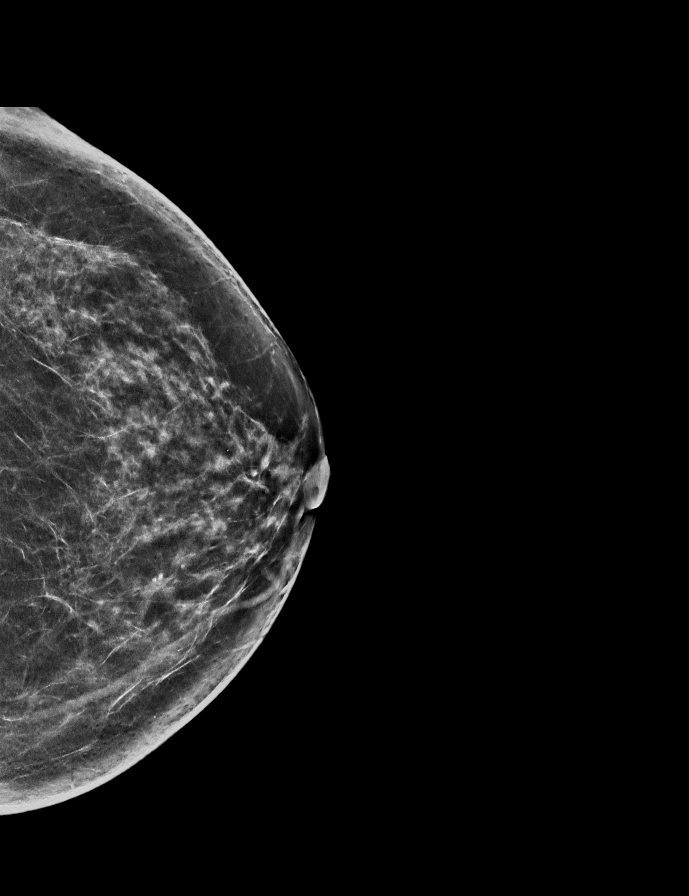

[R CC synth-2D]
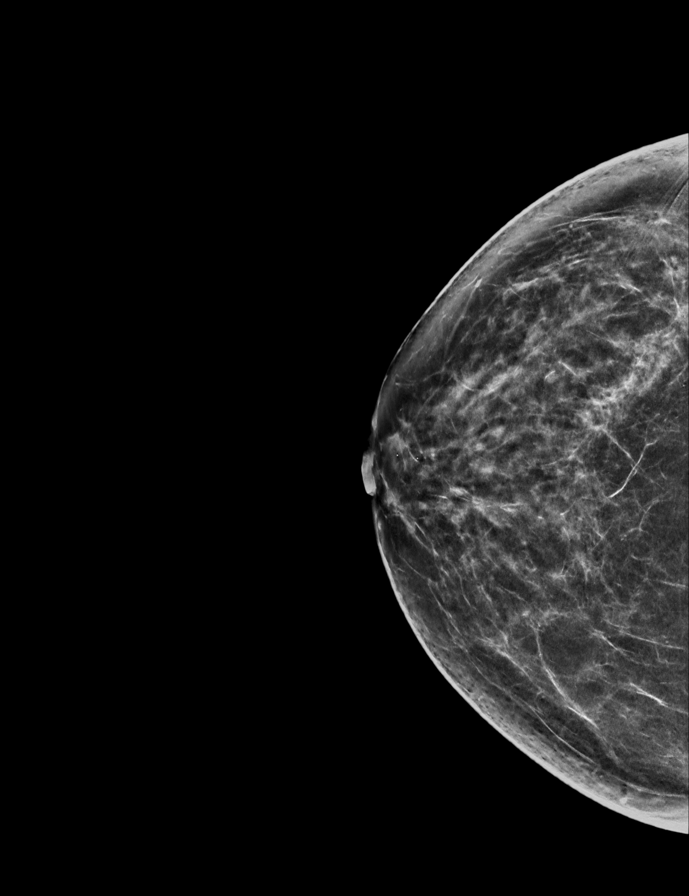

[L MLO synth-2D]
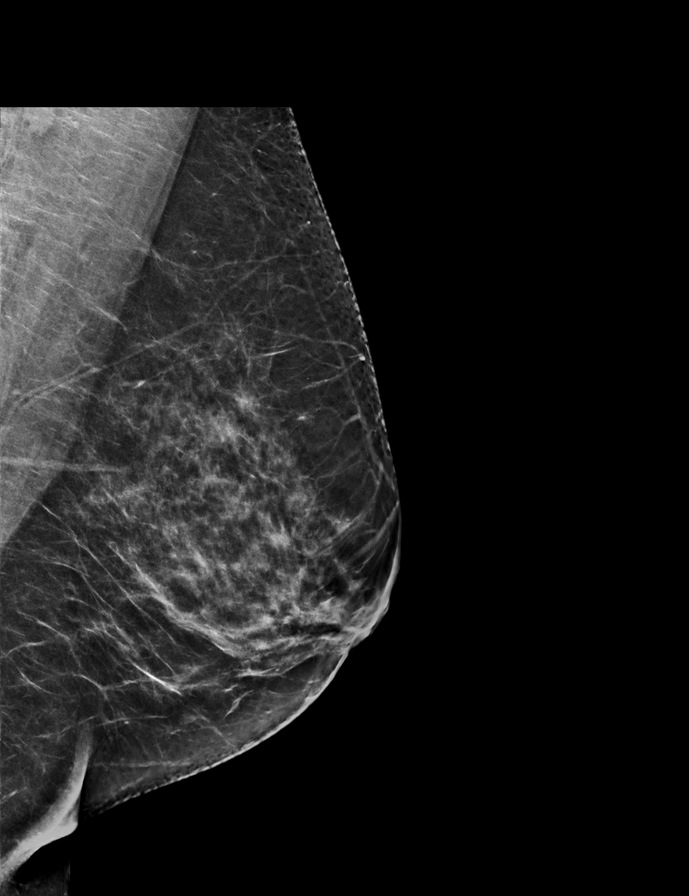

[L MLO (2 of 2)]
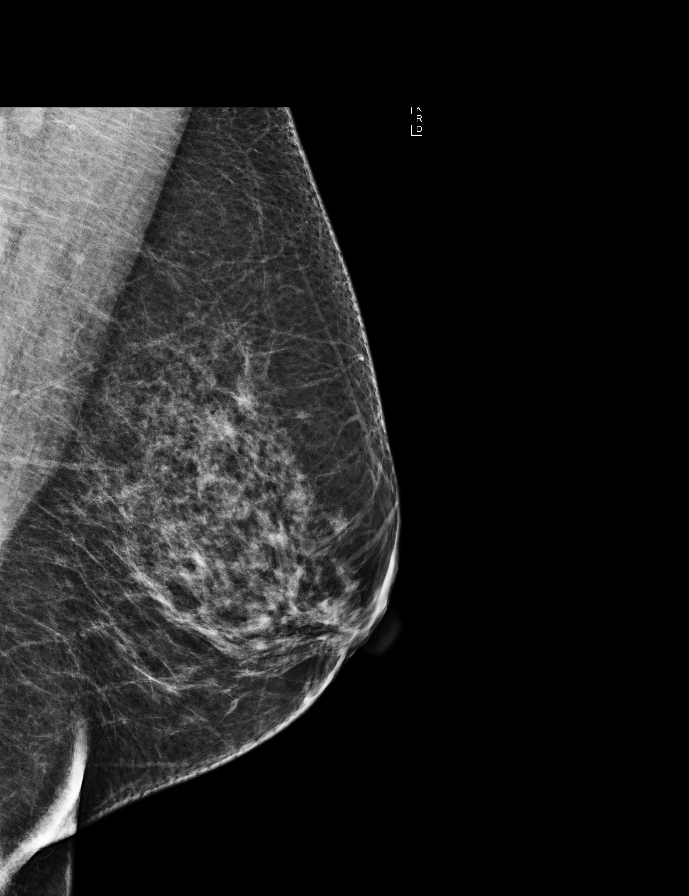

[L CC]
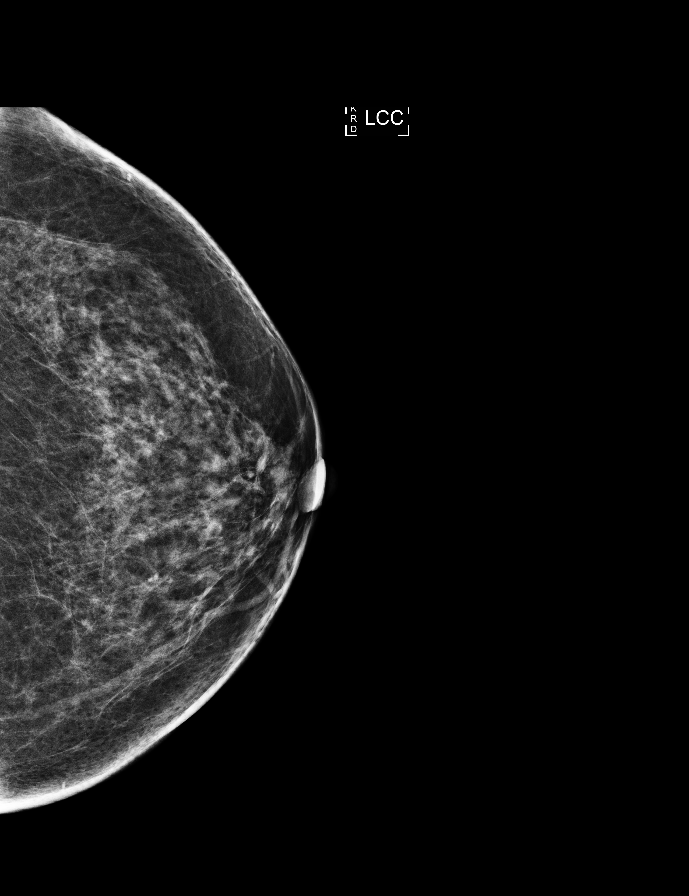

[R MLO]
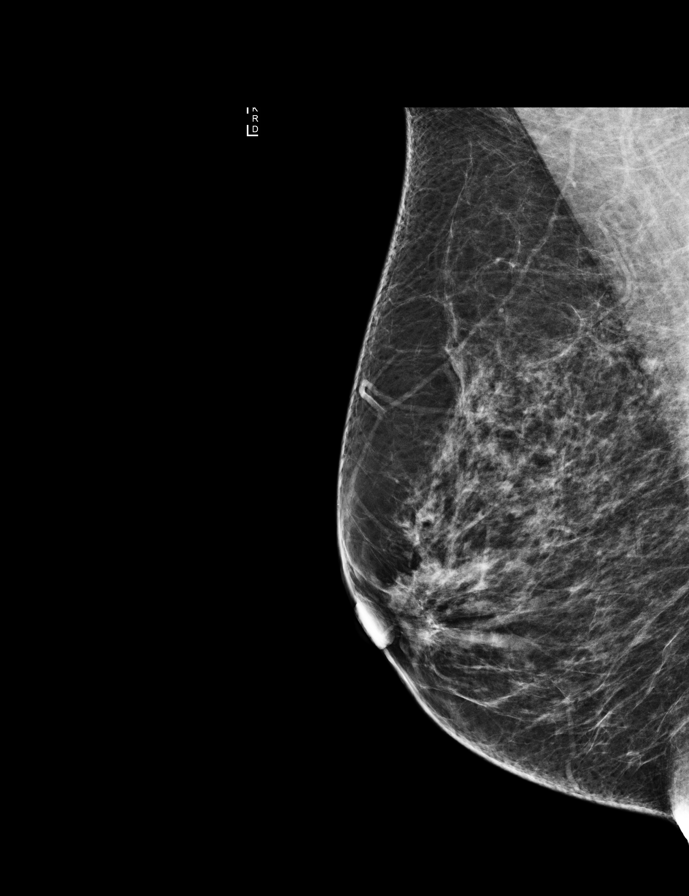

[9 of 29 positions shown; findings below may reference images not displayed]

ACR Breast Density Category c: The breast tissue is heterogeneously
dense, which may obscure small masses.
FINDINGS: There are no findings suspicious for malignancy. Images were
processed with CAD.
IMPRESSION: No mammographic evidence of malignancy. A result letter of this
screening mammogram will be mailed directly to the patient.

RECOMMENDATION:
Screening mammogram in one year. (Code:TN-[id number])

BI-RADS CATEGORY  1: Negative.

## 2018-09-21 DIAGNOSIS — D2272 Melanocytic nevi of left lower limb, including hip: Secondary | ICD-10-CM | POA: Diagnosis not present

## 2018-09-21 DIAGNOSIS — D2262 Melanocytic nevi of left upper limb, including shoulder: Secondary | ICD-10-CM | POA: Diagnosis not present

## 2018-09-21 DIAGNOSIS — L538 Other specified erythematous conditions: Secondary | ICD-10-CM | POA: Diagnosis not present

## 2018-09-21 DIAGNOSIS — R208 Other disturbances of skin sensation: Secondary | ICD-10-CM | POA: Diagnosis not present

## 2018-09-21 DIAGNOSIS — L821 Other seborrheic keratosis: Secondary | ICD-10-CM | POA: Diagnosis not present

## 2018-09-21 DIAGNOSIS — L820 Inflamed seborrheic keratosis: Secondary | ICD-10-CM | POA: Diagnosis not present

## 2018-09-21 DIAGNOSIS — D2261 Melanocytic nevi of right upper limb, including shoulder: Secondary | ICD-10-CM | POA: Diagnosis not present

## 2018-09-21 DIAGNOSIS — Z872 Personal history of diseases of the skin and subcutaneous tissue: Secondary | ICD-10-CM | POA: Diagnosis not present

## 2018-09-21 DIAGNOSIS — Z09 Encounter for follow-up examination after completed treatment for conditions other than malignant neoplasm: Secondary | ICD-10-CM | POA: Diagnosis not present

## 2018-09-21 DIAGNOSIS — D2271 Melanocytic nevi of right lower limb, including hip: Secondary | ICD-10-CM | POA: Diagnosis not present

## 2018-09-21 DIAGNOSIS — D225 Melanocytic nevi of trunk: Secondary | ICD-10-CM | POA: Diagnosis not present

## 2018-09-21 DIAGNOSIS — L728 Other follicular cysts of the skin and subcutaneous tissue: Secondary | ICD-10-CM | POA: Diagnosis not present

## 2018-10-02 DIAGNOSIS — H8109 Meniere's disease, unspecified ear: Secondary | ICD-10-CM | POA: Diagnosis not present

## 2018-10-02 DIAGNOSIS — H903 Sensorineural hearing loss, bilateral: Secondary | ICD-10-CM | POA: Diagnosis not present

## 2018-10-02 DIAGNOSIS — H6123 Impacted cerumen, bilateral: Secondary | ICD-10-CM | POA: Diagnosis not present

## 2018-10-23 DIAGNOSIS — Z9189 Other specified personal risk factors, not elsewhere classified: Secondary | ICD-10-CM

## 2018-10-23 DIAGNOSIS — Z1371 Encounter for nonprocreative screening for genetic disease carrier status: Secondary | ICD-10-CM

## 2018-10-23 HISTORY — DX: Encounter for nonprocreative screening for genetic disease carrier status: Z13.71

## 2018-10-23 HISTORY — DX: Other specified personal risk factors, not elsewhere classified: Z91.89

## 2018-11-05 NOTE — Progress Notes (Signed)
PCP: Jerrol Banana., MD   Chief Complaint  Patient presents with  . Gynecologic Exam    HPI:      [name] is a 58 y.o. No obstetric history on file. who LMP was No LMP recorded. Patient has had a hysterectomy., presents today for her NP>3 yrs annual examination.  Her menses are absent due to supracervical hyst BSO for menorrhagia/endometriosis 2008. No PMB. She does not have vasomotor sx on vivelle-dot 0.0375 mg. Doing well and wants to continue. Rxd by PCP but pt wants to make sure it's still ok to use it.  Sex activity: single partner, contraception - status post hysterectomy. She does not have vaginal dryness.  Last Pap: 12/17/14  Results were: no abnormalities /neg HPV DNA.  Hx of STDs: none  Last mammogram: April 03, 2018  Results were: normal--routine follow-up in 12 months There is a FH of breast cancer in her mom and 2 mat aunts, genetic testing not done. There is no FH of ovarian cancer. The patient does do self-breast exams.  Colonoscopy: 12/18 without abnormalities;  Repeat due after 5 years due to hx of polyps in past DEXA: normal D[facility] in spine [facility] hip at [facility] 2016.   Tobacco use: The patient denies current or previous tobacco use. Alcohol use: none Exercise: moderately active  She does get adequate calcium and Vitamin D in her diet.  Labs with PCP.   Past Medical History:  Diagnosis Date  . Abnormal Pap smear 1989   s/p colposcopy  . Allergy    hay fever  . Arthritis   . Asthma   . Chicken pox   . Colon polyp   . Complication of anesthesia   . Family history of adverse reaction to anesthesia    sister - slow to wake  . Fatty liver    seen by hepatology  . GERD (gastroesophageal reflux disease)   . Mitral valve prolapse   . PONV (postoperative nausea and vomiting)   . Shingles    waist, right side  . UTI (lower urinary tract infection)   . Wears contact lenses     Past Surgical History:  Procedure Laterality Date  .  ABDOMINAL HYSTERECTOMY  2008  . BREAST BIOPSY Left    benign  . calcaneous osteotomy  06/2011  . COLONOSCOPY WITH PROPOFOL N/A 02/17/2017   Procedure: COLONOSCOPY WITH PROPOFOL;  Surgeon: Lucilla Lame, MD;  Location: [facility];  Service: Endoscopy;  Laterality: N/A;  . ESOPHAGOGASTRODUODENOSCOPY (EGD) WITH PROPOFOL N/A 02/17/2017   Procedure: ESOPHAGOGASTRODUODENOSCOPY (EGD) WITH PROPOFOL;  Surgeon: Lucilla Lame, MD;  Location: [facility];  Service: Endoscopy;  Laterality: N/A;  . foot and ankle surgery    . OOPHORECTOMY    . TONSILLECTOMY AND ADENOIDECTOMY  2008    Family History  Problem Relation Age of Onset  . Arthritis Mother   . Hypertension Mother   . Mental illness Mother   . Diabetes Mother   . Breast cancer Mother 67       right breast  . Hyperlipidemia Father   . Heart disease Father   . Cancer Father        lung  . Mental illness Sister   . Myasthenia gravis Sister   . Multiple sclerosis Sister   . Multiple sclerosis Other   . Stroke Maternal Aunt   . Breast cancer Maternal Aunt 70       right breast  . Stroke Maternal Grandmother   .  Hypertension Maternal Grandmother   . Diabetes Maternal Grandmother   . Breast cancer Maternal Aunt 72       right breast    Social History   Socioeconomic History  . Marital status: Married    Spouse name: Not on file  . Number of children: 2  . Years of education: Not on file  . Highest education level: Not on file  Occupational History  . 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 Financial resource strain: Not on file  . Food insecurity    Worry: Not on file    Inability: Not on file  . Transportation needs    Medical: Not on file    Non-medical: Not on file  Tobacco Use  . Smoking status: Never Smoker  . Smokeless tobacco: Never Used  Substance and Sexual Activity  . Alcohol use: No  . Drug use: No  . Sexual activity: Yes    Birth control/protection: Surgical     Comment: Hysterectomy  Lifestyle  . Physical activity    Days per week: Not on file    Minutes per session: Not on file  . Stress: Not on file  Relationships  . Social Herbalist on phone: Not on file    Gets together: Not on file    Attends religious service: Not on file    Active member of club or organization: Not on file    Attends meetings of clubs or organizations: Not on file    Relationship status: Not on file  . Intimate partner violence    Fear of current or ex partner: Not on file    Emotionally abused: Not on file    Physically abused: Not on file    Forced sexual activity: Not on file  Other Topics Concern  . Not on file  Social History Narrative   Lives in [place], 2 children. No pets.      Work - [employer]      Diet - regular, healthy   Exercise -bike sometimes, limited by injury     Current Outpatient Medications:  .  acetaminophen (TYLENOL) 500 MG tablet, Take 500 mg by mouth every 4 (four) hours as needed., Disp: , Rfl:  .  calcium-vitamin D (OSCAL WITH D) 500-200 MG-UNIT TABS tablet, Take by mouth., Disp: , Rfl:  .  estradiol (VIVELLE-DOT) 0.0375 MG/24HR, Place 1 patch onto the skin 2 (two) times a week., Disp: 8 patch, Rfl: 12 .  fluticasone (FLONASE) 50 MCG/ACT nasal spray, Place 2 sprays into both nostrils daily., Disp: 16 g, Rfl: 12 .  loratadine (CLARITIN) 10 MG tablet, Take by mouth., Disp: , Rfl:  .  pantoprazole (PROTONIX) 40 MG tablet, Take 1 tablet (40 mg total) by mouth 2 (two) times daily., Disp: 180 tablet, Rfl: 1 .  sucralfate (CARAFATE) 1 g tablet, Take 1 tablet (1 g total) by mouth 4 (four) times daily -  with meals and at bedtime., Disp: 120 tablet, Rfl: 11 .  valACYclovir (VALTREX) 1000 MG tablet, Take 2 tablets at the onset of a fever blister and then take 2 tablets 12 hours later. As needed, Disp: 30 tablet, Rfl: 1     ROS:  Review of Systems  Constitutional: Negative for fatigue, fever and unexpected weight change.   Respiratory: Negative for cough, shortness of breath and wheezing.   Cardiovascular: Negative for chest pain, palpitations and leg swelling.  Gastrointestinal: Negative for blood in stool,  constipation, diarrhea, nausea and vomiting.  Endocrine: Negative for cold intolerance, heat intolerance and polyuria.  Genitourinary: Positive for frequency. Negative for dyspareunia, dysuria, flank pain, genital sores, hematuria, menstrual problem, pelvic pain, urgency, vaginal bleeding, vaginal discharge and vaginal pain.  Musculoskeletal: Negative for back pain, joint swelling and myalgias.  Skin: Negative for rash.  Neurological: Positive for dizziness. Negative for syncope, light-headedness, numbness and headaches.  Hematological: Negative for adenopathy.  Psychiatric/Behavioral: Negative for agitation, confusion, sleep disturbance and suicidal ideas. The patient is not nervous/anxious.   BREAST: No symptoms    Objective: BP 110/70   Ht 5\' 6"  (1.676 m)   Wt 146 lb (66.2 kg)   BMI 23.57 kg/m    Physical Exam Constitutional:      Appearance: She is well-developed.  Genitourinary:     Vulva, vagina and cervix normal.     No vulval lesion or tenderness noted.     No vaginal discharge, erythema or tenderness.     No cervical polyp.     Uterus is not tender.     Uterus is absent.     Right adnexa absent.     Right adnexa not tender.     Left adnexa absent.     Left adnexa not tender.     Genitourinary Comments: UTERUS SURG ABSENT  Neck:     Musculoskeletal: Normal range of motion.     Thyroid: No thyromegaly.  Cardiovascular:     Rate and Rhythm: Normal rate and regular rhythm.     Heart sounds: Normal heart sounds. No murmur.  Pulmonary:     Effort: Pulmonary effort is normal.     Breath sounds: Normal breath sounds.  Chest:     Breasts:        Right: No mass, nipple discharge, skin change or tenderness.        Left: No mass, nipple discharge, skin change or tenderness.   Abdominal:     Palpations: Abdomen is soft.     Tenderness: There is no abdominal tenderness. There is no guarding.  Musculoskeletal: Normal range of motion.  Neurological:     General: No focal deficit present.     Mental Status: She is alert and oriented to person, place, and time.     Cranial Nerves: No cranial nerve deficit.  Skin:    General: Skin is warm and dry.  Psychiatric:        Mood and Affect: Mood normal.        Behavior: Behavior normal.        Thought Content: Thought content normal.        Judgment: Judgment normal.  Vitals signs reviewed.     Assessment/Plan:  Encounter for annual routine gynecological examination  Cervical cancer screening - Plan: Cytology - PAP  Screening for HPV (human papillomavirus) - Plan: Cytology - PAP  Screening for breast cancer - Plan: MM 3D SCREEN BREAST BILATERAL--pt current on pap  Family history of breast cancer--MyRisk tesitng discussed and done today. Will call with results.   Hormone replacement therapy (HRT)--Rx RF vivelle-dot with PCP. Discussed pros/cons/risks/benefits. Pt wants to cont.          GYN counsel breast self exam, mammography screening, adequate intake of calcium and vitamin D, diet and exercise    F/U  Return in about 1 year (around 11/06/2019).  Kare Dado B. Meiko Stranahan, PA-C 11/06/2018 11:11 AM

## 2018-11-06 ENCOUNTER — Other Ambulatory Visit (HOSPITAL_COMMUNITY)
Admission: RE | Admit: 2018-11-06 | Discharge: 2018-11-06 | Disposition: A | Discharge status: Home / Self Care | Payer: 59 | Source: Ambulatory Visit | Attending: Obstetrics and Gynecology | Admitting: Obstetrics and Gynecology

## 2018-11-06 ENCOUNTER — Encounter: Payer: Self-pay | Admitting: Obstetrics and Gynecology

## 2018-11-06 ENCOUNTER — Other Ambulatory Visit: Payer: Self-pay

## 2018-11-06 ENCOUNTER — Ambulatory Visit (INDEPENDENT_AMBULATORY_CARE_PROVIDER_SITE_OTHER): Payer: 59 | Admitting: Obstetrics and Gynecology

## 2018-11-06 VITALS — BP 110/70 | Ht 66.0 in | Wt 146.0 lb

## 2018-11-06 DIAGNOSIS — Z124 Encounter for screening for malignant neoplasm of cervix: Secondary | ICD-10-CM | POA: Diagnosis not present

## 2018-11-06 DIAGNOSIS — Z803 Family history of malignant neoplasm of breast: Secondary | ICD-10-CM | POA: Diagnosis not present

## 2018-11-06 DIAGNOSIS — Z79890 Hormone replacement therapy (postmenopausal): Secondary | ICD-10-CM

## 2018-11-06 DIAGNOSIS — Z1151 Encounter for screening for human papillomavirus (HPV): Secondary | ICD-10-CM | POA: Insufficient documentation

## 2018-11-06 DIAGNOSIS — Z01419 Encounter for gynecological examination (general) (routine) without abnormal findings: Secondary | ICD-10-CM

## 2018-11-06 DIAGNOSIS — Z1239 Encounter for other screening for malignant neoplasm of breast: Secondary | ICD-10-CM

## 2018-11-06 LAB — HM PAP SMEAR: HM Pap smear: NORMAL

## 2018-11-06 NOTE — Patient Instructions (Signed)
I value your feedback and entrusting us with your care. If you get a [facility] patient survey, I would appreciate you taking the time to let us know about your experience today. Thank you! 

## 2018-11-08 LAB — CYTOLOGY - PAP
Diagnosis: NEGATIVE
High risk HPV: NEGATIVE
Molecular Disclaimer: 56
Molecular Disclaimer: DETECTED
Molecular Disclaimer: NORMAL

## 2018-11-20 ENCOUNTER — Encounter: Payer: Self-pay | Admitting: Obstetrics and Gynecology

## 2018-11-28 ENCOUNTER — Encounter: Payer: Self-pay | Admitting: Obstetrics and Gynecology

## 2018-12-24 ENCOUNTER — Telehealth: Payer: Self-pay | Admitting: Obstetrics and Gynecology

## 2018-12-24 NOTE — Telephone Encounter (Signed)
PT called returning your phone call. Will be available to talk after 430 today. Thanks!

## 2018-12-24 NOTE — Telephone Encounter (Signed)
LMTRC

## 2018-12-25 NOTE — Telephone Encounter (Signed)
Patient returning missed call. 

## 2018-12-25 NOTE — Telephone Encounter (Signed)
Pt aware of neg MyRisk results except ATM VUS. TC=24.7%/riskscore=15.8%. Discussed monthly SBE, yearly CBE and mammos (due 2/21), and scr breast MRI. Pt has 2 different breast cancer risk model scores. Riskscore is probably more accurate but pt still qualifies for addl screening breast MRI based on TC score. Discussed pros/cons. Pt to consider and f/u prn. Needs mammo first, can then do scr breast MRI 2021.   Patient understands these results only apply to her and her children, and this is not indicative of genetic testing results of her other family members. It is recommended that her other family members have genetic testing done. If there is a breast cancer gene, then pt's cancer risk score would be much less.   Pt also understands negative genetic testing doesn't mean she will never get any of these cancers.   Hard copy mailed to pt. F/u prn.  

## 2019-02-22 DIAGNOSIS — H04129 Dry eye syndrome of unspecified lacrimal gland: Secondary | ICD-10-CM

## 2019-02-22 HISTORY — DX: Dry eye syndrome of unspecified lacrimal gland: H04.129

## 2019-03-05 DIAGNOSIS — H524 Presbyopia: Secondary | ICD-10-CM | POA: Diagnosis not present

## 2019-03-14 ENCOUNTER — Encounter (INDEPENDENT_AMBULATORY_CARE_PROVIDER_SITE_OTHER): Payer: 59 | Admitting: Ophthalmology

## 2019-03-14 DIAGNOSIS — H43813 Vitreous degeneration, bilateral: Secondary | ICD-10-CM

## 2019-03-14 DIAGNOSIS — H353132 Nonexudative age-related macular degeneration, bilateral, intermediate dry stage: Secondary | ICD-10-CM

## 2019-03-14 DIAGNOSIS — H2513 Age-related nuclear cataract, bilateral: Secondary | ICD-10-CM

## 2019-03-19 ENCOUNTER — Encounter: Payer: Self-pay | Admitting: Internal Medicine

## 2019-03-20 ENCOUNTER — Encounter: Payer: Self-pay | Admitting: Obstetrics and Gynecology

## 2019-04-02 ENCOUNTER — Encounter: Payer: Self-pay | Admitting: Family Medicine

## 2019-04-02 ENCOUNTER — Other Ambulatory Visit: Payer: Self-pay

## 2019-04-02 ENCOUNTER — Ambulatory Visit (INDEPENDENT_AMBULATORY_CARE_PROVIDER_SITE_OTHER): Payer: 59 | Admitting: Family Medicine

## 2019-04-02 VITALS — BP 99/61 | HR 73 | Temp 93.7°F | Wt 144.2 lb

## 2019-04-02 DIAGNOSIS — Z9229 Personal history of other drug therapy: Secondary | ICD-10-CM | POA: Diagnosis not present

## 2019-04-02 DIAGNOSIS — K760 Fatty (change of) liver, not elsewhere classified: Secondary | ICD-10-CM

## 2019-04-02 DIAGNOSIS — K219 Gastro-esophageal reflux disease without esophagitis: Secondary | ICD-10-CM

## 2019-04-02 DIAGNOSIS — M6289 Other specified disorders of muscle: Secondary | ICD-10-CM | POA: Diagnosis not present

## 2019-04-02 DIAGNOSIS — E559 Vitamin D deficiency, unspecified: Secondary | ICD-10-CM

## 2019-04-02 DIAGNOSIS — K589 Irritable bowel syndrome without diarrhea: Secondary | ICD-10-CM

## 2019-04-02 NOTE — Progress Notes (Signed)
Patient: Ashley Contreras Female    DOB: Sep 15, 1960   59 y.o.   MRN: [id number] Visit Date: 04/02/2019  Today's Provider: Wilhemena Durie, MD   Chief Complaint  Patient presents with  . Vitamin D check   Subjective:     HPI Patient recently diagnosed with macular degeneration, would like to know is she experiencing any deficiencies in any vitamins.  She has had no visual disturbance.  Macular degeneration was found on routine eye exam. She also had an incidental finding several years ago of fatty liver on CT scan.  She is worried as she has a niece with primary biliary cholangitis. She would like to see a GI doctor by the name of Dr. Maxwell Caul. at [facility] liver clinic. Patient is wanting to get her vitamin D level checked.  Allergies  Allergen Reactions  . Penicillins Swelling  . Cinnamon     Mouth and tongue become raw     Current Outpatient Medications:  .  acetaminophen (TYLENOL) 500 MG tablet, Take 500 mg by mouth every 4 (four) hours as needed., Disp: , Rfl:  .  calcium-vitamin D (OSCAL WITH D) 500-200 MG-UNIT TABS tablet, Take by mouth., Disp: , Rfl:  .  estradiol (VIVELLE-DOT) 0.0375 MG/24HR, Place 1 patch onto the skin 2 (two) times a week., Disp: 8 patch, Rfl: 12 .  fluticasone (FLONASE) 50 MCG/ACT nasal spray, Place 2 sprays into both nostrils daily., Disp: 16 g, Rfl: 12 .  loratadine (CLARITIN) 10 MG tablet, Take by mouth., Disp: , Rfl:  .  pantoprazole (PROTONIX) 40 MG tablet, Take 1 tablet (40 mg total) by mouth 2 (two) times daily., Disp: 180 tablet, Rfl: 1 .  sucralfate (CARAFATE) 1 g tablet, Take 1 tablet (1 g total) by mouth 4 (four) times daily -  with meals and at bedtime., Disp: 120 tablet, Rfl: 11 .  valACYclovir (VALTREX) 1000 MG tablet, Take 2 tablets at the onset of a fever blister and then take 2 tablets 12 hours later. As needed, Disp: 30 tablet, Rfl: 1  Review of Systems  Constitutional: Negative for appetite change, chills, fatigue and fever.    HENT: Negative.   Eyes: Negative.   Respiratory: Negative for chest tightness and shortness of breath.   Cardiovascular: Negative for chest pain and palpitations.  Gastrointestinal: Negative for abdominal pain, nausea and vomiting.  Endocrine: Negative.   Allergic/Immunologic: Negative.   Neurological: Negative for dizziness and weakness.  Psychiatric/Behavioral: Negative.     Social History   Tobacco Use  . Smoking status: Never Smoker  . Smokeless tobacco: Never Used  Substance Use Topics  . Alcohol use: No      Objective:   BP 99/61 (BP Location: Right Arm, Patient Position: Sitting, Cuff Size: Normal)   Pulse 73   Temp (!) 93.7 F (34.3 C) (Temporal)   Wt 144 lb 3.2 oz (65.4 kg)   SpO2 98%   BMI 23.27 kg/m  Vitals:   04/02/19 1545  BP: 99/61  Pulse: 73  Temp: (!) 93.7 F (34.3 C)  TempSrc: Temporal  SpO2: 98%  Weight: 144 lb 3.2 oz (65.4 kg)  Body mass index is 23.27 kg/m.   Physical Exam Vitals reviewed.  Constitutional:      Appearance: Normal appearance.  HENT:     Right Ear: Tympanic membrane, ear canal and external ear normal.     Left Ear: Tympanic membrane, ear canal and external ear normal.     Nose:  Nose normal.     Mouth/Throat:     Mouth: Mucous membranes are dry.     Pharynx: Oropharynx is clear.  Eyes:     Extraocular Movements: Extraocular movements intact.     Conjunctiva/sclera: Conjunctivae normal.     Pupils: Pupils are equal, round, and reactive to light.  Cardiovascular:     Rate and Rhythm: Normal rate and regular rhythm.  Pulmonary:     Effort: Pulmonary effort is normal.     Breath sounds: Normal breath sounds.  Chest:     Breasts: Breasts are symmetrical.        Right: Normal. No swelling, bleeding, inverted nipple, mass, nipple discharge, skin change or tenderness.        Left: Normal. No swelling, bleeding, inverted nipple, mass, nipple discharge, skin change or tenderness.  Abdominal:     Palpations: Abdomen is soft.   Musculoskeletal:     Cervical back: Neck supple.  Skin:    General: Skin is warm.  Neurological:     General: No focal deficit present.     Mental Status: She is alert and oriented to person, place, and time.  Psychiatric:        Mood and Affect: Mood normal.        Behavior: Behavior normal.        Thought Content: Thought content normal.        Judgment: Judgment normal.      No results found for any visits on 04/02/19.     Assessment & Plan    1. Fatty liver Incidental finding on CT. - Ambulatory referral to Gastroenterology - VITAMIN D 25 Hydroxy (Vit-D Deficiency, Fractures) - Vitamin B12 - CBC w/Diff/Platelet - Comprehensive Metabolic Panel (CMET) - TSH - Hemoglobin A1C - Sed Rate (ESR)  2. Gastroesophageal reflux disease, unspecified whether esophagitis present  - Ambulatory referral to Gastroenterology - VITAMIN D 25 Hydroxy (Vit-D Deficiency, Fractures) - Vitamin B12 - CBC w/Diff/Platelet - Comprehensive Metabolic Panel (CMET) - TSH - Hemoglobin A1C - Sed Rate (ESR)  3. Irritable bowel syndrome, unspecified type  - Ambulatory referral to Gastroenterology - VITAMIN D 25 Hydroxy (Vit-D Deficiency, Fractures) - Vitamin B12 - CBC w/Diff/Platelet - Comprehensive Metabolic Panel (CMET) - TSH - Hemoglobin A1C - Sed Rate (ESR)  4. History of regular medication use On pantoprazole. - Ambulatory referral to Gastroenterology - VITAMIN D 25 Hydroxy (Vit-D Deficiency, Fractures) - Vitamin B12 - CBC w/Diff/Platelet - Comprehensive Metabolic Panel (CMET) - TSH - Hemoglobin A1C - Sed Rate (ESR)  5. Muscular degeneration Followed by ophthalmology. - Ambulatory referral to Gastroenterology - VITAMIN D 25 Hydroxy (Vit-D Deficiency, Fractures) - Vitamin B12 - CBC w/Diff/Platelet - Comprehensive Metabolic Panel (CMET) - TSH - Hemoglobin A1C - Sed Rate (ESR)     Wilhemena Durie, MD  [facility]

## 2019-04-03 DIAGNOSIS — M6289 Other specified disorders of muscle: Secondary | ICD-10-CM | POA: Diagnosis not present

## 2019-04-03 DIAGNOSIS — K589 Irritable bowel syndrome without diarrhea: Secondary | ICD-10-CM | POA: Diagnosis not present

## 2019-04-03 DIAGNOSIS — K219 Gastro-esophageal reflux disease without esophagitis: Secondary | ICD-10-CM | POA: Diagnosis not present

## 2019-04-03 DIAGNOSIS — Z9229 Personal history of other drug therapy: Secondary | ICD-10-CM | POA: Diagnosis not present

## 2019-04-03 DIAGNOSIS — K760 Fatty (change of) liver, not elsewhere classified: Secondary | ICD-10-CM | POA: Diagnosis not present

## 2019-04-04 LAB — HEMOGLOBIN A1C
Est. average glucose Bld gHb Est-mCnc: 108 mg/dL
Hgb A1c MFr Bld: 5.4 % (ref 4.8–5.6)

## 2019-04-04 LAB — COMPREHENSIVE METABOLIC PANEL
ALT: 10 IU/L (ref 0–32)
AST: 14 IU/L (ref 0–40)
Albumin/Globulin Ratio: 1.8 (ref 1.2–2.2)
Albumin: 4.2 g/dL (ref 3.8–4.9)
Alkaline Phosphatase: 74 IU/L (ref 39–117)
BUN/Creatinine Ratio: 23 (ref 9–23)
BUN: 16 mg/dL (ref 6–24)
Bilirubin Total: 0.3 mg/dL (ref 0.0–1.2)
CO2: 23 mmol/L (ref 20–29)
Calcium: 9.2 mg/dL (ref 8.7–10.2)
Chloride: 102 mmol/L (ref 96–106)
Creatinine, Ser: 0.69 mg/dL (ref 0.57–1.00)
GFR calc Af Amer: 111 mL/min/{1.73_m2} (ref >59)
GFR calc non Af Amer: 96 mL/min/{1.73_m2} (ref >59)
Globulin, Total: 2.3 g/dL (ref 1.5–4.5)
Glucose: 75 mg/dL (ref 65–99)
Potassium: 4.1 mmol/L (ref 3.5–5.2)
Sodium: 140 mmol/L (ref 134–144)
Total Protein: 6.5 g/dL (ref 6.0–8.5)

## 2019-04-04 LAB — VITAMIN D 25 HYDROXY (VIT D DEFICIENCY, FRACTURES): Vit D, 25-Hydroxy: 17.6 ng/mL — ABNORMAL LOW (ref 30.0–100.0)

## 2019-04-04 LAB — CBC WITH DIFFERENTIAL/PLATELET
Basophils Absolute: 0 10*3/uL (ref 0.0–0.2)
Basos: 1 %
EOS (ABSOLUTE): 0.1 10*3/uL (ref 0.0–0.4)
Eos: 2 %
Hematocrit: 39.3 % (ref 34.0–46.6)
Hemoglobin: 13.6 g/dL (ref 11.1–15.9)
Immature Grans (Abs): 0 10*3/uL (ref 0.0–0.1)
Immature Granulocytes: 0 %
Lymphocytes Absolute: 1.3 10*3/uL (ref 0.7–3.1)
Lymphs: 23 %
MCH: 31.9 pg (ref 26.6–33.0)
MCHC: 34.6 g/dL (ref 31.5–35.7)
MCV: 92 fL (ref 79–97)
Monocytes Absolute: 0.5 10*3/uL (ref 0.1–0.9)
Monocytes: 8 %
Neutrophils Absolute: 3.8 10*3/uL (ref 1.4–7.0)
Neutrophils: 66 %
Platelets: 223 10*3/uL (ref 150–450)
RBC: 4.26 x10E6/uL (ref 3.77–5.28)
RDW: 12.2 % (ref 11.7–15.4)
WBC: 5.8 10*3/uL (ref 3.4–10.8)

## 2019-04-04 LAB — SEDIMENTATION RATE: Sed Rate: 2 mm/hr (ref 0–40)

## 2019-04-04 LAB — TSH: TSH: 0.423 u[IU]/mL — ABNORMAL LOW (ref 0.450–4.500)

## 2019-04-04 LAB — VITAMIN B12: Vitamin B-12: 297 pg/mL (ref 232–1245)

## 2019-04-05 ENCOUNTER — Encounter: Payer: Self-pay | Admitting: Obstetrics and Gynecology

## 2019-04-05 ENCOUNTER — Ambulatory Visit
Admission: RE | Admit: 2019-04-05 | Discharge: 2019-04-05 | Disposition: A | Discharge status: Home / Self Care | Payer: 59 | Source: Ambulatory Visit | Attending: Obstetrics and Gynecology | Admitting: Obstetrics and Gynecology

## 2019-04-05 DIAGNOSIS — Z1239 Encounter for other screening for malignant neoplasm of breast: Secondary | ICD-10-CM

## 2019-04-05 DIAGNOSIS — Z1231 Encounter for screening mammogram for malignant neoplasm of breast: Secondary | ICD-10-CM | POA: Diagnosis not present

## 2019-04-08 ENCOUNTER — Encounter: Payer: Self-pay | Admitting: Family Medicine

## 2019-04-08 ENCOUNTER — Telehealth: Payer: Self-pay

## 2019-04-08 NOTE — Telephone Encounter (Signed)
-----   Message from Jerrol Banana., MD sent at 04/07/2019  5:56 PM EST ----- Slightly low vitamin D.  Would double dose to 2 daily. Thyroid borderline overactive.  Would follow-up in 3 months to repeat both levels

## 2019-04-08 NOTE — Telephone Encounter (Signed)
Attempted to call patient- left message to call back  

## 2019-04-08 NOTE — Telephone Encounter (Signed)
Called patient and left voicemail for patient to return call.If patient returns call ok for PEC to advise patient of result message.

## 2019-04-08 NOTE — Telephone Encounter (Signed)
Attempted to reach pt. Again. Message left to call back.

## 2019-04-08 NOTE — Telephone Encounter (Signed)
Patient returned call- notified of lab results and PCP recommendations. 

## 2019-04-23 ENCOUNTER — Ambulatory Visit: Payer: 59 | Admitting: Internal Medicine

## 2019-04-29 ENCOUNTER — Encounter: Payer: Self-pay | Admitting: Family Medicine

## 2019-04-30 ENCOUNTER — Other Ambulatory Visit: Payer: Self-pay | Admitting: *Deleted

## 2019-04-30 DIAGNOSIS — E0590 Thyrotoxicosis, unspecified without thyrotoxic crisis or storm: Secondary | ICD-10-CM

## 2019-05-03 DIAGNOSIS — E0590 Thyrotoxicosis, unspecified without thyrotoxic crisis or storm: Secondary | ICD-10-CM | POA: Diagnosis not present

## 2019-05-06 DIAGNOSIS — R7989 Other specified abnormal findings of blood chemistry: Secondary | ICD-10-CM | POA: Diagnosis not present

## 2019-05-09 DIAGNOSIS — R7989 Other specified abnormal findings of blood chemistry: Secondary | ICD-10-CM | POA: Diagnosis not present

## 2019-06-17 ENCOUNTER — Ambulatory Visit (INDEPENDENT_AMBULATORY_CARE_PROVIDER_SITE_OTHER): Payer: 59 | Admitting: Dermatology

## 2019-06-17 ENCOUNTER — Other Ambulatory Visit: Payer: Self-pay

## 2019-06-17 DIAGNOSIS — L659 Nonscarring hair loss, unspecified: Secondary | ICD-10-CM

## 2019-06-17 DIAGNOSIS — Z840 Family history of diseases of the skin and subcutaneous tissue: Secondary | ICD-10-CM | POA: Diagnosis not present

## 2019-06-17 NOTE — Progress Notes (Signed)
   New Patient Visit  Subjective  Ashley Contreras is a 59 y.o. female who presents for the following: hair thinning (frontal scalp, since 02/2019, thyroid checked ~6wks ago wnl, vitamin D levels low has been on txt ~8 wks, no recent illness, surgery, physical stress, no evidence of rash or itching in area, anemic in past, father with hx of hairloss, pt is on hormone replacement and ~31yr ago her doctor lowered the hormone). No new meds other than vitamin formula for macular degeneration.   The following portions of the chart were reviewed this encounter and updated as appropriate:      Review of Systems:  No other skin or systemic complaints except as noted in HPI or Assessment and Plan.  Objective  Well appearing patient in no apparent distress; mood and affect are within normal limits.  A focused examination was performed including scalp. Relevant physical exam findings are noted in the Assessment and Plan.  Objective  Scalp: Thinning of hair at temporal and frontal scalp with widening of part at crown.  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  Start Rogaine 5% foam qhs to aa scalp. May increase to bid.  Need to try for 6 months to assess efficacy.  Then need to continue to maintain results. Discussed Red light hair caps/wands such as Revian- coupon given. Pt may also discuss increasing Estrogen supplementation with PCP Continue Vit D Pt asked for Hair specialist and recommended Dr. Oscar La in [facility]  CBC (no diff) - Scalp  Ferritin - Scalp  Return if symptoms worsen or fail to improve, for pt would like to schedule TBSE.     Documentation: I have reviewed the above documentation for accuracy and completeness, and I agree with the above.  Brendolyn Patty, MD  I, Othelia Pulling, RMA, am acting as  scribe for Brendolyn Patty, MD .

## 2019-06-17 NOTE — Patient Instructions (Addendum)
Hair Max hair wands/caps Capillus caps/wands [facility] in [facility]

## 2019-06-20 ENCOUNTER — Other Ambulatory Visit: Payer: Self-pay | Admitting: Ophthalmology

## 2019-06-20 DIAGNOSIS — H04123 Dry eye syndrome of bilateral lacrimal glands: Secondary | ICD-10-CM | POA: Diagnosis not present

## 2019-06-27 NOTE — Progress Notes (Signed)
Ashley Contreras,acting as a scribe for Ashley Mans, MD.,have documented all relevant documentation on the behalf of Ashley Mans, MD,as directed by  Ashley Mans, MD while in the presence of Ashley Mans, MD.  Complete physical exam   Patient: Ashley Contreras   DOB: Jun 27, 1960   59 y.o. Female  MRN: [id number] Visit Date: 07/04/2019  Today's healthcare provider: Megan Mans, MD   Chief Complaint  Patient presents with  . Annual Exam   Subjective    Ashley Contreras is a 58 y.o. female who presents today for a complete physical exam.  She reports consuming a general diet. Gym/ health club routine includes light weights and stair stepper . She generally feels well. She reports sleeping well. She does have additional problems to discuss today.  She sees Ashley Contreras at [facility] for her well woman care HPI  Patient would like to discuss dry eye and patient has also been diagnosed with sjogren's syndrome.  Needs referral to [facility] rhematology [phone]  She was seen by [facility] at the end of April and referred to [facility] rheumatology for dry eyes possible Sjogren's syndrome.  She also has a dry mouth.  Last colonoscopy- 02/18/2019 Last pap- 11/06/2018 Ashley Contreras)  Last mammogram- 04/05/19 Past Medical History:  Diagnosis Date  . Abnormal Pap smear 1989   s/p colposcopy  . Allergy    hay fever  . Arthritis   . Asthma   . BRCA negative 10/2018   MyRisk neg except ATM VUS  . Chicken pox   . Colon polyp   . Complication of anesthesia   . Family history of adverse reaction to anesthesia    sister - slow to wake  . Family history of breast cancer   . Fatty liver    seen by hepatology  . GERD (gastroesophageal reflux disease)   . Increased risk of breast cancer 10/2018   IBIS=24.7%/riskscore=15.8%  . Mitral valve prolapse   . PONV (postoperative nausea and vomiting)   . Shingles    waist, right side  . UTI (lower urinary tract  infection)   . Wears contact lenses    Past Surgical History:  Procedure Laterality Date  . ABDOMINAL HYSTERECTOMY  2008  . BREAST BIOPSY Left    benign  . calcaneous osteotomy  06/2011  . COLONOSCOPY WITH PROPOFOL N/A 02/17/2017   Procedure: COLONOSCOPY WITH PROPOFOL;  Surgeon: Ashley Minium, MD;  Location: [facility];  Service: Endoscopy;  Laterality: N/A;  . ESOPHAGOGASTRODUODENOSCOPY (EGD) WITH PROPOFOL N/A 02/17/2017   Procedure: ESOPHAGOGASTRODUODENOSCOPY (EGD) WITH PROPOFOL;  Surgeon: Ashley Minium, MD;  Location: [facility];  Service: Endoscopy;  Laterality: N/A;  . foot and ankle surgery    . OOPHORECTOMY    . TONSILLECTOMY AND ADENOIDECTOMY  2008   Social History   Socioeconomic History  . Marital status: Married    Spouse name: Not on file  . Number of children: 2  . Years of education: Not on file  . Highest education level: Not on file  Occupational History  . Occupation: Catering manager: Ashley Contreras  Tobacco Use  . Smoking status: Never Smoker  . Smokeless tobacco: Never Used  Substance and Sexual Activity  . Alcohol use: No  . Drug use: No  . Sexual activity: Yes    Birth control/protection: Surgical    Comment: Hysterectomy  Other Topics Concern  . Not on file  Social History Narrative  Lives in [place], 2 children. No pets.      Work - [employer]      Diet - regular, healthy   Exercise -bike sometimes, limited by injury   Social Determinants of Corporate investment banker Strain:   . Difficulty of Paying Living Expenses:   Food Insecurity:   . Worried About Programme researcher, broadcasting/film/video in the Last Year:   . Barista in the Last Year:   Transportation Needs:   . Freight forwarder (Medical):   Marland Kitchen Lack of Transportation (Non-Medical):   Physical Activity:   . Days of Exercise per Week:   . Minutes of Exercise per Session:   Stress:   . Feeling of Stress :   Social Connections:   . Frequency of Communication  with Friends and Family:   . Frequency of Social Gatherings with Friends and Family:   . Attends Religious Services:   . Active Member of Clubs or Organizations:   . Attends Banker Meetings:   Marland Kitchen Marital Status:   Intimate Partner Violence:   . Fear of Current or Ex-Partner:   . Emotionally Abused:   Marland Kitchen Physically Abused:   . Sexually Abused:    Family Status  Relation Name Status  . Mother  Deceased  . Father  Deceased  . Sister  Alive  . Sister  Alive  . Brother  Alive  . Sister  Alive  . Brother  Alive  . Other [employer]  . Mat Aunt  Deceased  . MGM  Deceased  . Mat Aunt  Deceased   Family History  Problem Relation Age of Onset  . Arthritis Mother   . Hypertension Mother   . Mental illness Mother   . Diabetes Mother   . Breast cancer Mother 35       right breast  . Hyperlipidemia Father   . Heart disease Father   . Cancer Father        lung  . Mental illness Sister   . Myasthenia gravis Sister   . Multiple sclerosis Sister   . Multiple sclerosis Other   . Stroke Maternal Aunt   . Breast cancer Maternal Aunt 70       right breast  . Stroke Maternal Grandmother   . Hypertension Maternal Grandmother   . Diabetes Maternal Grandmother   . Breast cancer Maternal Aunt 72       right breast   Allergies  Allergen Reactions  . Penicillins Swelling  . Cinnamon     Mouth and tongue become raw    Patient Care Team: Maple Hudson., MD as PCP - General (Family Medicine)   Medications: Outpatient Medications Prior to Visit  Medication Sig  . acetaminophen (TYLENOL) 500 MG tablet Take 500 mg by mouth every 4 (four) hours as needed.  . calcium-vitamin D (OSCAL WITH D) 500-200 MG-UNIT TABS tablet Take by mouth.  . estradiol (VIVELLE-DOT) 0.0375 MG/24HR Place 1 patch onto the skin 2 (two) times a week.  . fluticasone (FLONASE) 50 MCG/ACT nasal spray Place 2 sprays into both nostrils daily.  Marland Kitchen loratadine (CLARITIN) 10 MG tablet Take by mouth.  .  Multiple Vitamins-Minerals (PRESERVISION AREDS 2 PO) Take by mouth 2 (two) times daily.  . pantoprazole (PROTONIX) 40 MG tablet Take 1 tablet (40 mg total) by mouth 2 (two) times daily.  . sucralfate (CARAFATE) 1 g tablet Take 1 tablet (1 g total) by mouth 4 (four) times daily -  with meals and at bedtime.  . valACYclovir (VALTREX) 1000 MG tablet Take 2 tablets at the onset of a fever blister and then take 2 tablets 12 hours later. As needed   No facility-administered medications prior to visit.    Review of Systems  Constitutional: Negative.   HENT: Negative.   Eyes: Positive for itching.  Respiratory: Negative.   Cardiovascular: Negative.   Gastrointestinal: Negative.   Endocrine: Positive for cold intolerance.  Genitourinary: Negative.   Musculoskeletal: Negative.   Skin: Negative.   Allergic/Immunologic: Positive for environmental allergies.  Neurological: Negative.   Hematological: Negative.   Psychiatric/Behavioral: Negative.        Objective    BP 101/69 (BP Location: Left Arm, Patient Position: Sitting, Cuff Size: Normal)   Pulse 76   Temp (!) 96.9 F (36.1 C) (Temporal)   Ht 5\' 6"  (1.676 m)   Wt 144 lb 12.8 oz (65.7 kg)   BMI 23.37 kg/m  Wt Readings from Last 3 Encounters:  07/04/19 144 lb 12.8 oz (65.7 kg)  04/02/19 144 lb 3.2 oz (65.4 kg)  11/06/18 146 lb (66.2 kg)      Physical Exam Vitals reviewed.  Constitutional:      Appearance: Normal appearance.  HENT:     Right Ear: Tympanic membrane, ear canal and external ear normal.     Left Ear: Tympanic membrane, ear canal and external ear normal.     Nose: Nose normal.     Mouth/Throat:     Mouth: Mucous membranes are dry.     Pharynx: Oropharynx is clear.  Eyes:     Extraocular Movements: Extraocular movements intact.     Conjunctiva/sclera: Conjunctivae normal.     Pupils: Pupils are equal, round, and reactive to light.  Cardiovascular:     Rate and Rhythm: Normal rate and regular rhythm.   Pulmonary:     Effort: Pulmonary effort is normal.     Breath sounds: Normal breath sounds.  Chest:     Breasts: Breasts are symmetrical.        Right: Normal. No swelling, bleeding, inverted nipple, mass, nipple discharge, skin change or tenderness.        Left: Normal. No swelling, bleeding, inverted nipple, mass, nipple discharge, skin change or tenderness.  Abdominal:     Palpations: Abdomen is soft.  Musculoskeletal:     Cervical back: Neck supple.  Skin:    General: Skin is warm.  Neurological:     General: No focal deficit present.     Mental Status: She is alert and oriented to person, place, and time.  Psychiatric:        Mood and Affect: Mood normal.        Behavior: Behavior normal.        Thought Content: Thought content normal.        Judgment: Judgment normal.       Depression Screen  PHQ 2/9 Scores 06/28/2018 06/16/2017 12/19/2014  PHQ - 2 Score 0 0 0    No results found for any visits on 07/04/19.  Assessment & Plan    Routine Health Maintenance and Physical Exam  Exercise Activities and Dietary recommendations Goals   None     Immunization History  Administered Date(s) Administered  . Hep A / Hep B 04/29/2016, 06/02/2016, 11/09/2016  . Influenza Whole 11/22/2014, 11/26/2015  . Influenza-Unspecified 10/23/2011, 11/26/2014, 11/10/2016  . Tdap 09/22/2010    Health Maintenance  Topic Date Due  . HIV Screening  Never done  .  INFLUENZA VACCINE  09/22/2019  . TETANUS/TDAP  09/21/2020  . MAMMOGRAM  04/04/2021  . PAP SMEAR-Modifier  11/05/2021  . COLONOSCOPY  02/18/2027  . Hepatitis C Screening  Completed    Discussed health benefits of physical activity, and encouraged her to engage in regular exercise appropriate for her age and condition.  1. Annual physical exam   2. Gastroesophageal reflux disease, unspecified whether esophagitis present  - TSH - Lipid panel - CBC with Differential/Platelet - Comprehensive metabolic panel - Vitamin D  (25 hydroxy) - POCT urinalysis dipstick - Ferritin  3. Subclinical hyperthyroidism Followed by endocrinology, Dr. Tedd Sias. - TSH - Lipid panel - CBC with Differential/Platelet - Comprehensive metabolic panel - Vitamin D (25 hydroxy) - POCT urinalysis dipstick - Ferritin  4. Sjogren's syndrome, with unspecified organ involvement (HCC) Possible Sjogren's syndrome.  No lab work-up as we await rheumatology referral. - Ambulatory referral to Rheumatology  5. Hematuria, unspecified We will follow-up urine on next visit.  I do not think this is a clinical concern.  Micro negative - Urine Culture  6. Alopecia    No follow-ups on file.     I, Ashley Mans, MD, have reviewed all documentation for this visit. The documentation on 07/08/19 for the exam, diagnosis, procedures, and orders are all accurate and complete.    Amario Longmore Wendelyn Breslow, MD  [facility] [phone] (phone) [phone] (fax)  [facility]

## 2019-07-04 ENCOUNTER — Other Ambulatory Visit: Payer: Self-pay

## 2019-07-04 ENCOUNTER — Encounter: Payer: Self-pay | Admitting: Family Medicine

## 2019-07-04 ENCOUNTER — Ambulatory Visit (INDEPENDENT_AMBULATORY_CARE_PROVIDER_SITE_OTHER): Payer: 59 | Admitting: Family Medicine

## 2019-07-04 VITALS — BP 101/69 | HR 76 | Temp 96.9°F | Ht 66.0 in | Wt 144.8 lb

## 2019-07-04 DIAGNOSIS — K219 Gastro-esophageal reflux disease without esophagitis: Secondary | ICD-10-CM | POA: Diagnosis not present

## 2019-07-04 DIAGNOSIS — Z0000 Encounter for general adult medical examination without abnormal findings: Secondary | ICD-10-CM

## 2019-07-04 DIAGNOSIS — E0590 Thyrotoxicosis, unspecified without thyrotoxic crisis or storm: Secondary | ICD-10-CM

## 2019-07-04 DIAGNOSIS — L659 Nonscarring hair loss, unspecified: Secondary | ICD-10-CM | POA: Diagnosis not present

## 2019-07-04 DIAGNOSIS — M3500 Sicca syndrome, unspecified: Secondary | ICD-10-CM

## 2019-07-04 DIAGNOSIS — R319 Hematuria, unspecified: Secondary | ICD-10-CM | POA: Diagnosis not present

## 2019-07-04 LAB — POCT URINALYSIS DIPSTICK
Bilirubin, UA: NEGATIVE
Glucose, UA: NEGATIVE
Ketones, UA: NEGATIVE
Leukocytes, UA: NEGATIVE
Nitrite, UA: NEGATIVE
Protein, UA: NEGATIVE
Spec Grav, UA: 1.01 (ref 1.010–1.025)
Urobilinogen, UA: 0.2 E.U./dL
pH, UA: 5 (ref 5.0–8.0)

## 2019-07-05 LAB — COMPREHENSIVE METABOLIC PANEL
ALT: 12 IU/L (ref 0–32)
AST: 17 IU/L (ref 0–40)
Albumin/Globulin Ratio: 2 (ref 1.2–2.2)
Albumin: 4.4 g/dL (ref 3.8–4.9)
Alkaline Phosphatase: 73 IU/L (ref 39–117)
BUN/Creatinine Ratio: 24 — ABNORMAL HIGH (ref 9–23)
BUN: 16 mg/dL (ref 6–24)
Bilirubin Total: 0.3 mg/dL (ref 0.0–1.2)
CO2: 24 mmol/L (ref 20–29)
Calcium: 9.1 mg/dL (ref 8.7–10.2)
Chloride: 104 mmol/L (ref 96–106)
Creatinine, Ser: 0.67 mg/dL (ref 0.57–1.00)
GFR calc Af Amer: 112 mL/min/{1.73_m2} (ref >59)
GFR calc non Af Amer: 97 mL/min/{1.73_m2} (ref >59)
Globulin, Total: 2.2 g/dL (ref 1.5–4.5)
Glucose: 92 mg/dL (ref 65–99)
Potassium: 4.7 mmol/L (ref 3.5–5.2)
Sodium: 140 mmol/L (ref 134–144)
Total Protein: 6.6 g/dL (ref 6.0–8.5)

## 2019-07-05 LAB — CBC WITH DIFFERENTIAL/PLATELET
Basophils Absolute: 0.1 10*3/uL (ref 0.0–0.2)
Basos: 1 %
EOS (ABSOLUTE): 0.1 10*3/uL (ref 0.0–0.4)
Eos: 2 %
Hematocrit: 43.2 % (ref 34.0–46.6)
Hemoglobin: 14.6 g/dL (ref 11.1–15.9)
Immature Grans (Abs): 0 10*3/uL (ref 0.0–0.1)
Immature Granulocytes: 0 %
Lymphocytes Absolute: 1.2 10*3/uL (ref 0.7–3.1)
Lymphs: 28 %
MCH: 32.4 pg (ref 26.6–33.0)
MCHC: 33.8 g/dL (ref 31.5–35.7)
MCV: 96 fL (ref 79–97)
Monocytes Absolute: 0.4 10*3/uL (ref 0.1–0.9)
Monocytes: 10 %
Neutrophils Absolute: 2.5 10*3/uL (ref 1.4–7.0)
Neutrophils: 59 %
Platelets: 206 10*3/uL (ref 150–450)
RBC: 4.51 x10E6/uL (ref 3.77–5.28)
RDW: 12.5 % (ref 11.7–15.4)
WBC: 4.2 10*3/uL (ref 3.4–10.8)

## 2019-07-05 LAB — VITAMIN D 25 HYDROXY (VIT D DEFICIENCY, FRACTURES): Vit D, 25-Hydroxy: 28.3 ng/mL — ABNORMAL LOW (ref 30.0–100.0)

## 2019-07-05 LAB — FERRITIN: Ferritin: 59 ng/mL (ref 15–150)

## 2019-07-05 LAB — URINE CULTURE: Culture: <10000 — AB

## 2019-07-05 LAB — LIPID PANEL
Chol/HDL Ratio: 3.3 ratio (ref 0.0–4.4)
Cholesterol, Total: 192 mg/dL (ref 100–199)
HDL: 59 mg/dL (ref >39)
LDL Chol Calc (NIH): 117 mg/dL — ABNORMAL HIGH (ref 0–99)
Triglycerides: 90 mg/dL (ref 0–149)
VLDL Cholesterol Cal: 16 mg/dL (ref 5–40)

## 2019-07-05 LAB — TSH: TSH: 0.7 u[IU]/mL (ref 0.450–4.500)

## 2019-07-08 ENCOUNTER — Telehealth: Payer: Self-pay

## 2019-07-08 NOTE — Telephone Encounter (Signed)
-----   Message from Jerrol Banana., MD sent at 07/07/2019 10:39 AM EDT ----- Labs stable.

## 2019-07-08 NOTE — Telephone Encounter (Signed)
Patient given lab results through [facility] and has read and seen Dr. Alben Spittle comments.

## 2019-07-18 ENCOUNTER — Other Ambulatory Visit: Payer: Self-pay | Admitting: Family Medicine

## 2019-07-18 ENCOUNTER — Other Ambulatory Visit: Payer: Self-pay | Admitting: Obstetrics and Gynecology

## 2019-07-18 ENCOUNTER — Encounter: Payer: Self-pay | Admitting: Obstetrics and Gynecology

## 2019-07-18 ENCOUNTER — Encounter: Payer: Self-pay | Admitting: Family Medicine

## 2019-07-18 MED ORDER — ESTRADIOL 0.0375 MG/24HR TD PTTW: 1.0000 | MEDICATED_PATCH | TRANSDERMAL | 1 refills | Status: DC

## 2019-07-18 NOTE — Progress Notes (Signed)
Rx RF vivelle dot, per PCP pref.

## 2019-07-18 NOTE — Telephone Encounter (Signed)
Please advise 

## 2019-07-18 NOTE — Telephone Encounter (Signed)
Requested medication (s) are due for refill today: no  Requested medication (s) are on the active medication list: yes  Last refill:  06/07/2019  Future visit scheduled: yes  Notes to clinic: looks like this was refused by another office. Just want to make sure patient still taking and is due for refill   Requested Prescriptions  Pending Prescriptions Disp Refills   estradiol (VIVELLE-DOT) 0.0375 MG/24HR [Pharmacy Med Name: ESTRADIOL PATCH 0.0375 0.0375 Patch] 8 patch 12    Sig: PLACE 1 PATCH ONTO THE SKIN 2 TIMES A WEEK.      OB/GYN:  Estrogens Passed - 07/18/2019  9:07 AM      Passed - Mammogram is up-to-date per Health Maintenance      Passed - Last BP in normal range    BP Readings from Last 1 Encounters:  07/04/19 101/69          Passed - Valid encounter within last 12 months    Recent Outpatient Visits           2 weeks ago Annual physical exam   [facility] Jerrol Banana., MD   3 months ago Irritable bowel syndrome, unspecified type   [facility] Jerrol Banana., MD   1 year ago Annual physical exam   [facility] Jerrol Banana., MD       Future Appointments             In 3 months Brendolyn Patty, MD [place]   In 5 months Jerrol Banana., MD [facility] [place]

## 2019-07-19 DIAGNOSIS — E0590 Thyrotoxicosis, unspecified without thyrotoxic crisis or storm: Secondary | ICD-10-CM | POA: Diagnosis not present

## 2019-08-06 DIAGNOSIS — H353112 Nonexudative age-related macular degeneration, right eye, intermediate dry stage: Secondary | ICD-10-CM | POA: Diagnosis not present

## 2019-09-23 ENCOUNTER — Other Ambulatory Visit: Payer: Self-pay | Admitting: Family Medicine

## 2019-10-17 ENCOUNTER — Other Ambulatory Visit: Payer: Self-pay | Admitting: Family Medicine

## 2019-10-17 DIAGNOSIS — K2950 Unspecified chronic gastritis without bleeding: Secondary | ICD-10-CM

## 2019-10-21 DIAGNOSIS — H16223 Keratoconjunctivitis sicca, not specified as Sjogren's, bilateral: Secondary | ICD-10-CM | POA: Diagnosis not present

## 2019-10-21 DIAGNOSIS — J302 Other seasonal allergic rhinitis: Secondary | ICD-10-CM | POA: Diagnosis not present

## 2019-10-29 ENCOUNTER — Other Ambulatory Visit: Payer: Self-pay

## 2019-10-29 ENCOUNTER — Other Ambulatory Visit: Payer: Self-pay | Admitting: Dermatology

## 2019-10-29 ENCOUNTER — Ambulatory Visit (INDEPENDENT_AMBULATORY_CARE_PROVIDER_SITE_OTHER): Payer: 59 | Admitting: Dermatology

## 2019-10-29 DIAGNOSIS — D229 Melanocytic nevi, unspecified: Secondary | ICD-10-CM | POA: Diagnosis not present

## 2019-10-29 DIAGNOSIS — L853 Xerosis cutis: Secondary | ICD-10-CM

## 2019-10-29 DIAGNOSIS — Z1283 Encounter for screening for malignant neoplasm of skin: Secondary | ICD-10-CM | POA: Diagnosis not present

## 2019-10-29 DIAGNOSIS — D2361 Other benign neoplasm of skin of right upper limb, including shoulder: Secondary | ICD-10-CM

## 2019-10-29 DIAGNOSIS — L814 Other melanin hyperpigmentation: Secondary | ICD-10-CM

## 2019-10-29 DIAGNOSIS — Z86018 Personal history of other benign neoplasm: Secondary | ICD-10-CM

## 2019-10-29 DIAGNOSIS — L578 Other skin changes due to chronic exposure to nonionizing radiation: Secondary | ICD-10-CM

## 2019-10-29 DIAGNOSIS — L719 Rosacea, unspecified: Secondary | ICD-10-CM

## 2019-10-29 DIAGNOSIS — H04523 Eversion of bilateral lacrimal punctum: Secondary | ICD-10-CM | POA: Diagnosis not present

## 2019-10-29 DIAGNOSIS — L720 Epidermal cyst: Secondary | ICD-10-CM

## 2019-10-29 DIAGNOSIS — D485 Neoplasm of uncertain behavior of skin: Secondary | ICD-10-CM

## 2019-10-29 DIAGNOSIS — D1800 Hemangioma unspecified site: Secondary | ICD-10-CM

## 2019-10-29 DIAGNOSIS — L820 Inflamed seborrheic keratosis: Secondary | ICD-10-CM | POA: Diagnosis not present

## 2019-10-29 DIAGNOSIS — L299 Pruritus, unspecified: Secondary | ICD-10-CM

## 2019-10-29 DIAGNOSIS — D239 Other benign neoplasm of skin, unspecified: Secondary | ICD-10-CM

## 2019-10-29 DIAGNOSIS — L821 Other seborrheic keratosis: Secondary | ICD-10-CM

## 2019-10-29 MED ORDER — TRIAMCINOLONE ACETONIDE 0.1 % EX CREA
TOPICAL_CREAM | CUTANEOUS | 1 refills | Status: DC
Start: 2019-10-29 — End: 2019-10-29

## 2019-10-29 NOTE — Progress Notes (Signed)
Follow-Up Visit   Subjective  Ashley Contreras is a 59 y.o. female who presents for the following: Annual Exam (Patient here today for TBSE. She does have a spot at the right lower leg that she noticed a few months ago, especially when shaving. No symptoms.).  Patient advises she has had biopsies in the past with [facility] but could only search back to 2008. 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  The following portions of the chart were reviewed this encounter and updated as appropriate:      Review of Systems:  No other skin or systemic complaints except as noted in HPI or Assessment and Plan.  Objective  Well appearing patient in no apparent distress; mood and affect are within normal limits.  A full examination was performed including scalp, head, eyes, ears, nose, lips, neck, chest, axillae, abdomen, back, buttocks, bilateral upper extremities, bilateral lower extremities, hands, feet, fingers, toes, fingernails, and toenails. All findings within normal limits unless otherwise noted below.  Objective  Mid Back: Mild xerosis with few small pink papules and excoriations  Objective  Right Shoulder - Posterior: Firm pink/brown papulenodule with dimple sign.   Objective  Right Medial Lower Leg: 70mm firm brown papule     Objective  Right lower cheek: Firm subq nodule 31mm  Objective  Nose: Mild erythema at nasal tip   Assessment & Plan  Xerosis cutis Mid Back  With Pruritus Start TMC 0.1% cream 1-2 times daily to affected areas as needed for itch.  Avoid applying to face, groin, and axilla. Use as directed. Risk of skin atrophy with long-term use reviewed.   Topical steroids (such as triamcinolone, fluocinolone, fluocinonide, mometasone, clobetasol, halobetasol, betamethasone, hydrocortisone) can cause thinning and lightening of the skin if they are used for  too long in the same area. Your physician has selected the right strength medicine for your problem and area affected on the body. Please use your medication only as directed by your physician to prevent side effects.    Ordered Medications: triamcinolone cream (KENALOG) 0.1 %  Dermatofibroma Right Shoulder - Posterior  Benign, observe.    Neoplasm of uncertain behavior of skin Right Medial Lower Leg  Skin / nail biopsy Type of biopsy: tangential   Informed consent: discussed and consent obtained   Patient was prepped and draped in usual sterile fashion: Area prepped with alcohol. Anesthesia: the lesion was anesthetized in a standard fashion   Anesthetic:  1% lidocaine w/ epinephrine 1-100,000 buffered w/ 8.4% NaHCO3 Instrument used: flexible razor blade   Hemostasis achieved with: pressure, aluminum chloride and electrodesiccation   Outcome: patient tolerated procedure well   Post-procedure details: wound care instructions given   Post-procedure details comment:  Ointment and small bandage applied  Specimen 1 - Surgical pathology Differential Diagnosis: Dermatofibroma vs Irritated Nevus Check Margins: No 69mm firm brown papule  Dermatofibroma vrs irritated nevus, shave bx today  Epidermal inclusion cyst Right lower cheek  Reassured benign growth.  Recommend observation.  Discussed surgical excision in office if changes noted or symptomatic.   Rosacea Nose  Mild Sample of Soolantra given to patient. Apply nightly.  Call for Rx if effective.  Recommend daily broad spectrum sunscreen SPF 30+ to face qam, reapply every 2 hours as needed.    Lentigines - Scattered tan macules back, chest, arms - Discussed due to sun exposure - Benign, observe - Call  for any changes  Seborrheic Keratoses - Stuck-on, waxy, tan-brown macules and papules back, lower legs, arms - Discussed benign etiology and prognosis. - Observe - Call for any changes - Discussed cosmetic procedure,  noncovered.  $60 for 1st lesion and $15 for each additional lesion if done on the same day.  Maximum charge $350.  One touch-up treatment included no charge. Discussed risks of treatment including dyspigmentation, small scar, and/or recurrence.  Pt interested in scheduling appt for treatment  Melanocytic Nevi - Tan-brown and/or pink-flesh-colored symmetric macules and papules back - Benign appearing on exam today - Observation - Call clinic for new or changing moles - Recommend daily use of broad spectrum spf 30+ sunscreen to sun-exposed areas.   Hemangiomas - Red papules back, chest, arms - Discussed benign nature - Observe - Call for any changes - Discussed cosmetic procedure, noncovered.  $60 for 1st lesion and $15 for each additional lesion if done on the same day.  Maximum charge $350.  One touch-up treatment included no charge. Discussed risks of treatment including dyspigmentation, small scar, and/or recurrence.  Actinic Damage - diffuse scaly erythematous macules with underlying dyspigmentation chest - Recommend daily broad spectrum sunscreen SPF 30+ to sun-exposed areas, reapply every 2 hours as needed.  - Call for new or changing lesions.  Skin cancer screening performed today.  History of Dysplastic Nevi - No evidence of recurrence today at left lower thigh, spinal mid back.  - Recommend regular full body skin exams - Recommend daily broad spectrum sunscreen SPF 30+ to sun-exposed areas, reapply every 2 hours as needed.  - Call if any new or changing lesions are noted between office visits  Return in about 1 year (around 10/28/2020) for TBSE, PRN for cosmetic SK's, hemangiomas.  Graciella Belton, RMA, am acting as scribe for Brendolyn Patty, MD . Documentation: I have reviewed the above documentation for accuracy and completeness, and I agree with the above.  Brendolyn Patty MD

## 2019-10-29 NOTE — Patient Instructions (Addendum)
Topical steroids (such as triamcinolone, fluocinolone, fluocinonide, mometasone, clobetasol, halobetasol, betamethasone, hydrocortisone) can cause thinning and lightening of the skin if they are used for too long in the same area. Your physician has selected the right strength medicine for your problem and area affected on the body. Please use your medication only as directed by your physician to prevent side effects.   Wound Care Instructions  1. Cleanse wound gently with soap and water once a day then pat dry with clean gauze. Apply a thing coat of Petrolatum (petroleum jelly, "Vaseline") over the wound (unless you have an allergy to this). We recommend that you use a new, sterile tube of Vaseline. Do not pick or remove scabs. Do not remove the yellow or white "healing tissue" from the base of the wound.  2. Cover the wound with fresh, clean, nonstick gauze and secure with paper tape. You may use Band-Aids in place of gauze and tape if the would is small enough, but would recommend trimming much of the tape off as there is often too much. Sometimes Band-Aids can irritate the skin.  3. You should call the office for your biopsy report after 1 week if you have not already been contacted.  4. If you experience any problems, such as abnormal amounts of bleeding, swelling, significant bruising, significant pain, or evidence of infection, please call the office immediately.  5. FOR ADULT SURGERY PATIENTS: If you need something for pain relief you may take 1 extra strength Tylenol (acetaminophen) AND 2 Ibuprofen (200mg  each) together every 4 hours as needed for pain. (do not take these if you are allergic to them or if you have a reason you should not take them.) Typically, you may only need pain medication for 1 to 3 days.   Rosacea  What is rosacea? Rosacea (say: ro-zay-sha) is a common skin disease that usually begins as a trend of flushing or blushing easily.  As rosacea progresses, a persistent  redness in the center of the face will develop and may gradually spread beyond the nose and cheeks to the forehead and chin.  In some cases, the ears, chest, and back could be affected.  Rosacea may appear as tiny blood vessels or small red bumps that occur in crops.  Frequently they can contain pus, and are called "pustules".  If the bumps do not contain pus, they are referred to as "papules".  Rarely, in prolonged, untreated cases of rosacea, the oil glands of the nose and cheeks may become permanently enlarged.  This is called rhinophyma, and is seen more frequently in men.  Signs and Risks In its beginning stages, rosacea tends to come and go, which makes it difficult to recognize.  It can start as intermittent flushing of the face.  Eventually, blood vessels may become permanently visible.  Pustules and papules can appear, but can be mistaken for adult acne.  People of all races, ages, genders and ethnic groups are at risk of developing rosacea.  However, it is more common in women (especially around menopause) and adults with fair skin between the ages of 34 and 2.  Treatment Dermatologists typically recommend a combination of treatments to effectively manage rosacea.  Treatment can improve symptoms and may stop the progression of the rosacea.  Treatment may involve both topical and oral medications.  The tetracycline antibiotics are often used for their anti-inflammatory effect; however, because of the possibility of developing antibiotic resistance, they should not be used long term at full dose.  For  dilated blood vessels the options include electrodessication (uses electric current through a small needle), laser treatment, and cosmetics to hide the redness.   With all forms of treatment, improvement is a slow process, and patients may not see any results for the first 3-4 weeks.  It is very important to avoid the sun and other triggers.  Patients must wear sunscreen daily.  Skin Care  Instructions: 1. Cleanse the skin with a mild soap such as CeraVe cleanser, Cetaphil cleanser, or Dove soap once or twice daily as needed. 2. Moisturize with Eucerin Redness Relief Daily Perfecting Lotion (has a subtle green tint), CeraVe Moisturizing Cream, or Oil of Olay Daily Moisturizer with sunscreen every morning and/or night as recommended. 3. Makeup should be "non-comedogenic" (won't clog pores) and be labeled "for sensitive skin". Good choices for cosmetics are: Neutrogena, Almay, and Physician's Formula.  Any product with a green tint tends to offset a red complexion. 4. If your eyes are dry and irritated, use artificial tears 2-3 times per day and cleanse the eyelids daily with baby shampoo.  Have your eyes examined at least every 2 years.  Be sure to tell your eye doctor that you have rosacea. 5. Alcoholic beverages tend to cause flushing of the skin, and may make rosacea worse. 6. Always wear sunscreen, protect your skin from extreme hot and cold temperatures, and avoid spicy foods, hot drinks, and mechanical irritation such as rubbing, scrubbing, or massaging the face.  Avoid harsh skin cleansers, cleansing masks, astringents, and exfoliation. If a particular product burns or makes your face feel tight, then it is likely to flare your rosacea. 7. If you are having difficulty finding a sunscreen that you can tolerate, you may try switching to a chemical-free sunscreen.  These are ones whose active ingredient is zinc oxide or titanium dioxide only.  They should also be fragrance free, non-comedogenic, and labeled for sensitive skin. 8. Rosacea triggers may vary from person to person.  There are a variety of foods that have been reported to trigger rosacea.  Some patients find that keeping a diary of what they were doing when they flared helps them avoid triggers.

## 2019-11-04 ENCOUNTER — Telehealth: Payer: Self-pay

## 2019-11-04 NOTE — Telephone Encounter (Signed)
Left message advising patient the biopsy done on her leg was a benign seborrheic keratosis. Patient to call office with any questions or concerns.

## 2019-11-05 DIAGNOSIS — R946 Abnormal results of thyroid function studies: Secondary | ICD-10-CM | POA: Diagnosis not present

## 2019-11-10 ENCOUNTER — Encounter: Payer: Self-pay | Admitting: Obstetrics and Gynecology

## 2019-11-10 NOTE — Progress Notes (Signed)
PCP: Jerrol Banana., MD   Chief Complaint  Patient presents with   Annual Exam    HPI:      [name] is a 59 y.o. No obstetric history on file. who LMP was No LMP recorded. Patient has had a hysterectomy., presents today for her annual examination.  Her menses are absent due to supracervical hyst BSO for menorrhagia/endometriosis 2008. No PMB. She rarely has vasomotor sx on vivelle-dot 0.0375 mg. Doing well and wants to continue.   Sex activity: single partner, contraception - status post hysterectomy. She does nothave vaginal dryness.  Last Pap: 11/06/18  Results were: no abnormalities /neg HPV DNA.  Hx of STDs: none  Last mammogram: 04/05/19  Results were: normal--routine follow-up in 12 months There is a FH of breast cancer in her mom and 2 mat aunts. Pt is MyRisk neg except ATM VUS 9/20. IBIS=24.7%/riskscore=15.8%. There is no FH of ovarian cancer. The patient does do self-breast exams. Has not had screening breast MRI yet but interested this year.  Colonoscopy: 12/18 without abnormalities;  Repeat due after 5 years due to hx of polyps in past. Pt has a hx of ext hemorrhoid, sometimes with flare. Would like RF of analpram, used in past with relief.  DEXA: normal DEXA in spine and hip at [facility] 2016.   Tobacco use: The patient denies current or previous tobacco use. Alcohol use: none  No drug use Exercise: moderately active  She does get adequate calcium and Vitamin D in her diet. Hx of Vit D deficiency.  Labs with PCP. Being eval for Sjogren's with rheumatology due to dry eyes/mouth. Had inconclusive labs but being treated the same.  Also diagnosed with macular degeneration this yr.    Past Medical History:  Diagnosis Date   Abnormal Pap smear 1989   s/p colposcopy   Allergy    hay fever   Arthritis    Asthma    BRCA negative 10/2018   MyRisk neg except ATM VUS   Chicken pox    Colon polyp    Complication of anesthesia    Family history of  adverse reaction to anesthesia    sister - slow to wake   Family history of breast cancer    Fatty liver    seen by hepatology   GERD (gastroesophageal reflux disease)    Increased risk of breast cancer 10/2018   IBIS=24.7%/riskscore=15.8%   Mitral valve prolapse    PONV (postoperative nausea and vomiting)    Shingles    waist, right side   UTI (lower urinary tract infection)    Wears contact lenses     Past Surgical History:  Procedure Laterality Date   ABDOMINAL HYSTERECTOMY  2008   BREAST BIOPSY Left    benign   calcaneous osteotomy  06/2011   COLONOSCOPY WITH PROPOFOL N/A 02/17/2017   Procedure: COLONOSCOPY WITH PROPOFOL;  Surgeon: Lucilla Lame, MD;  Location: [facility];  Service: Endoscopy;  Laterality: N/A;   ESOPHAGOGASTRODUODENOSCOPY (EGD) WITH PROPOFOL N/A 02/17/2017   Procedure: ESOPHAGOGASTRODUODENOSCOPY (EGD) WITH PROPOFOL;  Surgeon: Lucilla Lame, MD;  Location: [facility];  Service: Endoscopy;  Laterality: N/A;   foot and ankle surgery     OOPHORECTOMY     TONSILLECTOMY AND ADENOIDECTOMY  2008    Family History  Problem Relation Age of Onset   Arthritis Mother    Hypertension Mother    Mental illness Mother    Diabetes Mother    Breast cancer Mother  57       right breast   Hyperlipidemia Father    Heart disease Father    Cancer Father        lung   Mental illness Sister    Myasthenia gravis Sister    Multiple sclerosis Sister    Multiple sclerosis Other    Stroke Maternal Aunt    Breast cancer Maternal Aunt 70       right breast   Stroke Maternal Grandmother    Hypertension Maternal Grandmother    Diabetes Maternal Grandmother    Breast cancer Maternal Aunt 72       right breast    Social History   Socioeconomic History   Marital status: Married    Spouse name: Not on file   Number of children: 2   Years of education: Not on file   Highest education level: Not on file  Occupational  History   Occupation: Transport planner    Employer: [employer]  Tobacco Use   Smoking status: Never Smoker   Smokeless tobacco: Never Used  Scientific laboratory technician Use: Never used  Substance and Sexual Activity   Alcohol use: No   Drug use: No   Sexual activity: Yes    Birth control/protection: Surgical    Comment: Hysterectomy  Other Topics Concern   Not on file  Social History Narrative   Lives in [place], 2 children. No pets.      Work - [employer] - regular, healthy   Exercise -bike sometimes, limited by injury   Social Determinants of Radio broadcast assistant Strain:    Difficulty of Paying Living Expenses: Not on file  Food Insecurity:    Worried About Charity fundraiser in the Last Year: Not on file   [employer] of Food in the Last Year: Not on file  Transportation Needs:    Lack of Transportation (Medical): Not on file   Lack of Transportation (Non-Medical): Not on file  Physical Activity:    Days of Exercise per Week: Not on file   Minutes of Exercise per Session: Not on file  Stress:    Feeling of Stress : Not on file  Social Connections:    Frequency of Communication with Friends and Family: Not on file   Frequency of Social Gatherings with Friends and Family: Not on file   Attends Religious Services: Not on file   Active Member of Clubs or Organizations: Not on file   Attends Archivist Meetings: Not on file   Marital Status: Not on file  Intimate Partner Violence:    Fear of Current or Ex-Partner: Not on file   Emotionally Abused: Not on file   Physically Abused: Not on file   Sexually Abused: Not on file     Current Outpatient Medications:    acetaminophen (TYLENOL) 500 MG tablet, Take 500 mg by mouth every 4 (four) hours as needed., Disp: , Rfl:    calcium-vitamin D (OSCAL WITH D) 500-200 MG-UNIT TABS tablet, Take by mouth., Disp: , Rfl:    estradiol (VIVELLE-DOT) 0.0375 MG/24HR, PLACE 1 PATCH ONTO  THE SKIN 2 TIMES A WEEK., Disp: 24 patch, Rfl: 3   fluticasone (FLONASE) 50 MCG/ACT nasal spray, INSTILL 2 SPRAYS INTO BOTH NOSTRILS DAILY, Disp: 16 g, Rfl: 2   hydrocortisone-pramoxine (ANALPRAM-HC) 2.5-1 % rectal cream, Place rectally 3 (three) times daily. Prn sx, Disp: 30 g, Rfl: 1   loratadine (CLARITIN)  10 MG tablet, Take by mouth., Disp: , Rfl:    Multiple Vitamins-Minerals (PRESERVISION AREDS 2 PO), Take by mouth 2 (two) times daily., Disp: , Rfl:    pantoprazole (PROTONIX) 40 MG tablet, TAKE 1 TABLET BY MOUTH 2 TIMES DAILY., Disp: 180 tablet, Rfl: 1   sucralfate (CARAFATE) 1 g tablet, Take 1 tablet (1 g total) by mouth 4 (four) times daily -  with meals and at bedtime., Disp: 120 tablet, Rfl: 11   triamcinolone cream (KENALOG) 0.1 %, Apply 1-2 times daily to affected areas as needed for itch. Avoid applying to face, groin, and axilla. Use as directed. Risk of skin atrophy with long-term use reviewed., Disp: 80 g, Rfl: 1   valACYclovir (VALTREX) 1000 MG tablet, Take 2 tablets at the onset of a fever blister and then take 2 tablets 12 hours later. As needed, Disp: 30 tablet, Rfl: 1     ROS:  Review of Systems  Constitutional: Negative for fatigue, fever and unexpected weight change.  Respiratory: Negative for cough, shortness of breath and wheezing.   Cardiovascular: Negative for chest pain, palpitations and leg swelling.  Gastrointestinal: Negative for blood in stool, constipation, diarrhea, nausea and vomiting.  Endocrine: Negative for cold intolerance, heat intolerance and polyuria.  Genitourinary: Negative for dyspareunia, dysuria, flank pain, frequency, genital sores, hematuria, menstrual problem, pelvic pain, urgency, vaginal bleeding, vaginal discharge and vaginal pain.  Musculoskeletal: Negative for back pain, joint swelling and myalgias.  Skin: Negative for rash.  Neurological: Positive for dizziness. Negative for syncope, light-headedness, numbness and headaches.    Hematological: Negative for adenopathy.  Psychiatric/Behavioral: Negative for agitation, confusion, sleep disturbance and suicidal ideas. The patient is not nervous/anxious.   BREAST: No symptoms    Objective: BP 126/84    Ht _0  (1.676 m)    Wt 146 lb (66.2 kg)    BMI 23.57 kg/m    Physical Exam Constitutional:      Appearance: She is well-developed.  Genitourinary:     Vulva, vagina and cervix normal.     No vaginal discharge, erythema or tenderness.     Uterus is not tender.     Uterus is absent.     No right or left adnexal mass present.     Right adnexa absent.     Right adnexa not tender.     Left adnexa absent.     Left adnexa not tender.     Genitourinary Comments: UTERUS SURG REM  Rectum:     External hemorrhoid present.  Neck:     Thyroid: No thyromegaly.  Cardiovascular:     Rate and Rhythm: Normal rate and regular rhythm.     Heart sounds: Normal heart sounds. No murmur heard.   Pulmonary:     Effort: Pulmonary effort is normal.     Breath sounds: Normal breath sounds.  Chest:     Breasts:        Right: No mass, nipple discharge, skin change or tenderness.        Left: No mass, nipple discharge, skin change or tenderness.  Abdominal:     Palpations: Abdomen is soft.     Tenderness: There is no abdominal tenderness. There is no guarding.  Musculoskeletal:        General: Normal range of motion.     Cervical back: Normal range of motion.  Neurological:     General: No focal deficit present.     Mental Status: She is alert and oriented to person, place,  and time.     Cranial Nerves: No cranial nerve deficit.  Skin:    General: Skin is warm and dry.  Psychiatric:        Mood and Affect: Mood normal.        Behavior: Behavior normal.        Thought Content: Thought content normal.        Judgment: Judgment normal.  Vitals reviewed.     Assessment/Plan:  Encounter for annual routine gynecological examination  Encounter for screening mammogram  for malignant neoplasm of breast - Plan: MM 3D SCREEN BREAST BILATERAL; pt to sched mammo  Family history of breast cancer  Increased risk of breast cancer--pt aware of monthly SBE, yearly CBE and mammos as well as scr breast MRI. Will call after mammo next yr to sched MRI. Cont Vit D supp.   Hormone replacement therapy (HRT) - Plan: estradiol (VIVELLE-DOT) 0.0375 MG/24HR; Rx RF vivelle. Doing well.  External hemorrhoid - Plan: hydrocortisone-pramoxine (ANALPRAM-HC) 2.5-1 % rectal cream; Rx analpram prn. F/u prn.   Meds ordered this encounter  Medications   hydrocortisone-pramoxine (ANALPRAM-HC) 2.5-1 % rectal cream    Sig: Place rectally 3 (three) times daily. Prn sx    Dispense:  30 g    Refill:  1    Order Specific Question:   Supervising Provider    Answer:   Gae Dry [[id number]]   estradiol (VIVELLE-DOT) 0.0375 MG/24HR    Sig: PLACE 1 PATCH ONTO THE SKIN 2 TIMES A WEEK.    Dispense:  24 patch    Refill:  3    Order Specific Question:   Supervising Provider    Answer:   Gae Dry [[id number]]           GYN counsel breast self exam, mammography screening, adequate intake of calcium and vitamin D, diet and exercise    F/U  Return in about 1 year (around 11/10/2020).  Darci Lykins B. Rishab Stoudt, PA-C 11/11/2019 9:37 AM

## 2019-11-11 ENCOUNTER — Other Ambulatory Visit: Payer: Self-pay | Admitting: Obstetrics and Gynecology

## 2019-11-11 ENCOUNTER — Other Ambulatory Visit: Payer: Self-pay

## 2019-11-11 ENCOUNTER — Ambulatory Visit (INDEPENDENT_AMBULATORY_CARE_PROVIDER_SITE_OTHER): Payer: 59 | Admitting: Obstetrics and Gynecology

## 2019-11-11 ENCOUNTER — Encounter: Payer: Self-pay | Admitting: Obstetrics and Gynecology

## 2019-11-11 VITALS — BP 126/84 | Ht 66.0 in | Wt 146.0 lb

## 2019-11-11 DIAGNOSIS — Z01419 Encounter for gynecological examination (general) (routine) without abnormal findings: Secondary | ICD-10-CM | POA: Diagnosis not present

## 2019-11-11 DIAGNOSIS — Z79890 Hormone replacement therapy (postmenopausal): Secondary | ICD-10-CM | POA: Diagnosis not present

## 2019-11-11 DIAGNOSIS — Z1231 Encounter for screening mammogram for malignant neoplasm of breast: Secondary | ICD-10-CM

## 2019-11-11 DIAGNOSIS — K644 Residual hemorrhoidal skin tags: Secondary | ICD-10-CM | POA: Insufficient documentation

## 2019-11-11 DIAGNOSIS — Z9189 Other specified personal risk factors, not elsewhere classified: Secondary | ICD-10-CM | POA: Insufficient documentation

## 2019-11-11 DIAGNOSIS — Z803 Family history of malignant neoplasm of breast: Secondary | ICD-10-CM | POA: Diagnosis not present

## 2019-11-11 MED ORDER — ESTRADIOL 0.0375 MG/24HR TD PTTW: MEDICATED_PATCH | TRANSDERMAL | 3 refills | Status: DC

## 2019-11-11 MED ORDER — HYDROCORT-PRAMOXINE (PERIANAL) 2.5-1 % EX CREA: TOPICAL_CREAM | Freq: Three times a day (TID) | CUTANEOUS | 1 refills | Status: DC

## 2019-11-11 NOTE — Patient Instructions (Signed)
I value your feedback and entrusting us with your care. If you get a [facility] patient survey, I would appreciate you taking the time to let us know about your experience today. Thank you! ° °As of January 31, 2019, your lab results will be released to your MyChart immediately, before I even have a chance to see them. Please give me time to review them and contact you if there are any abnormalities. Thank you for your patience.  ° °[facility]: [phone] ° ° ° °

## 2019-11-12 DIAGNOSIS — R7989 Other specified abnormal findings of blood chemistry: Secondary | ICD-10-CM | POA: Diagnosis not present

## 2019-12-06 ENCOUNTER — Other Ambulatory Visit: Payer: Self-pay | Admitting: Family Medicine

## 2019-12-23 ENCOUNTER — Other Ambulatory Visit: Payer: Self-pay | Admitting: Family Medicine

## 2019-12-23 NOTE — Telephone Encounter (Signed)
E-Prescribing Status: Verification status not available for this orderRx sent to pharmacy 12/06/19- message not delivered- Rx resent to pharmacy

## 2020-01-03 NOTE — Progress Notes (Signed)
I,April Miller,acting as a scribe for Megan Mans, MD.,have documented all relevant documentation on the behalf of Megan Mans, MD,as directed by  Megan Mans, MD while in the presence of Megan Mans, MD.   Established patient visit   Patient: Ashley Contreras   DOB: 01-Sep-1960   59 y.o. Female  MRN: [id number] Visit Date: 01/06/2020  Today's healthcare provider: Megan Mans, MD   Chief Complaint  Patient presents with  . Follow-up   Subjective    HPI  Patient's rheumatology work-up for Sjogren's was negative.  She does have dry eyes followed by ophthalmology and is being treated by eyedrops. States her hands and feet burn all the time.  She has had a low normal B12 in the past. Gastroesophageal reflux disease, unspecified whether esophagitis present From 07/04/2019-labs stable.  Sjogren's syndrome, with unspecified organ involvement (HCC) From 07/04/2019-Possible Sjogren's syndrome.  No lab work-up as we await rheumatology referral.  Hematuria, unspecified From 07/04/2019-We will follow-up urine on next visit.  I do not think this is a clinical concern.  Micro negative.       Medications: Outpatient Medications Prior to Visit  Medication Sig  . acetaminophen (TYLENOL) 500 MG tablet Take 500 mg by mouth every 4 (four) hours as needed.  . calcium-vitamin D (OSCAL WITH D) 500-200 MG-UNIT TABS tablet Take by mouth.  . estradiol (VIVELLE-DOT) 0.0375 MG/24HR PLACE 1 PATCH ONTO THE SKIN 2 TIMES A WEEK.  . fluticasone (FLONASE) 50 MCG/ACT nasal spray INSTILL 2 SPRAYS INTO BOTH NOSTRILS DAILY  . hydrocortisone-pramoxine (ANALPRAM-HC) 2.5-1 % rectal cream Place rectally 3 (three) times daily. Prn sx  . loratadine (CLARITIN) 10 MG tablet Take by mouth.  . Multiple Vitamins-Minerals (PRESERVISION AREDS 2 PO) Take by mouth 2 (two) times daily.  . pantoprazole (PROTONIX) 40 MG tablet TAKE 1 TABLET BY MOUTH 2 TIMES DAILY.  Marland Kitchen sucralfate (CARAFATE) 1 g  tablet Take 1 tablet (1 g total) by mouth 4 (four) times daily -  with meals and at bedtime.  . triamcinolone cream (KENALOG) 0.1 % Apply 1-2 times daily to affected areas as needed for itch. Avoid applying to face, groin, and axilla. Use as directed. Risk of skin atrophy with long-term use reviewed.  . valACYclovir (VALTREX) 1000 MG tablet TAKE 2 TABLETS AT THE ONSET OF A FEVER BLISTER AND THEN TAKE 2 TABLETS 12 HOURS LATER. AS NEEDED   No facility-administered medications prior to visit.    Review of Systems  Constitutional: Negative for appetite change, chills, fatigue and fever.  Respiratory: Negative for chest tightness and shortness of breath.   Cardiovascular: Negative for chest pain and palpitations.  Gastrointestinal: Negative for abdominal pain, nausea and vomiting.  Neurological: Negative for dizziness and weakness.       Objective    BP 100/73 (BP Location: Right Arm, Patient Position: Sitting, Cuff Size: Normal)   Pulse 67   Temp 97.9 F (36.6 C) (Oral)   Resp 16   Ht 5\' 6"  (1.676 m)   Wt 146 lb (66.2 kg)   SpO2 99%   BMI 23.57 kg/m  Wt Readings from Last 3 Encounters:  01/06/20 146 lb (66.2 kg)  11/11/19 146 lb (66.2 kg)  07/04/19 144 lb 12.8 oz (65.7 kg)      Physical Exam Vitals reviewed.  Constitutional:      Appearance: Normal appearance.  HENT:     Right Ear: Tympanic membrane, ear canal and external ear normal.  Left Ear: Tympanic membrane, ear canal and external ear normal.     Nose: Nose normal.     Mouth/Throat:     Mouth: Mucous membranes are dry.     Pharynx: Oropharynx is clear.  Eyes:     Extraocular Movements: Extraocular movements intact.     Conjunctiva/sclera: Conjunctivae normal.     Pupils: Pupils are equal, round, and reactive to light.  Cardiovascular:     Rate and Rhythm: Normal rate and regular rhythm.  Pulmonary:     Effort: Pulmonary effort is normal.     Breath sounds: Normal breath sounds.  Chest:     Breasts: Breasts  are symmetrical.        Right: Normal. No swelling, bleeding, inverted nipple, mass, nipple discharge, skin change or tenderness.        Left: Normal. No swelling, bleeding, inverted nipple, mass, nipple discharge, skin change or tenderness.  Abdominal:     Palpations: Abdomen is soft.  Musculoskeletal:     Cervical back: Neck supple.  Skin:    General: Skin is warm.  Neurological:     General: No focal deficit present.     Mental Status: She is alert and oriented to person, place, and time.  Psychiatric:        Mood and Affect: Mood normal.        Behavior: Behavior normal.        Thought Content: Thought content normal.        Judgment: Judgment normal.   PHQ-9 score is 1   No results found for any visits on 01/06/20.  Assessment & Plan     1. Postmenopausal On Vivelle dot  2. Dry eyes On Xidra drops per ophthalmology  3. Tingling sensation Possible mild neuropathy symptoms in a patient who is been low normal B12.  Add B12 and folic acid to daily regimen.  Follow-up B12 next year  4. Gastroesophageal reflux disease, unspecified whether esophagitis present On chronic pantoprazole  5. H/O malignant melanoma of skin Followed by dermatology 6.  History of hematuria UA on next visit.  No follow-ups on file.         Nesiah Jump Wendelyn Breslow, MD  [facility] [phone] (phone) [phone] (fax)  [facility]

## 2020-01-06 ENCOUNTER — Other Ambulatory Visit: Payer: Self-pay

## 2020-01-06 ENCOUNTER — Ambulatory Visit (INDEPENDENT_AMBULATORY_CARE_PROVIDER_SITE_OTHER): Payer: 59 | Admitting: Family Medicine

## 2020-01-06 ENCOUNTER — Encounter: Payer: Self-pay | Admitting: Family Medicine

## 2020-01-06 VITALS — BP 100/73 | HR 67 | Temp 97.9°F | Resp 16 | Ht 66.0 in | Wt 146.0 lb

## 2020-01-06 DIAGNOSIS — R202 Paresthesia of skin: Secondary | ICD-10-CM

## 2020-01-06 DIAGNOSIS — H04123 Dry eye syndrome of bilateral lacrimal glands: Secondary | ICD-10-CM

## 2020-01-06 DIAGNOSIS — Z85820 Personal history of malignant melanoma of skin: Secondary | ICD-10-CM | POA: Diagnosis not present

## 2020-01-06 DIAGNOSIS — Z780 Asymptomatic menopausal state: Secondary | ICD-10-CM | POA: Diagnosis not present

## 2020-01-06 DIAGNOSIS — K219 Gastro-esophageal reflux disease without esophagitis: Secondary | ICD-10-CM

## 2020-01-06 NOTE — Patient Instructions (Signed)
Try over-the-counter Vitamin K15 and Folic Acid daily. For dry eyes Xiidra eye drops.

## 2020-01-24 DIAGNOSIS — R1013 Epigastric pain: Secondary | ICD-10-CM | POA: Diagnosis not present

## 2020-01-24 DIAGNOSIS — K760 Fatty (change of) liver, not elsewhere classified: Secondary | ICD-10-CM | POA: Diagnosis not present

## 2020-01-24 DIAGNOSIS — R110 Nausea: Secondary | ICD-10-CM | POA: Diagnosis not present

## 2020-02-12 ENCOUNTER — Other Ambulatory Visit: Payer: Self-pay | Admitting: Family Medicine

## 2020-02-12 DIAGNOSIS — K2950 Unspecified chronic gastritis without bleeding: Secondary | ICD-10-CM

## 2020-02-12 NOTE — Telephone Encounter (Signed)
Requested medication (s) are due for refill today - expired Rx  Requested medication (s) are on the active medication list -yes  Future visit scheduled -yes  Last refill: 06/28/18  Notes to clinic: Request RF of expired Rx  Requested Prescriptions  Pending Prescriptions Disp Refills   sucralfate (CARAFATE) 1 g tablet [Pharmacy Med Name: SUCRALFATE 1 GM TABLET 1 Tablet] 120 tablet 11    Sig: TAKE 1 TABLET BY MOUTH 4 TIMES DAILY - WITH MEALS AND AT BEDTIME.      Gastroenterology: Antiacids Passed - 02/12/2020  8:01 AM      Passed - Valid encounter within last 12 months    Recent Outpatient Visits           1 month ago Postmenopausal   [facility] Jerrol Banana., MD   7 months ago Annual physical exam   [facility] Jerrol Banana., MD   10 months ago Irritable bowel syndrome, unspecified type   [facility] Jerrol Banana., MD   1 year ago Annual physical exam   [facility] Jerrol Banana., MD       Future Appointments             In 2 weeks Brendolyn Patty, MD [place]   In 4 months Jerrol Banana., MD [facility]                 Requested Prescriptions  Pending Prescriptions Disp Refills   sucralfate (CARAFATE) 1 g tablet [Pharmacy Med Name: SUCRALFATE 1 GM TABLET 1 Tablet] 120 tablet 11    Sig: TAKE 1 TABLET BY MOUTH 4 TIMES DAILY - WITH MEALS AND AT BEDTIME.      Gastroenterology: Antiacids Passed - 02/12/2020  8:01 AM      Passed - Valid encounter within last 12 months    Recent Outpatient Visits           1 month ago Postmenopausal   [facility] Jerrol Banana., MD   7 months ago Annual physical exam   [facility] Jerrol Banana., MD   10 months ago Irritable bowel syndrome, unspecified type   [facility] Jerrol Banana., MD   1 year ago Annual physical exam    [facility] Jerrol Banana., MD       Future Appointments             In 2 weeks Brendolyn Patty, MD [place]   In 4 months Jerrol Banana., MD [facility] [place]

## 2020-02-20 DIAGNOSIS — K8020 Calculus of gallbladder without cholecystitis without obstruction: Secondary | ICD-10-CM | POA: Diagnosis not present

## 2020-02-20 DIAGNOSIS — K760 Fatty (change of) liver, not elsewhere classified: Secondary | ICD-10-CM | POA: Diagnosis not present

## 2020-03-03 ENCOUNTER — Ambulatory Visit: Payer: Self-pay | Admitting: Dermatology

## 2020-03-13 ENCOUNTER — Other Ambulatory Visit: Payer: Self-pay

## 2020-03-13 ENCOUNTER — Encounter (INDEPENDENT_AMBULATORY_CARE_PROVIDER_SITE_OTHER): Payer: 59 | Admitting: Ophthalmology

## 2020-03-13 DIAGNOSIS — H43813 Vitreous degeneration, bilateral: Secondary | ICD-10-CM | POA: Diagnosis not present

## 2020-03-13 DIAGNOSIS — H353132 Nonexudative age-related macular degeneration, bilateral, intermediate dry stage: Secondary | ICD-10-CM | POA: Diagnosis not present

## 2020-03-13 DIAGNOSIS — H5213 Myopia, bilateral: Secondary | ICD-10-CM | POA: Diagnosis not present

## 2020-03-13 DIAGNOSIS — H2513 Age-related nuclear cataract, bilateral: Secondary | ICD-10-CM | POA: Diagnosis not present

## 2020-04-19 DIAGNOSIS — B9689 Other specified bacterial agents as the cause of diseases classified elsewhere: Secondary | ICD-10-CM | POA: Diagnosis not present

## 2020-04-19 DIAGNOSIS — N760 Acute vaginitis: Secondary | ICD-10-CM | POA: Diagnosis not present

## 2020-04-27 DIAGNOSIS — H353132 Nonexudative age-related macular degeneration, bilateral, intermediate dry stage: Secondary | ICD-10-CM | POA: Diagnosis not present

## 2020-05-05 ENCOUNTER — Other Ambulatory Visit: Payer: Self-pay | Admitting: Obstetrics and Gynecology

## 2020-05-05 DIAGNOSIS — Z1231 Encounter for screening mammogram for malignant neoplasm of breast: Secondary | ICD-10-CM

## 2020-05-15 ENCOUNTER — Encounter: Payer: Self-pay | Admitting: Family Medicine

## 2020-05-16 ENCOUNTER — Emergency Department: Payer: 59

## 2020-05-16 ENCOUNTER — Inpatient Hospital Stay: Payer: 59

## 2020-05-16 ENCOUNTER — Other Ambulatory Visit: Payer: Self-pay

## 2020-05-16 ENCOUNTER — Inpatient Hospital Stay
Admission: EM | Admit: 2020-05-16 | Discharge: 2020-05-19 | DRG: 418 | Disposition: A | Discharge status: Home / Self Care | Payer: 59 | Attending: Internal Medicine | Admitting: Internal Medicine

## 2020-05-16 DIAGNOSIS — E876 Hypokalemia: Secondary | ICD-10-CM | POA: Diagnosis not present

## 2020-05-16 DIAGNOSIS — R109 Unspecified abdominal pain: Secondary | ICD-10-CM

## 2020-05-16 DIAGNOSIS — Z8261 Family history of arthritis: Secondary | ICD-10-CM

## 2020-05-16 DIAGNOSIS — Z83438 Family history of other disorder of lipoprotein metabolism and other lipidemia: Secondary | ICD-10-CM | POA: Diagnosis not present

## 2020-05-16 DIAGNOSIS — K8500 Idiopathic acute pancreatitis without necrosis or infection: Secondary | ICD-10-CM | POA: Diagnosis not present

## 2020-05-16 DIAGNOSIS — Z833 Family history of diabetes mellitus: Secondary | ICD-10-CM | POA: Diagnosis not present

## 2020-05-16 DIAGNOSIS — Z20822 Contact with and (suspected) exposure to covid-19: Secondary | ICD-10-CM | POA: Diagnosis not present

## 2020-05-16 DIAGNOSIS — Z79899 Other long term (current) drug therapy: Secondary | ICD-10-CM | POA: Diagnosis not present

## 2020-05-16 DIAGNOSIS — Z820 Family history of epilepsy and other diseases of the nervous system: Secondary | ICD-10-CM | POA: Diagnosis not present

## 2020-05-16 DIAGNOSIS — K8020 Calculus of gallbladder without cholecystitis without obstruction: Secondary | ICD-10-CM | POA: Diagnosis not present

## 2020-05-16 DIAGNOSIS — K760 Fatty (change of) liver, not elsewhere classified: Secondary | ICD-10-CM | POA: Diagnosis not present

## 2020-05-16 DIAGNOSIS — K838 Other specified diseases of biliary tract: Secondary | ICD-10-CM | POA: Diagnosis not present

## 2020-05-16 DIAGNOSIS — Z79890 Hormone replacement therapy (postmenopausal): Secondary | ICD-10-CM | POA: Diagnosis not present

## 2020-05-16 DIAGNOSIS — R739 Hyperglycemia, unspecified: Secondary | ICD-10-CM | POA: Diagnosis present

## 2020-05-16 DIAGNOSIS — R1011 Right upper quadrant pain: Secondary | ICD-10-CM | POA: Diagnosis not present

## 2020-05-16 DIAGNOSIS — K839 Disease of biliary tract, unspecified: Secondary | ICD-10-CM | POA: Diagnosis not present

## 2020-05-16 DIAGNOSIS — K812 Acute cholecystitis with chronic cholecystitis: Secondary | ICD-10-CM | POA: Diagnosis present

## 2020-05-16 DIAGNOSIS — Z880 Allergy status to penicillin: Secondary | ICD-10-CM | POA: Diagnosis not present

## 2020-05-16 DIAGNOSIS — Z803 Family history of malignant neoplasm of breast: Secondary | ICD-10-CM

## 2020-05-16 DIAGNOSIS — K8051 Calculus of bile duct without cholangitis or cholecystitis with obstruction: Secondary | ICD-10-CM | POA: Diagnosis not present

## 2020-05-16 DIAGNOSIS — K219 Gastro-esophageal reflux disease without esophagitis: Secondary | ICD-10-CM | POA: Diagnosis not present

## 2020-05-16 DIAGNOSIS — Z8249 Family history of ischemic heart disease and other diseases of the circulatory system: Secondary | ICD-10-CM

## 2020-05-16 DIAGNOSIS — K8689 Other specified diseases of pancreas: Secondary | ICD-10-CM | POA: Diagnosis present

## 2020-05-16 DIAGNOSIS — R079 Chest pain, unspecified: Secondary | ICD-10-CM | POA: Diagnosis not present

## 2020-05-16 DIAGNOSIS — Z888 Allergy status to other drugs, medicaments and biological substances status: Secondary | ICD-10-CM

## 2020-05-16 DIAGNOSIS — R17 Unspecified jaundice: Secondary | ICD-10-CM

## 2020-05-16 DIAGNOSIS — K858 Other acute pancreatitis without necrosis or infection: Secondary | ICD-10-CM | POA: Diagnosis not present

## 2020-05-16 DIAGNOSIS — K644 Residual hemorrhoidal skin tags: Secondary | ICD-10-CM | POA: Diagnosis not present

## 2020-05-16 DIAGNOSIS — K2950 Unspecified chronic gastritis without bleeding: Secondary | ICD-10-CM | POA: Diagnosis present

## 2020-05-16 DIAGNOSIS — K851 Biliary acute pancreatitis without necrosis or infection: Secondary | ICD-10-CM | POA: Diagnosis not present

## 2020-05-16 DIAGNOSIS — R748 Abnormal levels of other serum enzymes: Secondary | ICD-10-CM | POA: Diagnosis not present

## 2020-05-16 DIAGNOSIS — N941 Unspecified dyspareunia: Secondary | ICD-10-CM | POA: Diagnosis not present

## 2020-05-16 DIAGNOSIS — K810 Acute cholecystitis: Secondary | ICD-10-CM | POA: Diagnosis not present

## 2020-05-16 LAB — BASIC METABOLIC PANEL
Anion gap: 8 (ref 5–15)
BUN: 16 mg/dL (ref 6–20)
CO2: 23 mmol/L (ref 22–32)
Calcium: 9 mg/dL (ref 8.9–10.3)
Chloride: 105 mmol/L (ref 98–111)
Creatinine, Ser: 0.59 mg/dL (ref 0.44–1.00)
GFR, Estimated: >60 mL/min (ref >60)
Glucose, Bld: 114 mg/dL — ABNORMAL HIGH (ref 70–99)
Potassium: 3.9 mmol/L (ref 3.5–5.1)
Sodium: 136 mmol/L (ref 135–145)

## 2020-05-16 LAB — CBC
HCT: 44.9 % (ref 36.0–46.0)
Hemoglobin: 15.3 g/dL — ABNORMAL HIGH (ref 12.0–15.0)
MCH: 32.4 pg (ref 26.0–34.0)
MCHC: 34.1 g/dL (ref 30.0–36.0)
MCV: 95.1 fL (ref 80.0–100.0)
Platelets: 217 10*3/uL (ref 150–400)
RBC: 4.72 MIL/uL (ref 3.87–5.11)
RDW: 12.4 % (ref 11.5–15.5)
WBC: 10 10*3/uL (ref 4.0–10.5)
nRBC: 0 % (ref 0.0–0.2)

## 2020-05-16 LAB — RESP PANEL BY RT-PCR (FLU A&B, COVID) ARPGX2
Influenza A by PCR: NEGATIVE
Influenza B by PCR: NEGATIVE
SARS Coronavirus 2 by RT PCR: NEGATIVE

## 2020-05-16 LAB — HEPATIC FUNCTION PANEL
ALT: 268 U/L — ABNORMAL HIGH (ref 0–44)
AST: 196 U/L — ABNORMAL HIGH (ref 15–41)
Albumin: 4.3 g/dL (ref 3.5–5.0)
Alkaline Phosphatase: 106 U/L (ref 38–126)
Bilirubin, Direct: 0.7 mg/dL — ABNORMAL HIGH (ref 0.0–0.2)
Indirect Bilirubin: 0.8 mg/dL (ref 0.3–0.9)
Total Bilirubin: 1.5 mg/dL — ABNORMAL HIGH (ref 0.3–1.2)
Total Protein: 7.1 g/dL (ref 6.5–8.1)

## 2020-05-16 LAB — TROPONIN I (HIGH SENSITIVITY): Troponin I (High Sensitivity): 3 ng/L (ref <18)

## 2020-05-16 LAB — LIPASE, BLOOD: Lipase: 624 U/L — ABNORMAL HIGH (ref 11–51)

## 2020-05-16 MED ORDER — ONDANSETRON HCL 4 MG/2ML IJ SOLN
4.0000 mg | Freq: Four times a day (QID) | INTRAMUSCULAR | Status: DC | PRN
  Administered 2020-05-17 (×2): 4 mg via INTRAVENOUS
  Filled 2020-05-16 (×2): qty 2

## 2020-05-16 MED ORDER — DEXTROSE IN LACTATED RINGERS 5 % IV SOLN: INTRAVENOUS | Status: DC

## 2020-05-16 MED ORDER — ONDANSETRON HCL 4 MG/2ML IJ SOLN
4.0000 mg | Freq: Once | INTRAMUSCULAR | Status: AC
  Administered 2020-05-16: 4 mg via INTRAVENOUS
  Filled 2020-05-16: qty 2

## 2020-05-16 MED ORDER — GADOBUTROL 1 MMOL/ML IV SOLN
5.0000 mL | Freq: Once | INTRAVENOUS | Status: AC | PRN
  Administered 2020-05-16: 7.5 mL via INTRAVENOUS

## 2020-05-16 MED ORDER — SODIUM CHLORIDE 0.9 % IV BOLUS
1000.0000 mL | Freq: Once | INTRAVENOUS | Status: AC
  Administered 2020-05-16: 1000 mL via INTRAVENOUS

## 2020-05-16 MED ORDER — HYDROMORPHONE HCL 1 MG/ML IJ SOLN
1.0000 mg | INTRAMUSCULAR | Status: DC | PRN
  Administered 2020-05-16 – 2020-05-19 (×6): 1 mg via INTRAVENOUS
  Filled 2020-05-16 (×6): qty 1

## 2020-05-16 MED ORDER — HYDROMORPHONE HCL 1 MG/ML IJ SOLN
0.5000 mg | Freq: Once | INTRAMUSCULAR | Status: AC
  Administered 2020-05-16: 0.5 mg via INTRAVENOUS
  Filled 2020-05-16: qty 1

## 2020-05-16 MED ORDER — ONDANSETRON HCL 4 MG PO TABS: 4.0000 mg | ORAL_TABLET | Freq: Four times a day (QID) | ORAL | Status: DC | PRN

## 2020-05-16 MED ORDER — HYDROMORPHONE HCL 1 MG/ML IJ SOLN
INTRAMUSCULAR | Status: AC
  Administered 2020-05-16: 1 mg via INTRAVENOUS
  Filled 2020-05-16: qty 1

## 2020-05-16 MED ORDER — ENOXAPARIN SODIUM 40 MG/0.4ML ~~LOC~~ SOLN
40.0000 mg | SUBCUTANEOUS | Status: DC
  Administered 2020-05-17: 40 mg via SUBCUTANEOUS
  Filled 2020-05-16: qty 0.4

## 2020-05-16 MED ORDER — HYDROMORPHONE HCL 1 MG/ML IJ SOLN: 1.0000 mg | INTRAMUSCULAR | Status: DC | PRN

## 2020-05-16 NOTE — ED Triage Notes (Signed)
Pt comes pov with some chest pain under her right breast. Pt has known gallstones. Pain started yesterday then has gotten worse over the past hour.

## 2020-05-16 NOTE — ED Provider Notes (Signed)
[facility] Emergency Department Provider Note  ____________________________________________   Event Date/Time   First MD Initiated Contact with Patient 05/16/20 1458     (approximate)  I have reviewed the triage vital signs and the nursing notes.   HISTORY  Chief Complaint Chest Pain    HPI  Ashley Contreras is a 60 y.o. female with asthma, history of fatty liver, GERD, gallstones who comes in with right upper quadrant abdominal pain.  Patient reports that she was told she had gallstones when they were doing an ultrasound of her liver.  She is has not had any issues with her gallbladder.  However yesterday she stated that she is have a little bit of pain and ate something and then the pain got a lot worse.  The pain got better at nighttime but then she ate at 10 AM and about around noon her pain got a lot worse, severe, constant, nothing makes it better, nothing makes it wors.  The pain does radiate up into her chest and around the right side of her back.  She denies any shortness of breath.  No lower abdominal pain, no urinary symptoms, no vomiting, no diarrhea          Past Medical History:  Diagnosis Date  . Abnormal Pap smear 1989   s/p colposcopy  . Allergy    hay fever  . Arthritis   . Asthma   . BRCA negative 10/2018   MyRisk neg except ATM VUS  . Chicken pox   . Colon polyp   . Complication of anesthesia   . Family history of adverse reaction to anesthesia    sister - slow to wake  . Family history of breast cancer   . Fatty liver    seen by hepatology  . GERD (gastroesophageal reflux disease)   . Increased risk of breast cancer 10/2018   IBIS=24.7%/riskscore=15.8%  . Mitral valve prolapse   . PONV (postoperative nausea and vomiting)   . Shingles    waist, right side  . UTI (lower urinary tract infection)   . Wears contact lenses     Patient Active Problem List   Diagnosis Date Noted  . Increased risk of breast cancer  11/11/2019  . External hemorrhoid 11/11/2019  . Family history of breast cancer 11/06/2018  . Special screening for malignant neoplasms, colon   . Fatty liver 04/29/2016  . Abdominal pain, chronic, right lower quadrant 01/29/2016  . Palpitations 01/29/2016  . Left breast mass 04/24/2015  . Subclinical hyperthyroidism 04/24/2015  . Encounter for general adult medical examination with abnormal findings 11/02/2012  . Personal history of colonic polyps 10/29/2012  . GERD (gastroesophageal reflux disease) 09/21/2012  . Chronic gastritis 09/21/2012  . Dyspareunia 04/25/2012  . Cervical radiculopathy 11/03/2011  . Seasonal allergies 10/21/2011    Past Surgical History:  Procedure Laterality Date  . ABDOMINAL HYSTERECTOMY  2008  . BREAST BIOPSY Left    benign  . calcaneous osteotomy  06/2011  . COLONOSCOPY WITH PROPOFOL N/A 02/17/2017   Procedure: COLONOSCOPY WITH PROPOFOL;  Surgeon: Lucilla Lame, MD;  Location: [place];  Service: Endoscopy;  Laterality: N/A;  . ESOPHAGOGASTRODUODENOSCOPY (EGD) WITH PROPOFOL N/A 02/17/2017   Procedure: ESOPHAGOGASTRODUODENOSCOPY (EGD) WITH PROPOFOL;  Surgeon: Lucilla Lame, MD;  Location: [place];  Service: Endoscopy;  Laterality: N/A;  . foot and ankle surgery    . OOPHORECTOMY    . TONSILLECTOMY AND ADENOIDECTOMY  2008    Prior to Admission medications  Medication Sig Start Date End Date Taking? Authorizing Provider  acetaminophen (TYLENOL) 500 MG tablet Take 500 mg by mouth every 4 (four) hours as needed.    [provider]  calcium-vitamin D (OSCAL WITH D) 500-200 MG-UNIT TABS tablet Take by mouth.    [provider]  estradiol (VIVELLE-DOT) 0.0375 MG/24HR PLACE 1 PATCH ONTO THE SKIN 2 TIMES A WEEK. 9/53/96   Copland, Elmo Putt B, PA-C  fluticasone (FLONASE) 50 MCG/ACT nasal spray INSTILL 2 SPRAYS INTO BOTH NOSTRILS DAILY 09/23/19   Jerrol Banana., MD  hydrocortisone-pramoxine [facility]) 2.5-1 %  rectal cream Place rectally 3 (three) times daily. Prn sx 09/18/95   Copland, Elmo Putt B, PA-C  loratadine (CLARITIN) 10 MG tablet Take by mouth.    [provider]  Multiple Vitamins-Minerals (PRESERVISION AREDS 2 PO) Take by mouth 2 (two) times daily.    [provider]  pantoprazole (PROTONIX) 40 MG tablet TAKE 1 TABLET BY MOUTH 2 TIMES DAILY. 10/17/19   Jerrol Banana., MD  sucralfate (CARAFATE) 1 g tablet TAKE 1 TABLET BY MOUTH 4 TIMES DAILY - WITH MEALS AND AT BEDTIME. 02/12/20   Jerrol Banana., MD  triamcinolone cream (KENALOG) 0.1 % Apply 1-2 times daily to affected areas as needed for itch. Avoid applying to face, groin, and axilla. Use as directed. Risk of skin atrophy with long-term use reviewed. 10/29/19   Brendolyn Patty, MD  valACYclovir (VALTREX) 1000 MG tablet TAKE 2 TABLETS AT THE ONSET OF A FEVER BLISTER AND THEN TAKE 2 TABLETS 12 HOURS LATER. AS NEEDED 12/23/19   Jerrol Banana., MD    Allergies Penicillins and Cinnamon  Family History  Problem Relation Age of Onset  . Arthritis Mother   . Hypertension Mother   . Mental illness Mother   . Diabetes Mother   . Breast cancer Mother 89       right breast  . Hyperlipidemia Father   . Heart disease Father   . Cancer Father        lung  . Mental illness Sister   . Myasthenia gravis Sister   . Multiple sclerosis Sister   . Multiple sclerosis Other   . Stroke Maternal Aunt   . Breast cancer Maternal Aunt 70       right breast  . Stroke Maternal Grandmother   . Hypertension Maternal Grandmother   . Diabetes Maternal Grandmother   . Breast cancer Maternal Aunt 72       right breast    Social History Social History   Tobacco Use  . Smoking status: Never Smoker  . Smokeless tobacco: Never Used  Vaping Use  . Vaping Use: Never used  Substance Use Topics  . Alcohol use: No  . Drug use: No      Review of Systems Constitutional: No fever/chills Eyes: No visual changes. ENT: No  sore throat. Cardiovascular: Positive chest pain Respiratory: Denies shortness of breath. Gastrointestinal: Positive abdominal pain no nausea, no vomiting.  No diarrhea.  No constipation. Genitourinary: Negative for dysuria. Musculoskeletal: Negative for back pain. Skin: Negative for rash. Neurological: Negative for headaches, focal weakness or numbness. All other ROS negative ____________________________________________   PHYSICAL EXAM:  VITAL SIGNS: ED Triage Vitals  Enc Vitals Group     BP 05/16/20 1439 (!) 149/91     Pulse Rate 05/16/20 1439 77     Resp 05/16/20 1439 18     Temp 05/16/20 1439 98.5 F (36.9 C)  Temp Source 05/16/20 1439 Oral     SpO2 05/16/20 1439 96 %     Weight 05/16/20 1437 145 lb (65.8 kg)     Height 05/16/20 1437 5' 6"  (1.676 m)     Head Circumference --      Peak Flow --      Pain Score 05/16/20 1437 8     Pain Loc --      Pain Edu? --      Excl. in [place]? --     Constitutional: Alert and oriented. Well appearing and in no acute distress. Eyes: Conjunctivae are normal. EOMI. Head: Atraumatic. Nose: No congestion/rhinnorhea. Mouth/Throat: Mucous membranes are moist.   Neck: No stridor. Trachea Midline. FROM Cardiovascular: Normal rate, regular rhythm. Grossly normal heart sounds.  Good peripheral circulation. Respiratory: Normal respiratory effort.  No retractions. Lungs CTAB. Gastrointestinal: Tender in the right upper quadrant.  No distention. No abdominal bruits.  Musculoskeletal: No lower extremity tenderness nor edema.  No joint effusions. Neurologic:  Normal speech and language. No gross focal neurologic deficits are appreciated.  Skin:  Skin is warm, dry and intact. No rash noted. Psychiatric: Mood and affect are normal. Speech and behavior are normal. GU: Deferred   ____________________________________________   LABS (all labs ordered are listed, but only abnormal results are displayed)  Labs Reviewed  BASIC METABOLIC PANEL -  Abnormal; Notable for the following components:      Result Value   Glucose, Bld 114 (*)    All other components within normal limits  CBC - Abnormal; Notable for the following components:   Hemoglobin 15.3 (*)    All other components within normal limits  HEPATIC FUNCTION PANEL  LIPASE, BLOOD  TROPONIN I (HIGH SENSITIVITY)   ____________________________________________   ED ECG REPORT I, Vanessa Hackensack, the attending physician, personally viewed and interpreted this ECG.  Normal sinus rate of 79, no ST elevation, no T wave inversions, normal intervals ____________________________________________  RADIOLOGY Robert Bellow, personally viewed and evaluated these images (plain radiographs) as part of my medical decision making, as well as reviewing the written report by the radiologist.  ED MD interpretation:  No PNA   Official radiology report(s): DG Chest 2 View  Result Date: 05/16/2020 CLINICAL DATA:  Chest pain EXAM: CHEST - 2 VIEW COMPARISON:  09/18/2012 FINDINGS: The heart size and mediastinal contours are within normal limits. Both lungs are clear. The visualized skeletal structures are unremarkable. IMPRESSION: No active cardiopulmonary disease. Electronically Signed   By: Davina Poke D.O.   On: 05/16/2020 15:33    ____________________________________________   PROCEDURES  Procedure(s) performed (including Critical Care):  .1-3 Lead EKG Interpretation Performed by: Vanessa Dorado, MD Authorized by: Vanessa Riverbend, MD     Interpretation: normal     ECG rate:  70s    ECG rate assessment: normal     Rhythm: sinus rhythm     Ectopy: none     Conduction: normal       ____________________________________________   INITIAL IMPRESSION / ASSESSMENT AND PLAN / ED COURSE  MERTIS MOSHER was evaluated in Emergency Department on 05/16/2020 for the symptoms described in the history of present illness. She was evaluated in the context of the global COVID-19 pandemic,  which necessitated consideration that the patient might be at risk for infection with the SARS-CoV-2 virus that causes COVID-19. Institutional protocols and algorithms that pertain to the evaluation of patients at risk for COVID-19 are in a state of rapid change  based on information released by regulatory bodies including the CDC and federal and state organizations. These policies and algorithms were followed during the patient's care in the ED.    Patient is a 60 year old who comes in with right upper quadrant pain that radiates up into her chest and around her back.  I suspect that this is most likely related to her gallbladder.  Will get ultrasound to evaluate for cholecystitis and labs to evaluate for choledocholithiasis, cholangitis.  Will get cardiac markers to evaluate for ACS, chest x-ray to make sure comes in pneumonia or pneumothorax.  Patient is on estrogen but she denies any shortness of breath I have lower suspicion for PE at this time.  Patient be kept on the cardiac monitor and given some IV Dilaudid and IV Zofran while pending work-up  Patient feeling better after the IV Dilaudid.  Cardiac markers negative unlikely ACS but her liver function tests are elevated her lipase is elevated consistent with gallstone pancreatitis.  Her ultrasound shows enlarged CBD.  Discussed with Dr. Allen Norris who recommended MRCP.  This was ordered.  Given the doctor will see her patient will not be transferred to stay in the hospital here therefore will discuss with the hospital team for admission.       ____________________________________________   FINAL CLINICAL IMPRESSION(S) / ED DIAGNOSES   Final diagnoses:  RUQ pain  Gallstone pancreatitis      MEDICATIONS GIVEN DURING THIS VISIT:  Medications  HYDROmorphone (DILAUDID) injection 0.5 mg (0.5 mg Intravenous Given 05/16/20 1544)  ondansetron (ZOFRAN) injection 4 mg (4 mg Intravenous Given 05/16/20 1545)     ED Discharge Orders    None        Note:  This document was prepared using Dragon voice recognition software and may include unintentional dictation errors.   Vanessa Lakeland, MD 05/16/20 1754

## 2020-05-16 NOTE — H&P (Signed)
History and Physical   [name] LVD:[id number] DOB: 04/30/1960 DOA: 05/16/2020  Referring MD/NP/PA: Dr. Jari Pigg  PCP: Jerrol Banana., MD   Outpatient Specialists: Dr Roselyn Reef  Patient coming from: Home  Chief Complaint: Abdominal pain  HPI: Ashley Contreras is a 60 y.o. female with medical history significant of history of gallstones, GERD, fatty liver, asthma, shingles who presented with a right upper quadrant abdominal pain rated as 10 out of 10 associated with nausea and vomiting.  Symptoms started late yesterday but got worse today.  Since arriving the ER the pain is worse.  Pain is worsened with any activity.  Is also worse with eating.  Denied any fever or chills.  Denied any hematemesis no melena no bright red blood per rectum.  Patient was seen in the ER and evaluated.  She was noted to have lipase of more than 600.  She also has significant abdominal tenderness.  Pain is radiating to the back as well as to the chest cavity patient being admitted with acute gallstone pancreatitis..  ED Course: Temperature 98.5, blood pressure 1 5578, pulse 88 respirate 22 and oxygen sat 96% on room air.  White count 10.0 hemoglobin 15.3 and platelets 217.  Chemistry and telemetry normal except lipase 624, alkaline phosphatase 106, AST 196 ALT 268.  COVID-19 is negative.  Chest x-ray is negative.  Right upper quad abdominal ultrasound shows CBD dilatation.  No evidence of cholelithiasis or gallbladder wall thickening.  CT abdomen pelvis shows acute interstitial pancreatitis involving the uncinate process and head of the pancreas.  There is mild prominence of the intra and extrahepatic biliary tree with abrupt narrowing at the ampulla with peribiliary enhancement most notably involving the pancreatic portions of the common bile duct and at the ampulla.  No evidence of choledocholithiasis but suggestion for repeat ERCP.  GI consulted and patient being admitted for further work-up.  Review of Systems: As  per HPI otherwise 10 point review of systems negative.    Past Medical History:  Diagnosis Date  . Abnormal Pap smear 1989   s/p colposcopy  . Allergy    hay fever  . Arthritis   . Asthma   . BRCA negative 10/2018   MyRisk neg except ATM VUS  . Chicken pox   . Colon polyp   . Complication of anesthesia   . Family history of adverse reaction to anesthesia    sister - slow to wake  . Family history of breast cancer   . Fatty liver    seen by hepatology  . GERD (gastroesophageal reflux disease)   . Increased risk of breast cancer 10/2018   IBIS=24.7%/riskscore=15.8%  . Mitral valve prolapse   . PONV (postoperative nausea and vomiting)   . Shingles    waist, right side  . UTI (lower urinary tract infection)   . Wears contact lenses     Past Surgical History:  Procedure Laterality Date  . ABDOMINAL HYSTERECTOMY  2008  . BREAST BIOPSY Left    benign  . calcaneous osteotomy  06/2011  . COLONOSCOPY WITH PROPOFOL N/A 02/17/2017   Procedure: COLONOSCOPY WITH PROPOFOL;  Surgeon: Lucilla Lame, MD;  Location: [place];  Service: Endoscopy;  Laterality: N/A;  . ESOPHAGOGASTRODUODENOSCOPY (EGD) WITH PROPOFOL N/A 02/17/2017   Procedure: ESOPHAGOGASTRODUODENOSCOPY (EGD) WITH PROPOFOL;  Surgeon: Lucilla Lame, MD;  Location: [place];  Service: Endoscopy;  Laterality: N/A;  . foot and ankle surgery    . OOPHORECTOMY    . TONSILLECTOMY  AND ADENOIDECTOMY  2008     reports that she has never smoked. She has never used smokeless tobacco. She reports that she does not drink alcohol and does not use drugs.  Allergies  Allergen Reactions  . Penicillins Swelling  . Cinnamon Swelling    Mouth and tongue become raw Swelling of tongue Swelling of tongue Mouth and tongue become raw     Family History  Problem Relation Age of Onset  . Arthritis Mother   . Hypertension Mother   . Mental illness Mother   . Diabetes Mother   . Breast cancer Mother 30       right  breast  . Hyperlipidemia Father   . Heart disease Father   . Cancer Father        lung  . Mental illness Sister   . Myasthenia gravis Sister   . Multiple sclerosis Sister   . Multiple sclerosis Other   . Stroke Maternal Aunt   . Breast cancer Maternal Aunt 70       right breast  . Stroke Maternal Grandmother   . Hypertension Maternal Grandmother   . Diabetes Maternal Grandmother   . Breast cancer Maternal Aunt 72       right breast     Prior to Admission medications   Medication Sig Start Date End Date Taking? Authorizing Provider  estradiol (VIVELLE-DOT) 0.0375 MG/24HR PLACE 1 PATCH ONTO THE SKIN 2 TIMES A WEEK. 0/35/46  Yes Copland, Alicia B, PA-C  fluticasone (FLONASE) 50 MCG/ACT nasal spray INSTILL 2 SPRAYS INTO BOTH NOSTRILS DAILY 09/23/19  Yes Jerrol Banana., MD  loratadine (CLARITIN) 10 MG tablet Take 10 mg by mouth daily.   Yes [provider]  Multiple Vitamins-Minerals (PRESERVISION AREDS 2 PO) Take by mouth 2 (two) times daily.   Yes [provider]  pantoprazole (PROTONIX) 40 MG tablet TAKE 1 TABLET BY MOUTH 2 TIMES DAILY. Patient taking differently: Take 40 mg by mouth 2 (two) times daily. 10/17/19  Yes Jerrol Banana., MD  acetaminophen (TYLENOL) 500 MG tablet Take 500 mg by mouth every 4 (four) hours as needed.    [provider]  calcium-vitamin D (OSCAL WITH D) 500-200 MG-UNIT TABS tablet Take by mouth. Patient not taking: No sig reported    [provider]  hydrocortisone-pramoxine [facility]) 2.5-1 % rectal cream Place rectally 3 (three) times daily. Prn sx 5/68/12   Copland, Elmo Putt B, PA-C  sucralfate (CARAFATE) 1 g tablet TAKE 1 TABLET BY MOUTH 4 TIMES DAILY - WITH MEALS AND AT BEDTIME. Patient taking differently: Take 1 g by mouth 4 (four) times daily. 02/12/20   Jerrol Banana., MD  triamcinolone cream (KENALOG) 0.1 % Apply 1-2 times daily to affected areas as needed for itch. Avoid applying to face,  groin, and axilla. Use as directed. Risk of skin atrophy with long-term use reviewed. 10/29/19   Brendolyn Patty, MD  valACYclovir (VALTREX) 1000 MG tablet TAKE 2 TABLETS AT THE ONSET OF A FEVER BLISTER AND THEN TAKE 2 TABLETS 12 HOURS LATER. AS NEEDED Patient taking differently: Take 2,000 mg by mouth 2 (two) times daily. Take 2 tablets at the onset of a fever blister and then take 2 tablets 12 hours later. As needed 12/23/19   Jerrol Banana., MD    Physical Exam: Vitals:   05/16/20 1600 05/16/20 1630 05/16/20 1700 05/16/20 1730  BP: 139/75 125/71 136/76 (!) 141/77  Pulse: 71 80 70 75  Resp: 14 19  13 16  Temp:      TempSrc:      SpO2: 100% 100% 99% 99%  Weight:      Height:          Constitutional: Acutely ill looking in distress Vitals:   05/16/20 1600 05/16/20 1630 05/16/20 1700 05/16/20 1730  BP: 139/75 125/71 136/76 (!) 141/77  Pulse: 71 80 70 75  Resp: 14 19 13 16   Temp:      TempSrc:      SpO2: 100% 100% 99% 99%  Weight:      Height:       Eyes: PERRL, lids and conjunctivae normal ENMT: Mucous membranes are moist. Posterior pharynx clear of any exudate or lesions.Normal dentition.  Neck: normal, supple, no masses, no thyromegaly Respiratory: clear to auscultation bilaterally, no wheezing, no crackles. Normal respiratory effort. No accessory muscle use.  Cardiovascular: Regular rate and rhythm, no murmurs / rubs / gallops. No extremity edema. 2+ pedal pulses. No carotid bruits.  Abdomen: Diffuse abdominal tenderness worse in the right upper quadrant, no masses palpated. No hepatosplenomegaly. Bowel sounds positive.  Musculoskeletal: no clubbing / cyanosis. No joint deformity upper and lower extremities. Good ROM, no contractures. Normal muscle tone.  Skin: no rashes, lesions, ulcers. No induration Neurologic: CN 2-12 grossly intact. Sensation intact, DTR normal. Strength 5/5 in all 4.  Psychiatric: Normal judgment and insight. Alert and oriented x 3. Normal mood.      Labs on Admission: I have personally reviewed following labs and imaging studies  CBC: Recent Labs  Lab 05/16/20 1445  WBC 10.0  HGB 15.3*  HCT 44.9  MCV 95.1  PLT 563   Basic Metabolic Panel: Recent Labs  Lab 05/16/20 1445  NA 136  K 3.9  CL 105  CO2 23  GLUCOSE 114*  BUN 16  CREATININE 0.59  CALCIUM 9.0   GFR: Estimated Creatinine Clearance: 70.9 mL/min (by C-G formula based on SCr of 0.59 mg/dL). Liver Function Tests: Recent Labs  Lab 05/16/20 1445  AST 196*  ALT 268*  ALKPHOS 106  BILITOT 1.5*  PROT 7.1  ALBUMIN 4.3   Recent Labs  Lab 05/16/20 1445  LIPASE 624*   No results for input(s): AMMONIA in the last 168 hours. Coagulation Profile: No results for input(s): INR, PROTIME in the last 168 hours. Cardiac Enzymes: No results for input(s): CKTOTAL, CKMB, CKMBINDEX, TROPONINI in the last 168 hours. BNP (last 3 results) No results for input(s): PROBNP in the last 8760 hours. HbA1C: No results for input(s): HGBA1C in the last 72 hours. CBG: No results for input(s): GLUCAP in the last 168 hours. Lipid Profile: No results for input(s): CHOL, HDL, LDLCALC, TRIG, CHOLHDL, LDLDIRECT in the last 72 hours. Thyroid Function Tests: No results for input(s): TSH, T4TOTAL, FREET4, T3FREE, THYROIDAB in the last 72 hours. Anemia Panel: No results for input(s): VITAMINB12, FOLATE, FERRITIN, TIBC, IRON, RETICCTPCT in the last 72 hours. Urine analysis:    Component Value Date/Time   COLORURINE YELLOW 12/05/2013 0911   APPEARANCEUR CLEAR 12/05/2013 0911   APPEARANCEUR Clear 09/18/2012 1010   LABSPEC >=1.030 (A) 12/05/2013 0911   LABSPEC 1.017 09/18/2012 1010   PHURINE 5.5 12/05/2013 0911   GLUCOSEU negative 06/28/2018 1138   GLUCOSEU NEGATIVE 12/05/2013 0911   HGBUR SMALL (A) 12/05/2013 0911   BILIRUBINUR negative 07/04/2019 1210   BILIRUBINUR Negative 09/18/2012 1010   KETONESUR NEGATIVE 12/05/2013 0911   PROTEINUR Negative 07/04/2019 1210    PROTEINUR NEG 09/30/2013 1619   UROBILINOGEN 0.2  07/04/2019 1210   UROBILINOGEN 0.2 12/05/2013 0911   NITRITE negative 07/04/2019 1210   NITRITE NEGATIVE 12/05/2013 0911   LEUKOCYTESUR Negative 07/04/2019 1210   LEUKOCYTESUR Negative 09/18/2012 1010   Sepsis Labs: $[email or web address]'[id number]$ (procalcitonin:4,lacticidven:4) )No results found for this or any previous visit (from the past 240 hour(s)).   Radiological Exams on Admission: DG Chest 2 View  Result Date: 05/16/2020 CLINICAL DATA:  Chest pain EXAM: CHEST - 2 VIEW COMPARISON:  09/18/2012 FINDINGS: The heart size and mediastinal contours are within normal limits. Both lungs are clear. The visualized skeletal structures are unremarkable. IMPRESSION: No active cardiopulmonary disease. Electronically Signed   By: Davina Poke D.O.   On: 05/16/2020 15:33   US ABDOMEN LIMITED RUQ (LIVER/GB)  Result Date: 05/16/2020 CLINICAL DATA:  60 year old female with RIGHT UPPER quadrant abdominal pain for 1 day. EXAM: ULTRASOUND ABDOMEN LIMITED RIGHT UPPER QUADRANT COMPARISON:  02/12/2016 CT and 02/25/2015 ultrasound FINDINGS: Gallbladder: The gallbladder is unremarkable. There is no evidence of cholelithiasis, gallbladder wall thickening or sonographic Murphy sign. A trace amount of pericholecystic fluid is noted. Common bile duct: Diameter: 9 mm-distended. The LOWER CBD is not visualized. No intrahepatic biliary dilatation identified. Liver: No focal lesion identified. Within normal limits in parenchymal echogenicity. Portal vein is patent on color Doppler imaging with normal direction of blood flow towards the liver. Other: None. IMPRESSION: 1. CBD dilatation - CBD obstruction is not excluded. An obstructing cause is not identified, but the LOWER CBD is not visualized. 2. No evidence of cholelithiasis or gallbladder wall thickening. 3. Unremarkable liver. Electronically Signed   By: Margarette Canada M.D.   On: 05/16/2020 16:20      Assessment/Plan Principal Problem:    Acute gallstone pancreatitis Active Problems:   GERD (gastroesophageal reflux disease)   Chronic gastritis     #1 acute gallstone pancreatitis: Patient is still having significant pain.  Lipase is more than 608 624.  At this point we will admit the patient.  Complete bowel rest.  Pain management.  Initiate IV LR with D5.  Based on the MRI findings patient will benefit from ERCP and GI consulted.  She will ultimately needs cholecystectomy after resolution of acute symptoms.  #2 GERD: Continue PPI  #3 chronic gastritis: Stable.  Continue PPIs   DVT prophylaxis: Lovenox Code Status: Full code Family Communication: Husband at bedside Disposition Plan: Home Consults called: Dr. Roselyn Reef, gastroenterology Admission status: Inpatient  Severity of Illness: The appropriate patient status for this patient is INPATIENT. Inpatient status is judged to be reasonable and necessary in order to provide the required intensity of service to ensure the patient's safety. The patient's presenting symptoms, physical exam findings, and initial radiographic and laboratory data in the context of their chronic comorbidities is felt to place them at high risk for further clinical deterioration. Furthermore, it is not anticipated that the patient will be medically stable for discharge from the hospital within 2 midnights of admission. The following factors support the patient status of inpatient.   " The patient's presenting symptoms include abdominal pain, nausea vomiting. " The worrisome physical exam findings include mild diffuse abdominal tenderness. " The initial radiographic and laboratory data are worrisome because of increased lipase and CT findings of pancreatitis. " The chronic co-morbidities include gallstones.   * I certify that at the point of admission it is my clinical judgment that the patient will require inpatient hospital care spanning beyond 2 midnights from the point of admission due to high  intensity of service, high risk  for further deterioration and high frequency of surveillance required.Barbette Merino MD [facility] Pager [phone]  If 7PM-7AM, please contact night-coverage [email or web address] Password [facility]  05/16/2020, 7:15 PM

## 2020-05-16 NOTE — ED Notes (Signed)
Called inpt unit to verify visitor policy, reported same hospital wide 8a-8p, daughter and pt aware of same. Daughter to leave pt phone, glasses and clothing. Pt talking to MRI currently for screening

## 2020-05-17 ENCOUNTER — Inpatient Hospital Stay: Payer: 59 | Admitting: Anesthesiology

## 2020-05-17 ENCOUNTER — Encounter
Admission: EM | Disposition: A | Discharge status: Home / Self Care | Payer: Self-pay | Source: Home / Self Care | Attending: Internal Medicine

## 2020-05-17 ENCOUNTER — Encounter: Payer: Self-pay | Admitting: Internal Medicine

## 2020-05-17 ENCOUNTER — Inpatient Hospital Stay: Payer: 59

## 2020-05-17 DIAGNOSIS — K839 Disease of biliary tract, unspecified: Secondary | ICD-10-CM | POA: Diagnosis not present

## 2020-05-17 DIAGNOSIS — R748 Abnormal levels of other serum enzymes: Secondary | ICD-10-CM | POA: Diagnosis not present

## 2020-05-17 DIAGNOSIS — K851 Biliary acute pancreatitis without necrosis or infection: Principal | ICD-10-CM

## 2020-05-17 DIAGNOSIS — K8500 Idiopathic acute pancreatitis without necrosis or infection: Secondary | ICD-10-CM | POA: Diagnosis not present

## 2020-05-17 DIAGNOSIS — R17 Unspecified jaundice: Secondary | ICD-10-CM

## 2020-05-17 HISTORY — PX: ENDOSCOPIC RETROGRADE CHOLANGIOPANCREATOGRAPHY (ERCP) WITH PROPOFOL: SHX5810

## 2020-05-17 LAB — LIPID PANEL
Cholesterol: 208 mg/dL — ABNORMAL HIGH (ref 0–200)
HDL: 76 mg/dL (ref >40)
LDL Cholesterol: 118 mg/dL — ABNORMAL HIGH (ref 0–99)
Total CHOL/HDL Ratio: 2.7 RATIO
Triglycerides: 71 mg/dL (ref <150)
VLDL: 14 mg/dL (ref 0–40)

## 2020-05-17 LAB — CBC
HCT: 46.1 % — ABNORMAL HIGH (ref 36.0–46.0)
Hemoglobin: 15.7 g/dL — ABNORMAL HIGH (ref 12.0–15.0)
MCH: 32 pg (ref 26.0–34.0)
MCHC: 34.1 g/dL (ref 30.0–36.0)
MCV: 93.9 fL (ref 80.0–100.0)
Platelets: 217 10*3/uL (ref 150–400)
RBC: 4.91 MIL/uL (ref 3.87–5.11)
RDW: 12.9 % (ref 11.5–15.5)
WBC: 17.9 10*3/uL — ABNORMAL HIGH (ref 4.0–10.5)
nRBC: 0 % (ref 0.0–0.2)

## 2020-05-17 LAB — COMPREHENSIVE METABOLIC PANEL
ALT: 856 U/L — ABNORMAL HIGH (ref 0–44)
AST: 1105 U/L — ABNORMAL HIGH (ref 15–41)
Albumin: 4.2 g/dL (ref 3.5–5.0)
Alkaline Phosphatase: 180 U/L — ABNORMAL HIGH (ref 38–126)
Anion gap: 10 (ref 5–15)
BUN: 11 mg/dL (ref 6–20)
CO2: 22 mmol/L (ref 22–32)
Calcium: 8.8 mg/dL — ABNORMAL LOW (ref 8.9–10.3)
Chloride: 106 mmol/L (ref 98–111)
Creatinine, Ser: 0.45 mg/dL (ref 0.44–1.00)
GFR, Estimated: >60 mL/min (ref >60)
Glucose, Bld: 291 mg/dL — ABNORMAL HIGH (ref 70–99)
Potassium: 3.8 mmol/L (ref 3.5–5.1)
Sodium: 138 mmol/L (ref 135–145)
Total Bilirubin: 7.5 mg/dL — ABNORMAL HIGH (ref 0.3–1.2)
Total Protein: 7 g/dL (ref 6.5–8.1)

## 2020-05-17 LAB — ACETAMINOPHEN LEVEL: Acetaminophen (Tylenol), Serum: <10 ug/mL — ABNORMAL LOW (ref 10–30)

## 2020-05-17 LAB — HEMOGLOBIN A1C
Hgb A1c MFr Bld: 5.3 % (ref 4.8–5.6)
Mean Plasma Glucose: 105.41 mg/dL

## 2020-05-17 LAB — LIPASE, BLOOD: Lipase: 271 U/L — ABNORMAL HIGH (ref 11–51)

## 2020-05-17 LAB — HEPATITIS PANEL, ACUTE
HCV Ab: NONREACTIVE
Hep A IgM: NONREACTIVE
Hep B C IgM: NONREACTIVE
Hepatitis B Surface Ag: NONREACTIVE

## 2020-05-17 LAB — GLUCOSE, CAPILLARY
Glucose-Capillary: 112 mg/dL — ABNORMAL HIGH (ref 70–99)
Glucose-Capillary: 159 mg/dL — ABNORMAL HIGH (ref 70–99)

## 2020-05-17 LAB — HIV ANTIBODY (ROUTINE TESTING W REFLEX): HIV Screen 4th Generation wRfx: NONREACTIVE

## 2020-05-17 SURGERY — ENDOSCOPIC RETROGRADE CHOLANGIOPANCREATOGRAPHY (ERCP) WITH PROPOFOL
Anesthesia: General

## 2020-05-17 SURGERY — Surgical Case
Anesthesia: *Unknown

## 2020-05-17 MED ORDER — SODIUM CHLORIDE 0.9 % IV BOLUS
1000.0000 mL | Freq: Once | INTRAVENOUS | Status: AC
  Administered 2020-05-17: 1000 mL via INTRAVENOUS

## 2020-05-17 MED ORDER — LACTATED RINGERS IV SOLN: INTRAVENOUS | Status: DC

## 2020-05-17 MED ORDER — PROPOFOL 500 MG/50ML IV EMUL
INTRAVENOUS | Status: DC | PRN
  Administered 2020-05-17: 125 ug/kg/min via INTRAVENOUS
  Administered 2020-05-17: 50 ug/kg/min via INTRAVENOUS

## 2020-05-17 MED ORDER — INDOCYANINE GREEN 25 MG IV SOLR
2.5000 mg | Freq: Once | INTRAVENOUS | Status: AC
  Administered 2020-05-18: 2.5 mg via INTRAVENOUS
  Filled 2020-05-17: qty 1

## 2020-05-17 MED ORDER — SODIUM CHLORIDE 0.9 % IV SOLN
1.0000 g | Freq: Three times a day (TID) | INTRAVENOUS | Status: DC
  Administered 2020-05-17 – 2020-05-19 (×5): 1 g via INTRAVENOUS
  Filled 2020-05-17 (×8): qty 1

## 2020-05-17 MED ORDER — PANTOPRAZOLE SODIUM 40 MG IV SOLR
40.0000 mg | Freq: Two times a day (BID) | INTRAVENOUS | Status: DC
  Administered 2020-05-17 – 2020-05-19 (×4): 40 mg via INTRAVENOUS
  Filled 2020-05-17 (×4): qty 40

## 2020-05-17 MED ORDER — INDOMETHACIN 50 MG RE SUPP
RECTAL | Status: AC
  Administered 2020-05-17: 100 mg via RECTAL
  Filled 2020-05-17: qty 2

## 2020-05-17 MED ORDER — POLYVINYL ALCOHOL 1.4 % OP SOLN
1.0000 [drp] | OPHTHALMIC | Status: DC | PRN
  Administered 2020-05-17: 1 [drp] via OPHTHALMIC
  Filled 2020-05-17 (×2): qty 15

## 2020-05-17 MED ORDER — IBUPROFEN 400 MG PO TABS
400.0000 mg | ORAL_TABLET | Freq: Four times a day (QID) | ORAL | Status: DC | PRN
  Administered 2020-05-17: 400 mg via ORAL
  Filled 2020-05-17: qty 1

## 2020-05-17 MED ORDER — PROPOFOL 10 MG/ML IV BOLUS
INTRAVENOUS | Status: AC
  Filled 2020-05-17: qty 20

## 2020-05-17 MED ORDER — INDOMETHACIN 50 MG RE SUPP
100.0000 mg | Freq: Once | RECTAL | Status: AC
  Filled 2020-05-17: qty 2

## 2020-05-17 NOTE — Progress Notes (Addendum)
PROGRESS NOTE    Ashley Contreras  POE:[id number] DOB: Jul 10, 1960 DOA: 05/16/2020 PCP: Jerrol Banana., MD   Brief Narrative: 60 year old with past medical history significant for gallstone, GERD, fatty liver, asthma, shingles who presents to the ED complaining of right upper quadrant abdominal pain, 10 out of 10, associated with nausea and vomiting.  Symptoms started the day prior to admission but got worse the day of admission.  Abdominal pain is exacerbated by activity and after meals.  Patient was found to have gallstone cryptitis, lipase more than 600, transaminases alkaline phosphatase 106, AST 196, ALT 268.  Chest x-ray negative.  Right upper quadrant abdominal ultrasound showed CBD dilation.  No evidence of cholelithiasis of gallbladder wall thickening.  MRCP: Showed acute interstitial pancreatitis, mild prominence of the intra and extrahepatic biliary tree with abrupt narrowing of the ampulla with peribiliary enhancement, most notably involving the pancreatic portion of the common bile duct and the ampulla.  No evidence of choledocholithiasis but suggestion for ERCP to better evaluate.     Assessment & Plan:   Principal Problem:   Acute gallstone pancreatitis Active Problems:   GERD (gastroesophageal reflux disease)   Chronic gastritis   1-Acute Pancreatitis: ? Related to pass gallstone - MRCP showed mild prominence of  the intra and extrahepatic biliary tree with abrupt narrowing of the ampulla with peribiliary enhancement, most notably involving the pancreatic portion of the common bile duct and the ampulla. No choledocholithiasis, will need ERCP to further evaluate.  -Continue with IV fluids. Will give IV bolus. Increase IV fluids rate.  -[place]; CBD dilation, CBD obstruction is not excluded.  An obstructing gauze is not a 35, but the lower CBD is not visualized.  No evidence of cholelithiasis or gallbladder wall thickening. -LFT increasing, might need ERCP.  -Check lipase  and lipid panel.  -Increase IV fluids.  -IV Protonix.  Added IV antibiotics. -Discussed with Dr. Allen Norris, who  recommended surgery consultation for evaluation of cholecystectomy. -Check blood cultures. -GI will proceed with ERCP and possible stent placement no plan for sphincterotomy.   2-Transaminases;  Suspect obstructive etiology.  GI consulted.  Check Viral hepatitis panel.  LFT worse this morning.   3-Leukocytosis:  Could be reactive, but will cover for intra abdominal process, cholangitis in setting transaminases.  Start IV Meropenem. (Patient has pennincilin allergies)  4-Hyperglycemia:  Change IV fluids to LR.  Check HbA1c. 5.3 Monitor CBG With hyperglycemia related to IV fluids, and acute pancreatitis  Estimated body mass index is 23.4 kg/m as calculated from the following:   Height as of this encounter: 5\' 6"  (1.676 m).   Weight as of this encounter: 65.8 kg.   DVT prophylaxis: Lovenox Code Status: Full code Family Communication: Husband who was at bedside Disposition Plan:  Status is: Inpatient  Remains inpatient appropriate because:IV treatments appropriate due to intensity of illness or inability to take PO   Dispo: The patient is from: Home              Anticipated d/c is to: Home              Patient currently is not medically stable to d/c.   Difficult to place patient No        Consultants:   GI, Dr Allen Norris   Procedures:     Antimicrobials:  Zosyn 3/27  Subjective: She is complaining of abdominal pain, notice improvement of abdominal pain.  Yesterday abdominal pain was very severe. Abdominal pain radiates to upper back  Objective: Vitals:   05/16/20 1931 05/16/20 2121 05/17/20 0009 05/17/20 0507  BP: (!) 155/57 (!) 155/78 (!) 162/85 (!) 142/79  Pulse: 88 75 83 88  Resp: 16 20 20 16   Temp:   97.9 F (36.6 C) 97.9 F (36.6 C)  TempSrc:    Oral  SpO2: 98% 100% 98% 95%  Weight:      Height:        Intake/Output Summary (Last 24  hours) at 05/17/2020 0746 Last data filed at 05/17/2020 0400 Gross per 24 hour  Intake 792.54 ml  Output 500 ml  Net 292.54 ml   Filed Weights   05/16/20 1437  Weight: 65.8 kg    Examination:  General exam: Appears calm and comfortable  Respiratory system: Clear to auscultation. Respiratory effort normal. Cardiovascular system: S1 & S2 heard, RRR. No JVD, murmurs, rubs, gallops or clicks. No pedal edema. Gastrointestinal system: Abdomen is soft, mild tenderness, no rigidity Central nervous system: Alert and oriented. . Extremities: no edema   Data Reviewed: I have personally reviewed following labs and imaging studies  CBC: Recent Labs  Lab 05/16/20 1445 05/17/20 0525  WBC 10.0 17.9*  HGB 15.3* 15.7*  HCT 44.9 46.1*  MCV 95.1 93.9  PLT 217 798   Basic Metabolic Panel: Recent Labs  Lab 05/16/20 1445 05/17/20 0525  NA 136 138  K 3.9 3.8  CL 105 106  CO2 23 22  GLUCOSE 114* 291*  BUN 16 11  CREATININE 0.59 0.45  CALCIUM 9.0 8.8*   GFR: Estimated Creatinine Clearance: 70.9 mL/min (by C-G formula based on SCr of 0.45 mg/dL). Liver Function Tests: Recent Labs  Lab 05/16/20 1445 05/17/20 0525  AST 196* 1,105*  ALT 268* 856*  ALKPHOS 106 180*  BILITOT 1.5* 7.5*  PROT 7.1 7.0  ALBUMIN 4.3 4.2   Recent Labs  Lab 05/16/20 1445  LIPASE 624*   No results for input(s): AMMONIA in the last 168 hours. Coagulation Profile: No results for input(s): INR, PROTIME in the last 168 hours. Cardiac Enzymes: No results for input(s): CKTOTAL, CKMB, CKMBINDEX, TROPONINI in the last 168 hours. BNP (last 3 results) No results for input(s): PROBNP in the last 8760 hours. HbA1C: No results for input(s): HGBA1C in the last 72 hours. CBG: No results for input(s): GLUCAP in the last 168 hours. Lipid Profile: No results for input(s): CHOL, HDL, LDLCALC, TRIG, CHOLHDL, LDLDIRECT in the last 72 hours. Thyroid Function Tests: No results for input(s): TSH, T4TOTAL, FREET4,  T3FREE, THYROIDAB in the last 72 hours. Anemia Panel: No results for input(s): VITAMINB12, FOLATE, FERRITIN, TIBC, IRON, RETICCTPCT in the last 72 hours. Sepsis Labs: No results for input(s): PROCALCITON, LATICACIDVEN in the last 168 hours.  Recent Results (from the past 240 hour(s))  Resp Panel by RT-PCR (Flu A&B, Covid) Nasopharyngeal Swab     Status: None   Collection Time: 05/16/20  6:23 PM   Specimen: Nasopharyngeal Swab; Nasopharyngeal(NP) swabs in vial transport medium  Result Value Ref Range Status   SARS Coronavirus 2 by RT PCR NEGATIVE NEGATIVE Final    Comment: (NOTE) SARS-CoV-2 target nucleic acids are NOT DETECTED.  The SARS-CoV-2 RNA is generally detectable in upper respiratory specimens during the acute phase of infection. The lowest concentration of SARS-CoV-2 viral copies this assay can detect is 138 copies/mL. A negative result does not preclude SARS-Cov-2 infection and should not be used as the sole basis for treatment or other patient management decisions. A negative result may occur with  improper specimen  collection/handling, submission of specimen other than nasopharyngeal swab, presence of viral mutation(s) within the areas targeted by this assay, and inadequate number of viral copies(<138 copies/mL). A negative result must be combined with clinical observations, patient history, and epidemiological information. The expected result is Negative.  Fact Sheet for Patients:  EntrepreneurPulse.com.au  Fact Sheet for Healthcare Providers:  IncredibleEmployment.be  This test is no t yet approved or cleared by the [place] FDA and  has been authorized for detection and/or diagnosis of SARS-CoV-2 by FDA under an Emergency Use Authorization (EUA). This EUA will remain  in effect (meaning this test can be used) for the duration of the COVID-19 declaration under Section 564(b)(1) of the Act, 21 U.S.C.section 360bbb-3(b)(1),  unless the authorization is terminated  or revoked sooner.       Influenza A by PCR NEGATIVE NEGATIVE Final   Influenza B by PCR NEGATIVE NEGATIVE Final    Comment: (NOTE) The Xpert Xpress SARS-CoV-2/FLU/RSV plus assay is intended as an aid in the diagnosis of influenza from Nasopharyngeal swab specimens and should not be used as a sole basis for treatment. Nasal washings and aspirates are unacceptable for Xpert Xpress SARS-CoV-2/FLU/RSV testing.  Fact Sheet for Patients: EntrepreneurPulse.com.au  Fact Sheet for Healthcare Providers: IncredibleEmployment.be  This test is not yet approved or cleared by the [place] FDA and has been authorized for detection and/or diagnosis of SARS-CoV-2 by FDA under an Emergency Use Authorization (EUA). This EUA will remain in effect (meaning this test can be used) for the duration of the COVID-19 declaration under Section 564(b)(1) of the Act, 21 U.S.C. section 360bbb-3(b)(1), unless the authorization is terminated or revoked.  Performed at [facility], [id number], [place], [place] [address]          Radiology Studies: DG Chest 2 View  Result Date: 05/16/2020 CLINICAL DATA:  Chest pain EXAM: CHEST - 2 VIEW COMPARISON:  09/18/2012 FINDINGS: The heart size and mediastinal contours are within normal limits. Both lungs are clear. The visualized skeletal structures are unremarkable. IMPRESSION: No active cardiopulmonary disease. Electronically Signed   By: Davina Poke D.O.   On: 05/16/2020 15:33   MR 3D Recon At Scanner  Result Date: 05/16/2020 CLINICAL DATA:  Concern for choledocholithiasis. EXAM: MRI ABDOMEN WITHOUT AND WITH CONTRAST (INCLUDING MRCP) TECHNIQUE: Multiplanar multisequence MR imaging of the abdomen was performed both before and after the administration of intravenous contrast. Heavily T2-weighted images of the biliary and pancreatic ducts were obtained, and  three-dimensional MRCP images were rendered by post processing. CONTRAST:  7.48mL GADAVIST GADOBUTROL 1 MMOL/ML IV SOLN COMPARISON:  Same-day right upper quadrant ultrasound. CT abdomen pelvis February 12, 2016. FINDINGS: Lower chest: No acute finding. Hepatobiliary: No hepatic steatosis. Tiny bilobar hepatic cysts the largest of which is in the inferior aspect of the right lobe of the liver measuring 7 mm on image 17/3. No suspicious hepatic hepatic lesions. Gallbladder is dilated with pericholecystic fluid. No cholelithiasis. There is prominence of the intra and extrahepatic biliary tree with the common duct measuring up to 7 mm on image 17/3. With abrupt narrowing at the ampulla. Peribiliary enhancement most notably involving the pancreatic portions of the common bile duct and at the ampulla on image 40/21. No discrete intraluminal filling defect visualized within the bile ducts. Pancreas: Edematous appearance of the uncinate process/head of the pancreas with adjacent peripancreatic stranding, no walled off collection. No pancreatic hypoenhancement. Probable pancreatic divisum. No pancreatic ductal dilation. Spleen:  Within normal limits in size and appearance.  Adrenals/Urinary Tract: Bilateral adrenal glands are unremarkable. Bilateral renal cysts. No suspicious enhancing renal lesions. Stomach/Bowel: Stomach is grossly unremarkable. There is mild hyperenhancement and wall thickening of the second part of duodenum. No evidence of bowel obstruction. Vascular/Lymphatic: No pathologically enlarged lymph nodes identified. No abdominal aortic aneurysm demonstrated. The portal splenic and hepatic veins appear patent. Other:  No walled off fluid collections. Musculoskeletal: No suspicious bone lesions identified. IMPRESSION: 1. Acute interstitial pancreatitis involving the uncinate process and head of the pancreas, no walled off collection or evidence of necrosis. 2. Mild prominence of the intra and extrahepatic biliary  tree with abrupt narrowing at the ampulla with peribiliary enhancement most notably involving the pancreatic portions of the common bile duct and at the ampulla. No choledocholithiasis or discrete enhancing intraluminal filling defect visualized. Findings which may represent sequela of recently passed stone however given the narrowing and enhancement ampullary pathology is also a consideration, this could be further evaluated with ERCP. 3. Dilated gallbladder with pericholecystic fluid findings which are likely reactive. However, acute cholecystitis not completely excluded. 4. Probable pancreatic divisum. Electronically Signed   By: Dahlia Bailiff MD   On: 05/16/2020 21:55   MR ABDOMEN MRCP W WO CONTAST  Result Date: 05/16/2020 CLINICAL DATA:  Concern for choledocholithiasis. EXAM: MRI ABDOMEN WITHOUT AND WITH CONTRAST (INCLUDING MRCP) TECHNIQUE: Multiplanar multisequence MR imaging of the abdomen was performed both before and after the administration of intravenous contrast. Heavily T2-weighted images of the biliary and pancreatic ducts were obtained, and three-dimensional MRCP images were rendered by post processing. CONTRAST:  7.61mL GADAVIST GADOBUTROL 1 MMOL/ML IV SOLN COMPARISON:  Same-day right upper quadrant ultrasound. CT abdomen pelvis February 12, 2016. FINDINGS: Lower chest: No acute finding. Hepatobiliary: No hepatic steatosis. Tiny bilobar hepatic cysts the largest of which is in the inferior aspect of the right lobe of the liver measuring 7 mm on image 17/3. No suspicious hepatic hepatic lesions. Gallbladder is dilated with pericholecystic fluid. No cholelithiasis. There is prominence of the intra and extrahepatic biliary tree with the common duct measuring up to 7 mm on image 17/3. With abrupt narrowing at the ampulla. Peribiliary enhancement most notably involving the pancreatic portions of the common bile duct and at the ampulla on image 40/21. No discrete intraluminal filling defect visualized  within the bile ducts. Pancreas: Edematous appearance of the uncinate process/head of the pancreas with adjacent peripancreatic stranding, no walled off collection. No pancreatic hypoenhancement. Probable pancreatic divisum. No pancreatic ductal dilation. Spleen:  Within normal limits in size and appearance. Adrenals/Urinary Tract: Bilateral adrenal glands are unremarkable. Bilateral renal cysts. No suspicious enhancing renal lesions. Stomach/Bowel: Stomach is grossly unremarkable. There is mild hyperenhancement and wall thickening of the second part of duodenum. No evidence of bowel obstruction. Vascular/Lymphatic: No pathologically enlarged lymph nodes identified. No abdominal aortic aneurysm demonstrated. The portal splenic and hepatic veins appear patent. Other:  No walled off fluid collections. Musculoskeletal: No suspicious bone lesions identified. IMPRESSION: 1. Acute interstitial pancreatitis involving the uncinate process and head of the pancreas, no walled off collection or evidence of necrosis. 2. Mild prominence of the intra and extrahepatic biliary tree with abrupt narrowing at the ampulla with peribiliary enhancement most notably involving the pancreatic portions of the common bile duct and at the ampulla. No choledocholithiasis or discrete enhancing intraluminal filling defect visualized. Findings which may represent sequela of recently passed stone however given the narrowing and enhancement ampullary pathology is also a consideration, this could be further evaluated with ERCP. 3. Dilated gallbladder  with pericholecystic fluid findings which are likely reactive. However, acute cholecystitis not completely excluded. 4. Probable pancreatic divisum. Electronically Signed   By: Dahlia Bailiff MD   On: 05/16/2020 21:55   US ABDOMEN LIMITED RUQ (LIVER/GB)  Result Date: 05/16/2020 CLINICAL DATA:  60 year old female with RIGHT UPPER quadrant abdominal pain for 1 day. EXAM: ULTRASOUND ABDOMEN LIMITED  RIGHT UPPER QUADRANT COMPARISON:  02/12/2016 CT and 02/25/2015 ultrasound FINDINGS: Gallbladder: The gallbladder is unremarkable. There is no evidence of cholelithiasis, gallbladder wall thickening or sonographic Murphy sign. A trace amount of pericholecystic fluid is noted. Common bile duct: Diameter: 9 mm-distended. The LOWER CBD is not visualized. No intrahepatic biliary dilatation identified. Liver: No focal lesion identified. Within normal limits in parenchymal echogenicity. Portal vein is patent on color Doppler imaging with normal direction of blood flow towards the liver. Other: None. IMPRESSION: 1. CBD dilatation - CBD obstruction is not excluded. An obstructing cause is not identified, but the LOWER CBD is not visualized. 2. No evidence of cholelithiasis or gallbladder wall thickening. 3. Unremarkable liver. Electronically Signed   By: Margarette Canada M.D.   On: 05/16/2020 16:20        Scheduled Meds: . enoxaparin (LOVENOX) injection  40 mg Subcutaneous Q24H   Continuous Infusions: . dextrose 5% lactated ringers 125 mL/hr at 05/17/20 0535  . sodium chloride       LOS: 1 day    Time spent: 35 minutes.     Elmarie Shiley, MD [facility]   If 7PM-7AM, please contact night-coverage [email or web address]  05/17/2020, 7:46 AM

## 2020-05-17 NOTE — Consult Note (Signed)
Ashley Lame, MD [facility]  [address], [place], [place] [address] Phone: [phone] Fax : [phone]  Consultation  Referring Provider:     Dr. Jari Pigg Primary Care Physician:  Jerrol Banana., MD Primary Gastroenterologist:  Dr. Allen Norris         Reason for Consultation:     Abnormal liver enzymes  Date of Admission:  05/16/2020 Date of Consultation:  05/17/2020         HPI:   Ashley Contreras is a 60 y.o. female who comes in with abdominal pain.  The patient states she was driving to a funeral and had some abdominal pain that got better than yesterday morning she ate breakfast and it became exceedingly worse.  She denies ever having these symptoms in the past.  The patient came to the emergency room and was found to have elevated AST and ALT with a bilirubin of 1.5.  Overnight the patient's liver enzymes increased and it did trend showed:  Component     Latest Ref Rng & Units 05/16/2020 05/17/2020  Albumin     3.5 - 5.0 g/dL 4.3 4.2  AST     15 - 41 U/L 196 (H) 1,105 (H)  ALT     0 - 44 U/L 268 (H) 856 (H)  Alkaline Phosphatase     38 - 126 U/L 106 180 (H)  Total Bilirubin     0.3 - 1.2 mg/dL 1.5 (H) 7.5 (H)    The patient's lipase was also elevated on admission at 624 and came down to 271 this morning.  The patient reports that her abdominal pain is in the epigastric area and radiates over to the right upper quadrant and into the right shoulder.  The patient had an MRCP that showed:  IMPRESSION: 1. Acute interstitial pancreatitis involving the uncinate process and head of the pancreas, no walled off collection or evidence of necrosis. 2. Mild prominence of the intra and extrahepatic biliary tree with abrupt narrowing at the ampulla with peribiliary enhancement most notably involving the pancreatic portions of the common bile duct and at the ampulla. No choledocholithiasis or discrete enhancing intraluminal filling defect visualized. Findings which may  represent sequela of recently passed stone however given the narrowing and enhancement ampullary pathology is also a consideration, this could be further evaluated with ERCP. 3. Dilated gallbladder with pericholecystic fluid findings which are likely reactive. However, acute cholecystitis not completely excluded. 4. Probable pancreatic divisum.  She denies taking any new medications or any over-the-counter herbs or supplements.  There has been no sick contacts.  Past Medical History:  Diagnosis Date  . Abnormal Pap smear 1989   s/p colposcopy  . Allergy    hay fever  . Arthritis   . Asthma   . BRCA negative 10/2018   MyRisk neg except ATM VUS  . Chicken pox   . Colon polyp   . Complication of anesthesia   . Family history of adverse reaction to anesthesia    sister - slow to wake  . Family history of breast cancer   . Fatty liver    seen by hepatology  . GERD (gastroesophageal reflux disease)   . Increased risk of breast cancer 10/2018   IBIS=24.7%/riskscore=15.8%  . Mitral valve prolapse   . PONV (postoperative nausea and vomiting)   . Shingles    waist, right side  . UTI (lower urinary tract infection)   . Wears contact lenses     Past Surgical  History:  Procedure Laterality Date  . ABDOMINAL HYSTERECTOMY  2008  . BREAST BIOPSY Left    benign  . calcaneous osteotomy  06/2011  . COLONOSCOPY WITH PROPOFOL N/A 02/17/2017   Procedure: COLONOSCOPY WITH PROPOFOL;  Surgeon: Ashley Lame, MD;  Location: [facility];  Service: Endoscopy;  Laterality: N/A;  . ESOPHAGOGASTRODUODENOSCOPY (EGD) WITH PROPOFOL N/A 02/17/2017   Procedure: ESOPHAGOGASTRODUODENOSCOPY (EGD) WITH PROPOFOL;  Surgeon: Ashley Lame, MD;  Location: [facility];  Service: Endoscopy;  Laterality: N/A;  . foot and ankle surgery    . OOPHORECTOMY    . TONSILLECTOMY AND ADENOIDECTOMY  2008    Prior to Admission medications   Medication Sig Start Date End Date Taking? Authorizing Provider   estradiol (VIVELLE-DOT) 0.0375 MG/24HR PLACE 1 PATCH ONTO THE SKIN 2 TIMES A WEEK. 3/53/61  Yes Copland, Alicia B, PA-C  fluticasone (FLONASE) 50 MCG/ACT nasal spray INSTILL 2 SPRAYS INTO BOTH NOSTRILS DAILY 09/23/19  Yes Jerrol Banana., MD  loratadine (CLARITIN) 10 MG tablet Take 10 mg by mouth daily.   Yes [provider]  Multiple Vitamins-Minerals (PRESERVISION AREDS 2 PO) Take by mouth 2 (two) times daily.   Yes [provider]  pantoprazole (PROTONIX) 40 MG tablet TAKE 1 TABLET BY MOUTH 2 TIMES DAILY. Patient taking differently: Take 40 mg by mouth 2 (two) times daily. 10/17/19  Yes Jerrol Banana., MD  acetaminophen (TYLENOL) 500 MG tablet Take 500 mg by mouth every 4 (four) hours as needed.    [provider]  calcium-vitamin D (OSCAL WITH D) 500-200 MG-UNIT TABS tablet Take by mouth. Patient not taking: No sig reported    [provider]  hydrocortisone-pramoxine [facility]) 2.5-1 % rectal cream Place rectally 3 (three) times daily. Prn sx 4/43/15   Copland, Elmo Putt B, PA-C  sucralfate (CARAFATE) 1 g tablet TAKE 1 TABLET BY MOUTH 4 TIMES DAILY - WITH MEALS AND AT BEDTIME. Patient taking differently: Take 1 g by mouth 4 (four) times daily. 02/12/20   Jerrol Banana., MD  triamcinolone cream (KENALOG) 0.1 % Apply 1-2 times daily to affected areas as needed for itch. Avoid applying to face, groin, and axilla. Use as directed. Risk of skin atrophy with long-term use reviewed. 10/29/19   Brendolyn Patty, MD  valACYclovir (VALTREX) 1000 MG tablet TAKE 2 TABLETS AT THE ONSET OF A FEVER BLISTER AND THEN TAKE 2 TABLETS 12 HOURS LATER. AS NEEDED Patient taking differently: Take 2,000 mg by mouth 2 (two) times daily. Take 2 tablets at the onset of a fever blister and then take 2 tablets 12 hours later. As needed 12/23/19   Jerrol Banana., MD    Family History  Problem Relation Age of Onset  . Arthritis Mother   . Hypertension Mother   .  Mental illness Mother   . Diabetes Mother   . Breast cancer Mother 76       right breast  . Hyperlipidemia Father   . Heart disease Father   . Cancer Father        lung  . Mental illness Sister   . Myasthenia gravis Sister   . Multiple sclerosis Sister   . Multiple sclerosis Other   . Stroke Maternal Aunt   . Breast cancer Maternal Aunt 70       right breast  . Stroke Maternal Grandmother   . Hypertension Maternal Grandmother   . Diabetes Maternal Grandmother   . Breast cancer Maternal Aunt 72  right breast     Social History   Tobacco Use  . Smoking status: Never Smoker  . Smokeless tobacco: Never Used  Vaping Use  . Vaping Use: Never used  Substance Use Topics  . Alcohol use: No  . Drug use: No    Allergies as of 05/16/2020 - Review Complete 05/16/2020  Allergen Reaction Noted  . Penicillins Swelling 10/21/2011  . Cinnamon Swelling 01/20/2015    Review of Systems:    All systems reviewed and negative except where noted in HPI.   Physical Exam:  Vital signs in last 24 hours: Temp:  [97.9 F (36.6 C)-98.5 F (36.9 C)] 97.9 F (36.6 C) (03/27 0805) Pulse Rate:  [70-88] 81 (03/27 0805) Resp:  [13-20] 16 (03/27 0805) BP: (125-162)/(57-91) 147/81 (03/27 0805) SpO2:  [95 %-100 %] 97 % (03/27 0805) Weight:  [65.8 kg] 65.8 kg (03/26 1437) Last BM Date: 05/15/20 General:   Pleasant, cooperative in NAD Head:  Normocephalic and atraumatic. Eyes:   Positive icterus.   Conjunctiva yellow. PERRLA. Ears:  Normal auditory acuity. Neck:  Supple; no masses or thyroidomegaly Lungs: Respirations even and unlabored. Lungs clear to auscultation bilaterally.   No wheezes, crackles, or rhonchi.  Heart:  Regular rate and rhythm;  Without murmur, clicks, rubs or gallops Abdomen:  Soft, nondistended, epigastric and right-sided tenderness. Normal bowel sounds. No appreciable masses or hepatomegaly.  No rebound or guarding.  Rectal:  Not performed. Msk:  Symmetrical without  gross deformities.    Extremities:  Without edema, cyanosis or clubbing. Neurologic:  Alert and oriented x3;  grossly normal neurologically. Skin:  Intact without significant lesions or rashes. Cervical Nodes:  No significant cervical adenopathy. Psych:  Alert and cooperative. Normal affect.  LAB RESULTS: Recent Labs    05/16/20 1445 05/17/20 0525  WBC 10.0 17.9*  HGB 15.3* 15.7*  HCT 44.9 46.1*  PLT 217 217   BMET Recent Labs    05/16/20 1445 05/17/20 0525  NA 136 138  K 3.9 3.8  CL 105 106  CO2 23 22  GLUCOSE 114* 291*  BUN 16 11  CREATININE 0.59 0.45  CALCIUM 9.0 8.8*   LFT Recent Labs    05/16/20 1445 05/17/20 0525  PROT 7.1 7.0  ALBUMIN 4.3 4.2  AST 196* 1,105*  ALT 268* 856*  ALKPHOS 106 180*  BILITOT 1.5* 7.5*  BILIDIR 0.7*  --   IBILI 0.8  --    PT/INR No results for input(s): LABPROT, INR in the last 72 hours.  STUDIES: DG Chest 2 View  Result Date: 05/16/2020 CLINICAL DATA:  Chest pain EXAM: CHEST - 2 VIEW COMPARISON:  09/18/2012 FINDINGS: The heart size and mediastinal contours are within normal limits. Both lungs are clear. The visualized skeletal structures are unremarkable. IMPRESSION: No active cardiopulmonary disease. Electronically Signed   By: Davina Poke D.O.   On: 05/16/2020 15:33   MR 3D Recon At Scanner  Result Date: 05/16/2020 CLINICAL DATA:  Concern for choledocholithiasis. EXAM: MRI ABDOMEN WITHOUT AND WITH CONTRAST (INCLUDING MRCP) TECHNIQUE: Multiplanar multisequence MR imaging of the abdomen was performed both before and after the administration of intravenous contrast. Heavily T2-weighted images of the biliary and pancreatic ducts were obtained, and three-dimensional MRCP images were rendered by post processing. CONTRAST:  7.20mL GADAVIST GADOBUTROL 1 MMOL/ML IV SOLN COMPARISON:  Same-day right upper quadrant ultrasound. CT abdomen pelvis February 12, 2016. FINDINGS: Lower chest: No acute finding. Hepatobiliary: No hepatic  steatosis. Tiny bilobar hepatic cysts the largest of which is  in the inferior aspect of the right lobe of the liver measuring 7 mm on image 17/3. No suspicious hepatic hepatic lesions. Gallbladder is dilated with pericholecystic fluid. No cholelithiasis. There is prominence of the intra and extrahepatic biliary tree with the common duct measuring up to 7 mm on image 17/3. With abrupt narrowing at the ampulla. Peribiliary enhancement most notably involving the pancreatic portions of the common bile duct and at the ampulla on image 40/21. No discrete intraluminal filling defect visualized within the bile ducts. Pancreas: Edematous appearance of the uncinate process/head of the pancreas with adjacent peripancreatic stranding, no walled off collection. No pancreatic hypoenhancement. Probable pancreatic divisum. No pancreatic ductal dilation. Spleen:  Within normal limits in size and appearance. Adrenals/Urinary Tract: Bilateral adrenal glands are unremarkable. Bilateral renal cysts. No suspicious enhancing renal lesions. Stomach/Bowel: Stomach is grossly unremarkable. There is mild hyperenhancement and wall thickening of the second part of duodenum. No evidence of bowel obstruction. Vascular/Lymphatic: No pathologically enlarged lymph nodes identified. No abdominal aortic aneurysm demonstrated. The portal splenic and hepatic veins appear patent. Other:  No walled off fluid collections. Musculoskeletal: No suspicious bone lesions identified. IMPRESSION: 1. Acute interstitial pancreatitis involving the uncinate process and head of the pancreas, no walled off collection or evidence of necrosis. 2. Mild prominence of the intra and extrahepatic biliary tree with abrupt narrowing at the ampulla with peribiliary enhancement most notably involving the pancreatic portions of the common bile duct and at the ampulla. No choledocholithiasis or discrete enhancing intraluminal filling defect visualized. Findings which may represent  sequela of recently passed stone however given the narrowing and enhancement ampullary pathology is also a consideration, this could be further evaluated with ERCP. 3. Dilated gallbladder with pericholecystic fluid findings which are likely reactive. However, acute cholecystitis not completely excluded. 4. Probable pancreatic divisum. Electronically Signed   By: Dahlia Bailiff MD   On: 05/16/2020 21:55   MR ABDOMEN MRCP W WO CONTAST  Result Date: 05/16/2020 CLINICAL DATA:  Concern for choledocholithiasis. EXAM: MRI ABDOMEN WITHOUT AND WITH CONTRAST (INCLUDING MRCP) TECHNIQUE: Multiplanar multisequence MR imaging of the abdomen was performed both before and after the administration of intravenous contrast. Heavily T2-weighted images of the biliary and pancreatic ducts were obtained, and three-dimensional MRCP images were rendered by post processing. CONTRAST:  7.75m GADAVIST GADOBUTROL 1 MMOL/ML IV SOLN COMPARISON:  Same-day right upper quadrant ultrasound. CT abdomen pelvis February 12, 2016. FINDINGS: Lower chest: No acute finding. Hepatobiliary: No hepatic steatosis. Tiny bilobar hepatic cysts the largest of which is in the inferior aspect of the right lobe of the liver measuring 7 mm on image 17/3. No suspicious hepatic hepatic lesions. Gallbladder is dilated with pericholecystic fluid. No cholelithiasis. There is prominence of the intra and extrahepatic biliary tree with the common duct measuring up to 7 mm on image 17/3. With abrupt narrowing at the ampulla. Peribiliary enhancement most notably involving the pancreatic portions of the common bile duct and at the ampulla on image 40/21. No discrete intraluminal filling defect visualized within the bile ducts. Pancreas: Edematous appearance of the uncinate process/head of the pancreas with adjacent peripancreatic stranding, no walled off collection. No pancreatic hypoenhancement. Probable pancreatic divisum. No pancreatic ductal dilation. Spleen:  Within  normal limits in size and appearance. Adrenals/Urinary Tract: Bilateral adrenal glands are unremarkable. Bilateral renal cysts. No suspicious enhancing renal lesions. Stomach/Bowel: Stomach is grossly unremarkable. There is mild hyperenhancement and wall thickening of the second part of duodenum. No evidence of bowel obstruction. Vascular/Lymphatic: No pathologically enlarged  lymph nodes identified. No abdominal aortic aneurysm demonstrated. The portal splenic and hepatic veins appear patent. Other:  No walled off fluid collections. Musculoskeletal: No suspicious bone lesions identified. IMPRESSION: 1. Acute interstitial pancreatitis involving the uncinate process and head of the pancreas, no walled off collection or evidence of necrosis. 2. Mild prominence of the intra and extrahepatic biliary tree with abrupt narrowing at the ampulla with peribiliary enhancement most notably involving the pancreatic portions of the common bile duct and at the ampulla. No choledocholithiasis or discrete enhancing intraluminal filling defect visualized. Findings which may represent sequela of recently passed stone however given the narrowing and enhancement ampullary pathology is also a consideration, this could be further evaluated with ERCP. 3. Dilated gallbladder with pericholecystic fluid findings which are likely reactive. However, acute cholecystitis not completely excluded. 4. Probable pancreatic divisum. Electronically Signed   By: Dahlia Bailiff MD   On: 05/16/2020 21:55   US ABDOMEN LIMITED RUQ (LIVER/GB)  Result Date: 05/16/2020 CLINICAL DATA:  60 year old female with RIGHT UPPER quadrant abdominal pain for 1 day. EXAM: ULTRASOUND ABDOMEN LIMITED RIGHT UPPER QUADRANT COMPARISON:  02/12/2016 CT and 02/25/2015 ultrasound FINDINGS: Gallbladder: The gallbladder is unremarkable. There is no evidence of cholelithiasis, gallbladder wall thickening or sonographic Murphy sign. A trace amount of pericholecystic fluid is noted.  Common bile duct: Diameter: 9 mm-distended. The LOWER CBD is not visualized. No intrahepatic biliary dilatation identified. Liver: No focal lesion identified. Within normal limits in parenchymal echogenicity. Portal vein is patent on color Doppler imaging with normal direction of blood flow towards the liver. Other: None. IMPRESSION: 1. CBD dilatation - CBD obstruction is not excluded. An obstructing cause is not identified, but the LOWER CBD is not visualized. 2. No evidence of cholelithiasis or gallbladder wall thickening. 3. Unremarkable liver. Electronically Signed   By: Margarette Canada M.D.   On: 05/16/2020 16:20      Impression / Plan:   Assessment: Principal Problem:   Acute gallstone pancreatitis Active Problems:   GERD (gastroesophageal reflux disease)   Chronic gastritis   Ashley Contreras is a 60 y.o. y/o female with a prominent intra and extra hepatic biliary ductal dilatation with abrupt narrowing of the ampulla.  There was a report of probable pancreatic divisum and acute interstitial pancreatitis involving the uncinate process and head of the pancreas.  Plan:  The plan for this patient would be to bring the patient to endoscopy today and perform an ERCP for the patient's prominent intra and extrahepatic biliary ductal dilatation and abnormality of the ampulla seen on the MRCP.  The patient's abnormal liver enzymes seem to be higher than would be expected with a case of obstructive jaundice from a gallstone.  The patient's labs also increased despite the MRCP showing no stones in the common bile duct.  The patient and her husband have been told that I will be placing a stent and not doing a sphincterotomy today because of the patient getting Lovenox last night.  This will facilitate drainage and hopefully the liver enzymes will start to recede.  I have explained the risks of the ERCP including pancreatitis, bleeding, perforation and failed procedure due to inability to cannulate.  The  patient and her husband agree to proceeding with the ERCP today.  Thank you for involving me in the care of this patient.      LOS: 1 day   Ashley Lame, MD, [facility] 05/17/2020, 11:43 AM,  Pager [phone] 7am-5pm  Check AMION for 5pm -7am coverage and on  weekends   Note: This dictation was prepared with Dragon dictation along with smaller phrase technology. Any transcriptional errors that result from this process are unintentional.

## 2020-05-17 NOTE — Consult Note (Signed)
Patient ID: Ashley Contreras, female   DOB: 1960/08/18, 60 y.o.   MRN: [id number]  HPI Ashley Contreras is a 60 y.o. female seen in consultation at the request of Dr.Wohl.  Is discussed with him in detail.  She presented last night with abdominal pain.  Patient reports the pain started epigastric region is moderate to severe intensity intermittent and sharp in nature.  Onset of symptoms was yesterday.  Pain exacerbated after having breakfast. He has had a long history of GI issues with right upper quadrant pain.  She has had multiple ultrasound HIDA scans and endoscopies.  On all the ultrasounds that I have reviewed within our [facility] health system she never had a stone.  I went to the [facility] and actually at [place] on December 2021 she did have gallstones but no evidence of cholecystitis.  Her common bile duct at that time was normal.  During this hospitalization she did have an ultrasound as well as an MRCP that I have personally reviewed there is evidence of pancreatitis without any necrosis and there is also evidence of biliary dilation with filling defect distal the common bile duct.  Likely pancreatic divisum.  He also had dilated gallbladder without gallstones.  He did have an AST of 1105, ALT of 856 alkaline phosphatase 180 and total bilirubin of 7.5 this morning.  This all went up as compared to yesterday's.  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  She is able to perform more than 4 METS of activity without any shortness of breath or chest pain.  She does have a history of prior diagnostic laparoscopy for endometriosis in the remote past.  She also had a history of abdominal hysterectomy. Serous ECHO a white count of 17.9 hemoglobin of 15.7 and platelets of 217 lipase was 624 yesterday and today was 271.  HPI  Past Medical History:  Diagnosis Date  . Abnormal Pap smear 1989   s/p colposcopy  . Allergy    hay fever  . Arthritis   . Asthma   . BRCA negative 10/2018    MyRisk neg except ATM VUS  . Chicken pox   . Colon polyp   . Complication of anesthesia   . Family history of adverse reaction to anesthesia    sister - slow to wake  . Family history of breast cancer   . Fatty liver    seen by hepatology  . GERD (gastroesophageal reflux disease)   . Increased risk of breast cancer 10/2018   IBIS=24.7%/riskscore=15.8%  . Mitral valve prolapse   . PONV (postoperative nausea and vomiting)   . Shingles    waist, right side  . UTI (lower urinary tract infection)   . Wears contact lenses     Past Surgical History:  Procedure Laterality Date  . ABDOMINAL HYSTERECTOMY  2008  . BREAST BIOPSY Left    benign  . calcaneous osteotomy  06/2011  . COLONOSCOPY WITH PROPOFOL N/A 02/17/2017   Procedure: COLONOSCOPY WITH PROPOFOL;  Surgeon: Lucilla Lame, MD;  Location: [place];  Service: Endoscopy;  Laterality: N/A;  . ESOPHAGOGASTRODUODENOSCOPY (EGD) WITH PROPOFOL N/A 02/17/2017   Procedure: ESOPHAGOGASTRODUODENOSCOPY (EGD) WITH PROPOFOL;  Surgeon: Lucilla Lame, MD;  Location: [place];  Service: Endoscopy;  Laterality: N/A;  . foot and ankle surgery    . OOPHORECTOMY    . TONSILLECTOMY AND ADENOIDECTOMY  2008    Family History  Problem Relation Age of Onset  . Arthritis Mother   .  Hypertension Mother   . Mental illness Mother   . Diabetes Mother   . Breast cancer Mother 80       right breast  . Hyperlipidemia Father   . Heart disease Father   . Cancer Father        lung  . Mental illness Sister   . Myasthenia gravis Sister   . Multiple sclerosis Sister   . Multiple sclerosis Other   . Stroke Maternal Aunt   . Breast cancer Maternal Aunt 70       right breast  . Stroke Maternal Grandmother   . Hypertension Maternal Grandmother   . Diabetes Maternal Grandmother   . Breast cancer Maternal Aunt 72       right breast    Social History Social History   Tobacco Use  . Smoking status: Never Smoker  . Smokeless tobacco:  Never Used  Vaping Use  . Vaping Use: Never used  Substance Use Topics  . Alcohol use: No  . Drug use: No    Allergies  Allergen Reactions  . Penicillins Swelling  . Cinnamon Swelling    Mouth and tongue become raw Swelling of tongue Swelling of tongue Mouth and tongue become raw     Current Facility-Administered Medications  Medication Dose Route Frequency Provider Last Rate Last Admin  . HYDROmorphone (DILAUDID) injection 1 mg  1 mg Intravenous Q2H PRN Sharion Settler, NP   1 mg at 05/17/20 0905  . lactated ringers infusion   Intravenous Continuous Regalado, Belkys A, MD 150 mL/hr at 05/17/20 1218 Continued from Pre-op at 05/17/20 1218  . meropenem (MERREM) 1 g in sodium chloride 0.9 % 100 mL IVPB  1 g Intravenous Q8H [name], RPH 200 mL/hr at 05/17/20 1023 1 g at 05/17/20 1023  . ondansetron (ZOFRAN) tablet 4 mg  4 mg Oral Q6H PRN Elwyn Reach, MD       Or  . ondansetron (ZOFRAN) injection 4 mg  4 mg Intravenous Q6H PRN Elwyn Reach, MD   4 mg at 05/17/20 0417  . pantoprazole (PROTONIX) injection 40 mg  40 mg Intravenous Q12H Regalado, Belkys A, MD   40 mg at 05/17/20 0908     Review of Systems Full ROS  was asked and was negative except for the information on the HPI  Physical Exam Blood pressure 127/75, pulse 98, temperature 97.6 F (36.4 C), temperature source Temporal, resp. rate 18, height $RemoveBe'5\' 6"'duoosDxsp$  (1.676 m), weight 65.8 kg, SpO2 97 %. CONSTITUTIONAL: NAD EYES: Pupils are equal, round, , Sclera are non-icteric. EARS, NOSE, MOUTH AND THROAT: She is wearing a mask.Hearing is intact to voice. LYMPH NODES:  Lymph nodes in the neck are normal. RESPIRATORY:  Lungs are clear. There is normal respiratory effort, with equal breath sounds bilaterally, and without pathologic use of accessory muscles. CARDIOVASCULAR: Heart is regular without murmurs, gallops, or rubs. GI: The abdomen is soft, mild tenderness epigastric area, no peritonitis, no Murphy.r, and  nondistended. There are no palpable masses. There is no hepatosplenomegaly. There are normal bowel sounds  GU: Rectal deferred.   MUSCULOSKELETAL: Normal muscle strength and tone. No cyanosis or edema.   SKIN: Turgor is good and there are no pathologic skin lesions or ulcers. NEUROLOGIC: Motor and sensation is grossly normal. Cranial nerves are grossly intact. PSYCH:  Oriented to person, place and time. Affect is normal.  Data Reviewed  I have personally reviewed the patient's imaging, laboratory findings and medical records.    Assessment/Plan  60 year old female admitted with presumed gallstone pancreatitis.  Now she is status post ERCP and is doing well.  Discussed with the patient in detail about her disease process.  Although no stones were seen on the current MRCP or ultrasound during this hospitalization she did have a stone during the ERCP and she also had a documented gallstone on December last year at acute care facility. All those reasons I do think that this should be treated as gallstone pancreatitis.  Thought in mind we do recommend cholecystectomy.  We may be able to perform cholecystectomy tomorrow if she continues to do well and if she does not develop worsening pancreatitis. I discussed the procedure in detail.  The patient was given Neurosurgeon.  We discussed the risks and benefits of a laparoscopic cholecystectomy and possible cholangiogram including, but not limited to bleeding, infection, injury to surrounding structures such as the intestine or liver, bile leak, retained gallstones, need to convert to an open procedure, prolonged diarrhea, blood clots such as  DVT, common bile duct injury, anesthesia risks, and possible need for additional procedures.  The likelihood of improvement in symptoms and return to the patient's normal status is good. We discussed the typical post-operative recovery course. I agree w current regimen of crystalloids, antibiotics and pancreatic  rest.  If she develops pancreatitis we can tentatively do her surgery next Thursday. She understands. Extensive counseling provided.   Caroleen Hamman, MD FACS General Surgeon 05/17/2020, 2:24 PM

## 2020-05-17 NOTE — Anesthesia Preprocedure Evaluation (Signed)
Anesthesia Evaluation  Patient identified by MRN, date of birth, ID band Patient awake    Reviewed: Allergy & Precautions, H&P , NPO status , Patient's Chart, lab work & pertinent test results  History of Anesthesia Complications (+) PONV, Family history of anesthesia reaction and history of anesthetic complications  Airway Mallampati: III  TM Distance: <3 FB Neck ROM: full    Dental  (+) Chipped, Caps   Pulmonary neg shortness of breath, asthma ,    Pulmonary exam normal        Cardiovascular Exercise Tolerance: Good (-) Past MI Normal cardiovascular exam+ Valvular Problems/Murmurs MVP      Neuro/Psych  Neuromuscular disease negative psych ROS   GI/Hepatic Neg liver ROS, GERD  Medicated and Controlled,  Endo/Other  Hyperthyroidism   Renal/GU negative Renal ROS  negative genitourinary   Musculoskeletal  (+) Arthritis ,   Abdominal   Peds  Hematology negative hematology ROS (+)   Anesthesia Other Findings Patient is NPO appropriate and reports no nausea or vomiting today.  Past Medical History: 1989: Abnormal Pap smear     Comment:  s/p colposcopy No date: Allergy     Comment:  hay fever No date: Arthritis No date: Asthma 10/2018: BRCA negative     Comment:  MyRisk neg except ATM VUS No date: Chicken pox No date: Colon polyp No date: Complication of anesthesia No date: Family history of adverse reaction to anesthesia     Comment:  sister - slow to wake No date: Family history of breast cancer No date: Fatty liver     Comment:  seen by hepatology No date: GERD (gastroesophageal reflux disease) 10/2018: Increased risk of breast cancer     Comment:  IBIS=24.7%/riskscore=15.8% No date: Mitral valve prolapse No date: PONV (postoperative nausea and vomiting) No date: Shingles     Comment:  waist, right side No date: UTI (lower urinary tract infection) No date: Wears contact lenses  Past Surgical  History: 2008: ABDOMINAL HYSTERECTOMY No date: BREAST BIOPSY; Left     Comment:  benign 06/2011: calcaneous osteotomy 02/17/2017: COLONOSCOPY WITH PROPOFOL; N/A     Comment:  Procedure: COLONOSCOPY WITH PROPOFOL;  Surgeon: Lucilla Lame, MD;  Location: [place];  Service:               Endoscopy;  Laterality: N/A; 02/17/2017: ESOPHAGOGASTRODUODENOSCOPY (EGD) WITH PROPOFOL; N/A     Comment:  Procedure: ESOPHAGOGASTRODUODENOSCOPY (EGD) WITH               PROPOFOL;  Surgeon: Lucilla Lame, MD;  Location: [place];  Service: Endoscopy;  Laterality: N/A; No date: foot and ankle surgery No date: OOPHORECTOMY 2008: TONSILLECTOMY AND ADENOIDECTOMY  BMI    Body Mass Index: 23.40 kg/m      Reproductive/Obstetrics negative OB ROS                             Anesthesia Physical Anesthesia Plan  ASA: III  Anesthesia Plan: General   Post-op Pain Management:    Induction: Intravenous  PONV Risk Score and Plan: Propofol infusion and TIVA  Airway Management Planned: Natural Airway and Nasal Cannula  Additional Equipment:   Intra-op Plan:   Post-operative Plan:   Informed Consent: I have reviewed the patients History and Physical, chart, labs  and discussed the procedure including the risks, benefits and alternatives for the proposed anesthesia with the patient or authorized representative who has indicated his/her understanding and acceptance.     Dental Advisory Given  Plan Discussed with: Anesthesiologist, CRNA and Surgeon  Anesthesia Plan Comments: (Patient consented for risks of anesthesia including but not limited to:  - adverse reactions to medications - risk of airway placement if required - damage to eyes, teeth, lips or other oral mucosa - nerve damage due to positioning  - sore throat or hoarseness - Damage to heart, brain, nerves, lungs, other parts of body or loss of life  Patient voiced  understanding.)        Anesthesia Quick Evaluation

## 2020-05-17 NOTE — Progress Notes (Signed)
Pharmacy Antibiotic Note  Ashley Contreras is a 60 y.o. female with PMH of of gallstones, GERD, fatty liver admitted on 05/16/2020 with acute gallstone pancreatitis.  Pharmacy has been consulted for meropenem dosing. She has a penicillin allergy which she confirms to me causes facial swelling with no previous history of cephalosporin exposure.  Plan: start meropenem 1 gram IV every 8 hours  Height: 5\' 6"  (167.6 cm) Weight: 65.8 kg (145 lb) IBW/kg (Calculated) : 59.3  Temp (24hrs), Avg:98.1 F (36.7 C), Min:97.9 F (36.6 C), Max:98.5 F (36.9 C)  Recent Labs  Lab 05/16/20 1445 05/17/20 0525  WBC 10.0 17.9*  CREATININE 0.59 0.45    Estimated Creatinine Clearance: 70.9 mL/min (by C-G formula based on SCr of 0.45 mg/dL).    Allergies  Allergen Reactions  . Penicillins Swelling  . Cinnamon Swelling    Mouth and tongue become raw Swelling of tongue Swelling of tongue Mouth and tongue become raw     Antimicrobials this admission: meropenem 3/27 >>   Microbiology results: 3/27 BCx: pending 3/26 SARS CoV-2: negative 3/26 influenza A/B: negative   Thank you for allowing pharmacy to be a part of this patient's care.  [name] 05/17/2020 8:00 AM

## 2020-05-17 NOTE — Op Note (Signed)
[facility] Gastroenterology Patient Name: Sundeep Cary Procedure Date: 05/17/2020 12:06 PM MRN: [id number] Account #: [id number] Date of Birth: 1960-05-26 Admit Type: Inpatient Age: 60 Room: [facility] ENDO ROOM 4 Gender: Female Note Status: Finalized Procedure:             ERCP Indications:           Biliary dilation on magnetic resonance                         cholangiopancreatography, Jaundice, Elevated liver                         enzymes Providers:             Lucilla Lame MD, MD Referring MD:          Janine Ores. Rosanna Randy, MD (Referring MD) Medicines:             Propofol per Anesthesia Complications:         No immediate complications. Procedure:             Pre-Anesthesia Assessment:                        - Prior to the procedure, a History and Physical was                         performed, and patient medications and allergies were                         reviewed. The patient's tolerance of previous                         anesthesia was also reviewed. The risks and benefits                         of the procedure and the sedation options and risks                         were discussed with the patient. All questions were                         answered, and informed consent was obtained. Prior                         Anticoagulants: The patient has taken Lovenox                         (enoxaparin), last dose was day of procedure. ASA                         Grade Assessment: II - A patient with mild systemic                         disease. After reviewing the risks and benefits, the                         patient was deemed in satisfactory condition to  undergo the procedure.                        After obtaining informed consent, the scope was passed                         under direct vision. Throughout the procedure, the                         patient's blood pressure, pulse, and oxygen                          saturations were monitored continuously. The [employer] D single use duodenoscope was                         introduced through the mouth, and used to inject                         contrast into and used to inject contrast into the                         bile duct. The ERCP was accomplished without                         difficulty. The patient tolerated the procedure well. Findings:      The scout film was normal. The major papilla was bulging. The bile duct       was deeply cannulated with the short-nosed traction sphincterotome.       Contrast was injected. I personally interpreted the bile duct images.       There was brisk flow of contrast through the ducts. Image quality was       excellent. Contrast extended to the entire biliary tree. A wire was       passed into the biliary tree. One 7 Fr by 7 cm biliary stent with a       single external flap and a single internal flap was placed 5 cm into the       common bile duct. Bile flowed through the stent. The stent was in good       position. There was a small stone impacted in the ampulla that was       removed. Impression:            - The major papilla appeared to be bulging.                        - One biliary stent was placed into the common bile                         duct. Recommendation:        - Return patient to hospital ward for ongoing care.                        - Clear liquid diet.                        -  Watch for pancreatitis, bleeding, perforation, and                         cholangitis.                        - Repeat ERCP in 1 month to remove stent. Procedure Code(s):     --- Professional ---                        [phone], Endoscopic retrograde cholangiopancreatography                         (ERCP); with placement of endoscopic stent into                         biliary or pancreatic duct, including pre- and                         post-dilation and guide wire  passage, when performed,                         including sphincterotomy, when performed, each stent                        [id number], Endoscopic catheterization of the biliary                         ductal system, radiological supervision and                         interpretation Diagnosis Code(s):     --- Professional ---                        R17, Unspecified jaundice                        R74.8, Abnormal levels of other serum enzymes                        K83.9, Disease of biliary tract, unspecified CPT copyright 2019 American Medical Association. All rights reserved. The codes documented in this report are preliminary and upon coder review may  be revised to meet current compliance requirements. Lucilla Lame MD, MD 05/17/2020 12:56:11 PM This report has been signed electronically. Number of Addenda: 0 Note Initiated On: 05/17/2020 12:06 PM Estimated Blood Loss:  Estimated blood loss: none.      [facility]

## 2020-05-17 NOTE — Anesthesia Postprocedure Evaluation (Signed)
Anesthesia Post Note  Patient: Ashley Contreras  Procedure(s) Performed: ENDOSCOPIC RETROGRADE CHOLANGIOPANCREATOGRAPHY (ERCP) WITH PROPOFOL (N/A )  Patient location during evaluation: Endoscopy Anesthesia Type: General Level of consciousness: awake and alert Pain management: pain level controlled Vital Signs Assessment: post-procedure vital signs reviewed and stable Respiratory status: spontaneous breathing, nonlabored ventilation, respiratory function stable and patient connected to nasal cannula oxygen Cardiovascular status: blood pressure returned to baseline and stable Postop Assessment: no apparent nausea or vomiting Anesthetic complications: no   No complications documented.   Last Vitals:  Vitals:   05/17/20 1316 05/17/20 1654  BP: 127/75 123/68  Pulse: 98 83  Resp: 18 16  Temp:  36.6 C  SpO2: 97% 99%    Last Pain:  Vitals:   05/17/20 1654  TempSrc: Oral  PainSc:                  Precious Haws Piscitello

## 2020-05-17 NOTE — Transfer of Care (Signed)
Immediate Anesthesia Transfer of Care Note  Patient: Ashley Contreras  Procedure(s) Performed: ENDOSCOPIC RETROGRADE CHOLANGIOPANCREATOGRAPHY (ERCP) WITH PROPOFOL (N/A )  Patient Location: PACU  Anesthesia Type:MAC  Level of Consciousness: awake and alert   Airway & Oxygen Therapy: Patient Spontanous Breathing  Post-op Assessment: Report given to RN  Post vital signs: Reviewed and stable  Last Vitals:  Vitals Value Taken Time  BP 126/58 05/17/20 1254  Temp 37 C 05/17/20 1254  Pulse 100 05/17/20 1254  Resp 16 05/17/20 1254  SpO2 99 % 05/17/20 1254    Last Pain:  Vitals:   05/17/20 0903  TempSrc:   PainSc: 7          Complications: No complications documented.

## 2020-05-18 ENCOUNTER — Encounter: Payer: Self-pay | Admitting: Internal Medicine

## 2020-05-18 ENCOUNTER — Inpatient Hospital Stay: Payer: 59 | Admitting: Anesthesiology

## 2020-05-18 ENCOUNTER — Encounter
Admission: EM | Disposition: A | Discharge status: Home / Self Care | Payer: Self-pay | Source: Home / Self Care | Attending: Internal Medicine

## 2020-05-18 DIAGNOSIS — K851 Biliary acute pancreatitis without necrosis or infection: Secondary | ICD-10-CM | POA: Diagnosis not present

## 2020-05-18 DIAGNOSIS — K812 Acute cholecystitis with chronic cholecystitis: Secondary | ICD-10-CM | POA: Diagnosis not present

## 2020-05-18 LAB — CBC
HCT: 37.4 % (ref 36.0–46.0)
Hemoglobin: 12.8 g/dL (ref 12.0–15.0)
MCH: 32 pg (ref 26.0–34.0)
MCHC: 34.2 g/dL (ref 30.0–36.0)
MCV: 93.5 fL (ref 80.0–100.0)
Platelets: 182 10*3/uL (ref 150–400)
RBC: 4 MIL/uL (ref 3.87–5.11)
RDW: 13.2 % (ref 11.5–15.5)
WBC: 9.9 10*3/uL (ref 4.0–10.5)
nRBC: 0 % (ref 0.0–0.2)

## 2020-05-18 LAB — COMPREHENSIVE METABOLIC PANEL
ALT: 742 U/L — ABNORMAL HIGH (ref 0–44)
AST: 303 U/L — ABNORMAL HIGH (ref 15–41)
Albumin: 3.2 g/dL — ABNORMAL LOW (ref 3.5–5.0)
Alkaline Phosphatase: 188 U/L — ABNORMAL HIGH (ref 38–126)
Anion gap: 5 (ref 5–15)
BUN: 7 mg/dL (ref 6–20)
CO2: 26 mmol/L (ref 22–32)
Calcium: 8.7 mg/dL — ABNORMAL LOW (ref 8.9–10.3)
Chloride: 109 mmol/L (ref 98–111)
Creatinine, Ser: 0.53 mg/dL (ref 0.44–1.00)
GFR, Estimated: >60 mL/min (ref >60)
Glucose, Bld: 100 mg/dL — ABNORMAL HIGH (ref 70–99)
Potassium: 3.4 mmol/L — ABNORMAL LOW (ref 3.5–5.1)
Sodium: 140 mmol/L (ref 135–145)
Total Bilirubin: 1.5 mg/dL — ABNORMAL HIGH (ref 0.3–1.2)
Total Protein: 5.5 g/dL — ABNORMAL LOW (ref 6.5–8.1)

## 2020-05-18 LAB — LIPASE, BLOOD: Lipase: 129 U/L — ABNORMAL HIGH (ref 11–51)

## 2020-05-18 LAB — GLUCOSE, CAPILLARY
Glucose-Capillary: 93 mg/dL (ref 70–99)
Glucose-Capillary: 212 mg/dL — ABNORMAL HIGH (ref 70–99)
Glucose-Capillary: 140 mg/dL — ABNORMAL HIGH (ref 70–99)

## 2020-05-18 SURGERY — CHOLECYSTECTOMY, ROBOT-ASSISTED, LAPAROSCOPIC
Anesthesia: General

## 2020-05-18 MED ORDER — KETOROLAC TROMETHAMINE 30 MG/ML IJ SOLN
INTRAMUSCULAR | Status: DC | PRN
  Administered 2020-05-18: 30 mg via INTRAVENOUS

## 2020-05-18 MED ORDER — SODIUM CHLORIDE 0.9 % IV SOLN
INTRAVENOUS | Status: DC | PRN
  Administered 2020-05-18: 30 ug/min via INTRAVENOUS

## 2020-05-18 MED ORDER — DEXAMETHASONE SODIUM PHOSPHATE 10 MG/ML IJ SOLN
INTRAMUSCULAR | Status: DC | PRN
  Administered 2020-05-18: 10 mg via INTRAVENOUS

## 2020-05-18 MED ORDER — APREPITANT 40 MG PO CAPS: 40.0000 mg | ORAL_CAPSULE | Freq: Once | ORAL | Status: AC

## 2020-05-18 MED ORDER — FENTANYL CITRATE (PF) 100 MCG/2ML IJ SOLN
INTRAMUSCULAR | Status: DC | PRN
  Administered 2020-05-18 (×2): 50 ug via INTRAVENOUS

## 2020-05-18 MED ORDER — PROPOFOL 10 MG/ML IV BOLUS
INTRAVENOUS | Status: AC
  Filled 2020-05-18: qty 20

## 2020-05-18 MED ORDER — FENTANYL CITRATE (PF) 100 MCG/2ML IJ SOLN
INTRAMUSCULAR | Status: AC
  Filled 2020-05-18: qty 2

## 2020-05-18 MED ORDER — PHENYLEPHRINE HCL (PRESSORS) 10 MG/ML IV SOLN
INTRAVENOUS | Status: DC | PRN
  Administered 2020-05-18 (×4): 100 ug via INTRAVENOUS

## 2020-05-18 MED ORDER — PROMETHAZINE HCL 25 MG/ML IJ SOLN: 6.2500 mg | INTRAMUSCULAR | Status: DC | PRN

## 2020-05-18 MED ORDER — BUPIVACAINE-EPINEPHRINE (PF) 0.25% -1:200000 IJ SOLN
INTRAMUSCULAR | Status: DC | PRN
  Administered 2020-05-18: 30 mL

## 2020-05-18 MED ORDER — BUPIVACAINE-EPINEPHRINE (PF) 0.25% -1:200000 IJ SOLN
INTRAMUSCULAR | Status: AC
  Filled 2020-05-18: qty 30

## 2020-05-18 MED ORDER — SUGAMMADEX SODIUM 200 MG/2ML IV SOLN
INTRAVENOUS | Status: DC | PRN
  Administered 2020-05-18: 200 mg via INTRAVENOUS
  Administered 2020-05-18: 100 mg via INTRAVENOUS

## 2020-05-18 MED ORDER — MIDAZOLAM HCL 2 MG/2ML IJ SOLN
INTRAMUSCULAR | Status: AC
  Filled 2020-05-18: qty 2

## 2020-05-18 MED ORDER — FENTANYL CITRATE (PF) 100 MCG/2ML IJ SOLN
INTRAMUSCULAR | Status: AC
  Administered 2020-05-18: 25 ug via INTRAVENOUS
  Filled 2020-05-18: qty 2

## 2020-05-18 MED ORDER — APREPITANT 40 MG PO CAPS
ORAL_CAPSULE | ORAL | Status: AC
  Administered 2020-05-18: 40 mg via ORAL
  Filled 2020-05-18: qty 1

## 2020-05-18 MED ORDER — LIDOCAINE HCL (CARDIAC) PF 100 MG/5ML IV SOSY
PREFILLED_SYRINGE | INTRAVENOUS | Status: DC | PRN
  Administered 2020-05-18: 80 mg via INTRAVENOUS

## 2020-05-18 MED ORDER — ACETAMINOPHEN 10 MG/ML IV SOLN
INTRAVENOUS | Status: AC
  Filled 2020-05-18: qty 100

## 2020-05-18 MED ORDER — MIDAZOLAM HCL 2 MG/2ML IJ SOLN
INTRAMUSCULAR | Status: DC | PRN
  Administered 2020-05-18 (×2): 1 mg via INTRAVENOUS

## 2020-05-18 MED ORDER — FENTANYL CITRATE (PF) 100 MCG/2ML IJ SOLN
25.0000 ug | INTRAMUSCULAR | Status: DC | PRN
Start: 2020-05-18 — End: 2020-05-18
  Administered 2020-05-18: 25 ug via INTRAVENOUS

## 2020-05-18 MED ORDER — ACETAMINOPHEN 10 MG/ML IV SOLN
INTRAVENOUS | Status: DC | PRN
  Administered 2020-05-18: 1000 mg via INTRAVENOUS

## 2020-05-18 MED ORDER — ROCURONIUM BROMIDE 100 MG/10ML IV SOLN
INTRAVENOUS | Status: DC | PRN
  Administered 2020-05-18: 40 mg via INTRAVENOUS
  Administered 2020-05-18: 20 mg via INTRAVENOUS

## 2020-05-18 MED ORDER — POTASSIUM CHLORIDE 10 MEQ/100ML IV SOLN
10.0000 meq | INTRAVENOUS | Status: AC
Start: 2020-05-18 — End: 2020-05-18
  Administered 2020-05-18: 10 meq via INTRAVENOUS
  Filled 2020-05-18: qty 100

## 2020-05-18 MED ORDER — POTASSIUM CHLORIDE 10 MEQ/100ML IV SOLN
10.0000 meq | INTRAVENOUS | Status: AC
  Administered 2020-05-18 (×2): 10 meq via INTRAVENOUS
  Filled 2020-05-18 (×2): qty 100

## 2020-05-18 MED ORDER — PROPOFOL 10 MG/ML IV BOLUS
INTRAVENOUS | Status: DC | PRN
  Administered 2020-05-18: 130 mg via INTRAVENOUS

## 2020-05-18 MED ORDER — SCOPOLAMINE 1 MG/3DAYS TD PT72
MEDICATED_PATCH | TRANSDERMAL | Status: AC
  Administered 2020-05-18: 1.5 mg
  Filled 2020-05-18: qty 1

## 2020-05-18 SURGICAL SUPPLY — 48 items
CANISTER SUCT 1200ML W/VALVE (MISCELLANEOUS) ×3 IMPLANT
CANNULA REDUC XI 12-8 STAPL (CANNULA) ×1
CANNULA REDUCER 12-8 DVNC XI (CANNULA) ×2 IMPLANT
CHLORAPREP W/TINT 26 (MISCELLANEOUS) ×3 IMPLANT
CLIP VESOLOCK MED LG 6/CT (CLIP) ×3 IMPLANT
COVER WAND RF STERILE (DRAPES) ×3 IMPLANT
DECANTER SPIKE VIAL GLASS SM (MISCELLANEOUS) ×3 IMPLANT
DEFOGGER SCOPE WARMER CLEARIFY (MISCELLANEOUS) ×3 IMPLANT
DERMABOND ADVANCED (GAUZE/BANDAGES/DRESSINGS) ×1
DERMABOND ADVANCED .7 DNX12 (GAUZE/BANDAGES/DRESSINGS) ×2 IMPLANT
DRAPE ARM DVNC X/XI (DISPOSABLE) ×8 IMPLANT
DRAPE COLUMN DVNC XI (DISPOSABLE) ×2 IMPLANT
DRAPE DA VINCI XI ARM (DISPOSABLE) ×4
DRAPE DA VINCI XI COLUMN (DISPOSABLE) ×1
ELECT CAUTERY BLADE 6.4 (BLADE) ×3 IMPLANT
ELECT REM PT RETURN 9FT ADLT (ELECTROSURGICAL) ×3
ELECTRODE REM PT RTRN 9FT ADLT (ELECTROSURGICAL) ×2 IMPLANT
GLOVE SURG ENC MOIS LTX SZ7 (GLOVE) ×6 IMPLANT
GOWN STRL REUS W/ TWL LRG LVL3 (GOWN DISPOSABLE) ×8 IMPLANT
GOWN STRL REUS W/TWL LRG LVL3 (GOWN DISPOSABLE) ×4
IRRIGATION STRYKERFLOW (MISCELLANEOUS) IMPLANT
IRRIGATOR STRYKERFLOW (MISCELLANEOUS)
IV NS 1000ML (IV SOLUTION)
IV NS 1000ML BAXH (IV SOLUTION) IMPLANT
KIT PINK PAD W/HEAD ARE REST (MISCELLANEOUS) ×3
KIT PINK PAD W/HEAD ARM REST (MISCELLANEOUS) ×2 IMPLANT
LABEL OR SOLS (LABEL) ×3 IMPLANT
MANIFOLD NEPTUNE II (INSTRUMENTS) ×3 IMPLANT
NEEDLE HYPO 22GX1.5 SAFETY (NEEDLE) ×3 IMPLANT
NS IRRIG 500ML POUR BTL (IV SOLUTION) ×3 IMPLANT
OBTURATOR OPTICAL STANDARD 8MM (TROCAR) ×1
OBTURATOR OPTICAL STND 8 DVNC (TROCAR) ×2
OBTURATOR OPTICALSTD 8 DVNC (TROCAR) ×2 IMPLANT
PACK LAP CHOLECYSTECTOMY (MISCELLANEOUS) ×3 IMPLANT
PENCIL ELECTRO HAND CTR (MISCELLANEOUS) ×3 IMPLANT
POUCH SPECIMEN RETRIEVAL 10MM (ENDOMECHANICALS) ×3 IMPLANT
SEAL CANN UNIV 5-8 DVNC XI (MISCELLANEOUS) ×6 IMPLANT
SEAL XI 5MM-8MM UNIVERSAL (MISCELLANEOUS) ×3
SET TUBE SMOKE EVAC HIGH FLOW (TUBING) ×3 IMPLANT
SOLUTION ELECTROLUBE (MISCELLANEOUS) ×3 IMPLANT
SPONGE LAP 18X18 RF (DISPOSABLE) ×3 IMPLANT
SPONGE LAP 4X18 RFD (DISPOSABLE) IMPLANT
STAPLER CANNULA SEAL DVNC XI (STAPLE) ×2 IMPLANT
STAPLER CANNULA SEAL XI (STAPLE) ×1
SUT MNCRL AB 4-0 PS2 18 (SUTURE) ×3 IMPLANT
SUT VICRYL 0 AB UR-6 (SUTURE) ×6 IMPLANT
TAPE TRANSPORE STRL 2 31045 (GAUZE/BANDAGES/DRESSINGS) ×3 IMPLANT
TROCAR BALLN GELPORT 12X130M (ENDOMECHANICALS) ×3 IMPLANT

## 2020-05-18 NOTE — Progress Notes (Signed)
[place] Hospital Day(s): 2.   Post op day(s): 1 Day Post-Op.   Interval History:  Patient seen and examined No acute events or new complaints overnight.  Patient reports she is feeling much better this morning No fever, chills, nausea, emesis, or abdominal pain Her leukocytosis is now resolved, down to 9.9K Maintaining her renal function, sCr - 0.53 Mild hypokalemia to 3.4 Her lipase continues to improve, now 129 She continues on Meropenem  NPO for planned laparoscopic cholecystectomy today  Vital signs in last 24 hours: [min-max] current  Temp:  [97.6 F (36.4 C)-98.7 F (37.1 C)] 98.5 F (36.9 C) (03/28 0455) Pulse Rate:  [69-107] 91 (03/28 0455) Resp:  [16-18] 16 (03/28 0455) BP: (117-147)/(58-81) 117/71 (03/28 0455) SpO2:  [97 %-100 %] 97 % (03/28 0455)     Height: 5\' 6"  (167.6 cm) Weight: 65.8 kg BMI (Calculated): 23.41   Intake/Output last 2 shifts:  03/27 0701 - 03/28 0700 In: 1907.9 [I.V.:1707.9; IV Piggyback:200] Out: -    Physical Exam:  Constitutional: alert, cooperative and no distress  Respiratory: breathing non-labored at rest  Cardiovascular: regular rate and sinus rhythm  Gastrointestinal: Soft, non-tender, and non-distended, no rebound/guarding Integumentary: warm, dry, no juandice   Labs:  CBC Latest Ref Rng & Units 05/18/2020 05/17/2020 05/16/2020  WBC 4.0 - 10.5 K/uL 9.9 17.9(H) 10.0  Hemoglobin 12.0 - 15.0 g/dL 12.8 15.7(H) 15.3(H)  Hematocrit 36.0 - 46.0 % 37.4 46.1(H) 44.9  Platelets 150 - 400 K/uL 182 217 217   CMP Latest Ref Rng & Units 05/18/2020 05/17/2020 05/16/2020  Glucose 70 - 99 mg/dL 100(H) 291(H) 114(H)  BUN 6 - 20 mg/dL 7 11 16   Creatinine 0.44 - 1.00 mg/dL 0.53 0.45 0.59  Sodium 135 - 145 mmol/L 140 138 136  Potassium 3.5 - 5.1 mmol/L 3.4(L) 3.8 3.9  Chloride 98 - 111 mmol/L 109 106 105  CO2 22 - 32 mmol/L 26 22 23   Calcium 8.9 - 10.3 mg/dL 8.7(L) 8.8(L) 9.0  Total Protein 6.5 - 8.1  g/dL 5.5(L) 7.0 7.1  Total Bilirubin 0.3 - 1.2 mg/dL 1.5(H) 7.5(H) 1.5(H)  Alkaline Phos 38 - 126 U/L 188(H) 180(H) 106  AST 15 - 41 U/L 303(H) 1,105(H) 196(H)  ALT 0 - 44 U/L 742(H) 856(H) 268(H)     Imaging studies: No new pertinent imaging studies   Assessment/Plan:  60 y.o. female with gallstone pancreatitis s/p ERCP on 03/27   - Will plan on robotic assisted laparoscopic cholecystectomy today with Dr Dahlia Byes pending OR/Anesthesia availability  - All risks, benefits, and alternatives to above procedure(s) were discussed with the patient, all of her questions were answered to her expressed satisfaction, patient expresses she wishes to proceed, and informed consent was obtained.  - NPO + IVF support   - IV ABx (Meropenem)   - Monitor abdominal examination  - Pain control prn; antiemetics prn  - Replete K+; monitor  - Further management per primary service; we will follow    All of the above findings and recommendations were discussed with the patient, patient's family (husband at bedside), and the medical team, and all of patient's and family's questions were answered to their expressed satisfaction.  -- Edison Simon, PA-C [facility] 05/18/2020, 7:36 AM [phone] M-F: 7am - 4pm

## 2020-05-18 NOTE — Anesthesia Procedure Notes (Signed)
Procedure Name: Intubation Date/Time: 05/18/2020 10:41 AM Performed by: Allean Found, CRNA Pre-anesthesia Checklist: Patient identified, Patient being monitored, Timeout performed, Emergency Drugs available and Suction available Patient Re-evaluated:Patient Re-evaluated prior to induction Oxygen Delivery Method: Circle system utilized Preoxygenation: Pre-oxygenation with 100% oxygen Induction Type: IV induction Ventilation: Mask ventilation without difficulty Laryngoscope Size: 3 and McGraph Grade View: Grade II Tube type: Oral Tube size: 7.0 mm Number of attempts: 1 Airway Equipment and Method: Stylet Placement Confirmation: ETT inserted through vocal cords under direct vision,  positive ETCO2 and breath sounds checked- equal and bilateral Secured at: 21 cm Tube secured with: Tape Dental Injury: Teeth and Oropharynx as per pre-operative assessment  Difficulty Due To: Difficult Airway- due to anterior larynx

## 2020-05-18 NOTE — Anesthesia Postprocedure Evaluation (Signed)
Anesthesia Post Note  Patient: AYSLIN KUNDERT  Procedure(s) Performed: XI ROBOTIC ASSISTED LAPAROSCOPIC CHOLECYSTECTOMY (N/A ) INDOCYANINE GREEN FLUORESCENCE IMAGING (ICG)  Patient location during evaluation: PACU Anesthesia Type: General Level of consciousness: awake and alert Pain management: pain level controlled Vital Signs Assessment: post-procedure vital signs reviewed and stable Respiratory status: spontaneous breathing, nonlabored ventilation, respiratory function stable and patient connected to nasal cannula oxygen Cardiovascular status: blood pressure returned to baseline and stable Postop Assessment: no apparent nausea or vomiting Anesthetic complications: no   No complications documented.   Last Vitals:  Vitals:   05/18/20 1245 05/18/20 1300  BP: 125/65 129/68  Pulse: 88 80  Resp: 17 17  Temp:  37 C  SpO2: 94% 100%    Last Pain:  Vitals:   05/18/20 1300  TempSrc: Oral  PainSc:                  Martha Clan

## 2020-05-18 NOTE — Transfer of Care (Signed)
Immediate Anesthesia Transfer of Care Note  Patient: Ashley Contreras  Procedure(s) Performed: XI ROBOTIC ASSISTED LAPAROSCOPIC CHOLECYSTECTOMY (N/A ) INDOCYANINE GREEN FLUORESCENCE IMAGING (ICG)  Patient Location: PACU  Anesthesia Type:General  Level of Consciousness: awake and alert   Airway & Oxygen Therapy: Patient Spontanous Breathing and Patient connected to face mask oxygen  Post-op Assessment: Report given to RN and Post -op Vital signs reviewed and stable  Post vital signs: Reviewed and stable  Last Vitals:  Vitals Value Taken Time  BP 138/77 05/18/20 1200  Temp 36.7 C 05/18/20 1158  Pulse 98 05/18/20 1203  Resp 17 05/18/20 1203  SpO2 98 % 05/18/20 1203  Vitals shown include unvalidated device data.  Last Pain:  Vitals:   05/18/20 1158  TempSrc:   PainSc: Asleep         Complications: No complications documented.

## 2020-05-18 NOTE — Op Note (Signed)
Robotic assisted laparoscopic Cholecystectomy  Pre-operative Diagnosis: Acute cholecystitis with gallstone pancreatitis  Post-operative Diagnosis: same  Procedure:  Robotic assisted laparoscopic Cholecystectomy  Surgeon: Caroleen Hamman, MD FACS  Anesthesia: Gen. with endotracheal tube  Findings: Acute  Cholecystitis   Estimated Blood Loss: 5cc       Specimens: Gallbladder           Complications: none   Procedure Details  The patient was seen again in the Holding Room. The benefits, complications, treatment options, and expected outcomes were discussed with the patient. The risks of bleeding, infection, recurrence of symptoms, failure to resolve symptoms, bile duct damage, bile duct leak, retained common bile duct stone, bowel injury, any of which could require further surgery and/or ERCP, stent, or papillotomy were reviewed with the patient. The likelihood of improving the patient's symptoms with return to their baseline status is good.  The patient and/or family concurred with the proposed plan, giving informed consent.  The patient was taken to Operating Room, identified  and the procedure verified as Laparoscopic Cholecystectomy.  A Time Out was held and the above information confirmed.  Prior to the induction of general anesthesia, antibiotic prophylaxis was administered. VTE prophylaxis was in place. General endotracheal anesthesia was then administered and tolerated well. After the induction, the abdomen was prepped with Chloraprep and draped in the sterile fashion. The patient was positioned in the supine position.  Cut down technique was used to enter the abdominal cavity and a Hasson trochar was placed after two vicryl stitches were anchored to the fascia. Pneumoperitoneum was then created with CO2 and tolerated well without any adverse changes in the patient's vital signs.  Three 8-mm ports were placed under direct vision. All skin incisions  were infiltrated with a local  anesthetic agent before making the incision and placing the trocars.   The patient was positioned  in reverse Trendelenburg, robot was brought to the surgical field and docked in the standard fashion.  We made sure all the instrumentation was kept indirect view at all times and that there were no collision between the arms. I scrubbed out and went to the console.  The gallbladder was identified, the fundus grasped and retracted cephalad. Adhesions were lysed bluntly. The infundibulum was grasped and retracted laterally, exposing the peritoneum overlying the triangle of Calot. This was then divided and exposed in a blunt fashion. An extended critical view of the cystic duct and cystic artery was obtained.  The cystic duct was clearly identified and bluntly dissected.   Artery and duct were double clipped and divided. Using ICG cholangiography we visualize the cystic duct and CBD , there was no evidence of bile injuries. The gallbladder was taken from the gallbladder fossa in a retrograde fashion with the electrocautery.  Hemostasis was achieved with the electrocautery. nspection of the right upper quadrant was performed. No bleeding, bile duct injury or leak, or bowel injury was noted. Robotic instruments and robotic arms were undocked in the standard fashion.  I scrubbed back in.  The gallbladder was removed and placed in an Endocatch bag.   Pneumoperitoneum was released.  The periumbilical port site was closed with interrumpted 0 Vicryl sutures. 4-0 subcuticular Monocryl was used to close the skin. Dermabond was  applied.  The patient was then extubated and brought to the recovery room in stable condition. Sponge, lap, and needle counts were correct at closure and at the conclusion of the case.  Caroleen Hamman, MD, FACS

## 2020-05-18 NOTE — Anesthesia Preprocedure Evaluation (Signed)
Anesthesia Evaluation  Patient identified by MRN, date of birth, ID band Patient awake    Reviewed: Allergy & Precautions, H&P , NPO status , Patient's Chart, lab work & pertinent test results  History of Anesthesia Complications (+) PONV, Family history of anesthesia reaction and history of anesthetic complications  Airway Mallampati: III  TM Distance: <3 FB Neck ROM: full    Dental  (+) Chipped, Caps   Pulmonary neg shortness of breath, asthma , neg recent URI,    Pulmonary exam normal        Cardiovascular Exercise Tolerance: Good (-) angina(-) Past MI Normal cardiovascular exam(-) dysrhythmias + Valvular Problems/Murmurs MVP      Neuro/Psych  Neuromuscular disease negative psych ROS   GI/Hepatic Neg liver ROS, GERD  Medicated and Controlled,  Endo/Other  Hyperthyroidism   Renal/GU negative Renal ROS  negative genitourinary   Musculoskeletal  (+) Arthritis ,   Abdominal   Peds  Hematology negative hematology ROS (+)   Anesthesia Other Findings Patient is NPO appropriate and reports no nausea or vomiting today.  Past Medical History: 1989: Abnormal Pap smear     Comment:  s/p colposcopy No date: Allergy     Comment:  hay fever No date: Arthritis No date: Asthma 10/2018: BRCA negative     Comment:  MyRisk neg except ATM VUS No date: Chicken pox No date: Colon polyp No date: Complication of anesthesia No date: Family history of adverse reaction to anesthesia     Comment:  sister - slow to wake No date: Family history of breast cancer No date: Fatty liver     Comment:  seen by hepatology No date: GERD (gastroesophageal reflux disease) 10/2018: Increased risk of breast cancer     Comment:  IBIS=24.7%/riskscore=15.8% No date: Mitral valve prolapse No date: PONV (postoperative nausea and vomiting) No date: Shingles     Comment:  waist, right side No date: UTI (lower urinary tract infection) No date:  Wears contact lenses  Past Surgical History: 2008: ABDOMINAL HYSTERECTOMY No date: BREAST BIOPSY; Left     Comment:  benign 06/2011: calcaneous osteotomy 02/17/2017: COLONOSCOPY WITH PROPOFOL; N/A     Comment:  Procedure: COLONOSCOPY WITH PROPOFOL;  Surgeon: Lucilla Lame, MD;  Location: [place];  Service:               Endoscopy;  Laterality: N/A; 02/17/2017: ESOPHAGOGASTRODUODENOSCOPY (EGD) WITH PROPOFOL; N/A     Comment:  Procedure: ESOPHAGOGASTRODUODENOSCOPY (EGD) WITH               PROPOFOL;  Surgeon: Lucilla Lame, MD;  Location: [place];  Service: Endoscopy;  Laterality: N/A; No date: foot and ankle surgery No date: OOPHORECTOMY 2008: TONSILLECTOMY AND ADENOIDECTOMY  BMI    Body Mass Index: 23.40 kg/m      Reproductive/Obstetrics negative OB ROS                             Anesthesia Physical  Anesthesia Plan  ASA: III  Anesthesia Plan: General   Post-op Pain Management:    Induction: Intravenous  PONV Risk Score and Plan: Ondansetron, Dexamethasone, Midazolam, Scopolamine patch - Pre-op, Promethazine and Treatment may vary due to age or medical condition  Airway Management Planned: Oral ETT  Additional Equipment:   Intra-op Plan:  Post-operative Plan: Extubation in OR  Informed Consent: I have reviewed the patients History and Physical, chart, labs and discussed the procedure including the risks, benefits and alternatives for the proposed anesthesia with the patient or authorized representative who has indicated his/her understanding and acceptance.     Dental Advisory Given  Plan Discussed with: Anesthesiologist, CRNA and Surgeon  Anesthesia Plan Comments: (Patient consented for risks of anesthesia including but not limited to:  - adverse reactions to medications - risk of airway placement if required - damage to eyes, teeth, lips or other oral mucosa - nerve damage due to  positioning  - sore throat or hoarseness - Damage to heart, brain, nerves, lungs, other parts of body or loss of life  Patient voiced understanding.)        Anesthesia Quick Evaluation

## 2020-05-18 NOTE — Progress Notes (Signed)
PROGRESS NOTE    Ashley Contreras  TKW:[id number] DOB: [birth year] DOA: 05/16/2020 PCP: Jerrol Banana., MD   Brief Narrative: 60 year old with past medical history significant for gallstone, GERD, fatty liver, asthma, shingles who presents to the ED complaining of right upper quadrant abdominal pain, 10 out of 10, associated with nausea and vomiting.  Symptoms started the day prior to admission but got worse the day of admission.  Abdominal pain is exacerbated by activity and after meals.  Patient was found to have gallstone cryptitis, lipase more than 600, transaminases alkaline phosphatase 106, AST 196, ALT 268.  Chest x-ray negative.  Right upper quadrant abdominal ultrasound showed CBD dilation.  No evidence of cholelithiasis of gallbladder wall thickening.  MRCP: Showed acute interstitial pancreatitis, mild prominence of the intra and extrahepatic biliary tree with abrupt narrowing of the ampulla with peribiliary enhancement, most notably involving the pancreatic portion of the common bile duct and the ampulla.  No evidence of choledocholithiasis but suggestion for ERCP to better evaluate.     Assessment & Plan:   Principal Problem:   Acute gallstone pancreatitis Active Problems:   GERD (gastroesophageal reflux disease)   Chronic gastritis   Abnormal liver enzymes   Idiopathic acute pancreatitis without infection or necrosis   Jaundice   Elevated liver enzymes   Disorder of common bile duct   1-Acute Pancreatitis: Gallstone pancreatitis.  - MRCP showed mild prominence of  the intra and extrahepatic biliary tree with abrupt narrowing of the ampulla with peribiliary enhancement, most notably involving the pancreatic portion of the common bile duct and the ampulla. No choledocholithiasis, will need ERCP to further evaluate.  -Continue with IV fluids. Will give IV bolus. Increase IV fluids rate.  -[place]; CBD dilation, CBD obstruction is not excluded.  An obstructing gauze is not a  35, but the lower CBD is not visualized.  No evidence of cholelithiasis or gallbladder wall thickening. -Underwent ERCP; extraction of one stone and s/p Stent placement.  - Lipid panel triglyceride at 70.  -IV Protonix.   -Blood cultures: no growth -Underwent cholecystectomy 3-28 -LFT trending down.   2-Transaminases;  Suspect obstructive etiology.  GI consulted.  Viral hepatitis panel negative LFT trending down.   3-Leukocytosis:  Could be reactive, but will cover for intra abdominal process, cholangitis in setting transaminases.  Started  IV Meropenem. (Patient has pennincilin allergies)  4-Hyperglycemia:  HbA1c. 5.3 Monitor CBG With hyperglycemia related to IV fluids, and acute pancreatitis  Hypokalemia;  Replete IV  Estimated body mass index is 23.4 kg/m as calculated from the following:   Height as of this encounter: 5\' 6"  (1.676 m).   Weight as of this encounter: 65.8 kg.   DVT prophylaxis: Lovenox Code Status: Full code Family Communication: Husband who was at bedside Disposition Plan:  Status is: Inpatient  Remains inpatient appropriate because:IV treatments appropriate due to intensity of illness or inability to take PO   Dispo: The patient is from: Home              Anticipated d/c is to: Home              Patient currently is not medically stable to d/c.   Difficult to place patient No        Consultants:   GI, Dr Allen Norris   Procedures:     Antimicrobials:  Zosyn 3/27  Subjective: She is feeling better, mild abdominal soreness. She just came from surgery.    Objective: Vitals:   05/18/20  1215 05/18/20 1230 05/18/20 1245 05/18/20 1300  BP: 137/70 125/69 125/65 129/68  Pulse: 91 87 88 80  Resp: 17 15 17 17   Temp:  98.9 F (37.2 C)  98.6 F (37 C)  TempSrc:    Oral  SpO2: 96% 95% 94% 100%  Weight:      Height:        Intake/Output Summary (Last 24 hours) at 05/18/2020 1528 Last data filed at 05/18/2020 1241 Gross per 24 hour   Intake 2807.89 ml  Output 10 ml  Net 2797.89 ml   Filed Weights   05/16/20 1437 05/18/20 0938  Weight: 65.8 kg 65.8 kg    Examination:  General exam: NAD Respiratory system: CTA Cardiovascular system: S 1, S 2 RRR Gastrointestinal system: BS present, soft, Mild tenders, 3 small incision Central nervous system: Alert Extremities: No edema   Data Reviewed: I have personally reviewed following labs and imaging studies  CBC: Recent Labs  Lab 05/16/20 1445 05/17/20 0525 05/18/20 0550  WBC 10.0 17.9* 9.9  HGB 15.3* 15.7* 12.8  HCT 44.9 46.1* 37.4  MCV 95.1 93.9 93.5  PLT 217 217 503   Basic Metabolic Panel: Recent Labs  Lab 05/16/20 1445 05/17/20 0525 05/18/20 0550  NA 136 138 140  K 3.9 3.8 3.4*  CL 105 106 109  CO2 23 22 26   GLUCOSE 114* 291* 100*  BUN 16 11 7   CREATININE 0.59 0.45 0.53  CALCIUM 9.0 8.8* 8.7*   GFR: Estimated Creatinine Clearance: 70.9 mL/min (by C-G formula based on SCr of 0.53 mg/dL). Liver Function Tests: Recent Labs  Lab 05/16/20 1445 05/17/20 0525 05/18/20 0550  AST 196* 1,105* 303*  ALT 268* 856* 742*  ALKPHOS 106 180* 188*  BILITOT 1.5* 7.5* 1.5*  PROT 7.1 7.0 5.5*  ALBUMIN 4.3 4.2 3.2*   Recent Labs  Lab 05/16/20 1445 05/17/20 0525 05/18/20 0550  LIPASE 624* 271* 129*   No results for input(s): AMMONIA in the last 168 hours. Coagulation Profile: No results for input(s): INR, PROTIME in the last 168 hours. Cardiac Enzymes: No results for input(s): CKTOTAL, CKMB, CKMBINDEX, TROPONINI in the last 168 hours. BNP (last 3 results) No results for input(s): PROBNP in the last 8760 hours. HbA1C: Recent Labs    05/17/20 0525  HGBA1C 5.3   CBG: Recent Labs  Lab 05/17/20 1638 05/17/20 2042 05/18/20 0759  GLUCAP 112* 159* 93   Lipid Profile: Recent Labs    05/17/20 0525  CHOL 208*  HDL 76  LDLCALC 118*  TRIG 71  CHOLHDL 2.7   Thyroid Function Tests: No results for input(s): TSH, T4TOTAL, FREET4, T3FREE,  THYROIDAB in the last 72 hours. Anemia Panel: No results for input(s): VITAMINB12, FOLATE, FERRITIN, TIBC, IRON, RETICCTPCT in the last 72 hours. Sepsis Labs: No results for input(s): PROCALCITON, LATICACIDVEN in the last 168 hours.  Recent Results (from the past 240 hour(s))  Resp Panel by RT-PCR (Flu A&B, Covid) Nasopharyngeal Swab     Status: None   Collection Time: 05/16/20  6:23 PM   Specimen: Nasopharyngeal Swab; Nasopharyngeal(NP) swabs in vial transport medium  Result Value Ref Range Status   SARS Coronavirus 2 by RT PCR NEGATIVE NEGATIVE Final    Comment: (NOTE) SARS-CoV-2 target nucleic acids are NOT DETECTED.  The SARS-CoV-2 RNA is generally detectable in upper respiratory specimens during the acute phase of infection. The lowest concentration of SARS-CoV-2 viral copies this assay can detect is 138 copies/mL. A negative result does not preclude SARS-Cov-2 infection and should  not be used as the sole basis for treatment or other patient management decisions. A negative result may occur with  improper specimen collection/handling, submission of specimen other than nasopharyngeal swab, presence of viral mutation(s) within the areas targeted by this assay, and inadequate number of viral copies(<138 copies/mL). A negative result must be combined with clinical observations, patient history, and epidemiological information. The expected result is Negative.  Fact Sheet for Patients:  EntrepreneurPulse.com.au  Fact Sheet for Healthcare Providers:  IncredibleEmployment.be  This test is no t yet approved or cleared by the [place] FDA and  has been authorized for detection and/or diagnosis of SARS-CoV-2 by FDA under an Emergency Use Authorization (EUA). This EUA will remain  in effect (meaning this test can be used) for the duration of the COVID-19 declaration under Section 564(b)(1) of the Act, 21 U.S.C.section 360bbb-3(b)(1), unless the  authorization is terminated  or revoked sooner.       Influenza A by PCR NEGATIVE NEGATIVE Final   Influenza B by PCR NEGATIVE NEGATIVE Final    Comment: (NOTE) The Xpert Xpress SARS-CoV-2/FLU/RSV plus assay is intended as an aid in the diagnosis of influenza from Nasopharyngeal swab specimens and should not be used as a sole basis for treatment. Nasal washings and aspirates are unacceptable for Xpert Xpress SARS-CoV-2/FLU/RSV testing.  Fact Sheet for Patients: EntrepreneurPulse.com.au  Fact Sheet for Healthcare Providers: IncredibleEmployment.be  This test is not yet approved or cleared by the [place] FDA and has been authorized for detection and/or diagnosis of SARS-CoV-2 by FDA under an Emergency Use Authorization (EUA). This EUA will remain in effect (meaning this test can be used) for the duration of the COVID-19 declaration under Section 564(b)(1) of the Act, 21 U.S.C. section 360bbb-3(b)(1), unless the authorization is terminated or revoked.  Performed at [facility], [address] [place], [place] [address]   CULTURE, BLOOD (ROUTINE X 2) w Reflex to ID Panel     Status: None (Preliminary result)   Collection Time: 05/17/20  7:48 AM   Specimen: BLOOD  Result Value Ref Range Status   Specimen Description BLOOD RIGHT ARM  Final   Special Requests   Final    BOTTLES DRAWN AEROBIC AND ANAEROBIC Blood Culture adequate volume   Culture   Final    NO GROWTH < 24 HOURS Performed at [facility], [address], [place], [place] [address]    Report Status PENDING  Incomplete  CULTURE, BLOOD (ROUTINE X 2) w Reflex to ID Panel     Status: None (Preliminary result)   Collection Time: 05/17/20  7:48 AM   Specimen: BLOOD  Result Value Ref Range Status   Specimen Description BLOOD RIGHT HAND  Final   Special Requests   Final    BOTTLES DRAWN AEROBIC AND ANAEROBIC Blood Culture results may not be optimal due to an  inadequate volume of blood received in culture bottles   Culture   Final    NO GROWTH < 24 HOURS Performed at [facility], [address], [place], [place] [address]    Report Status PENDING  Incomplete         Radiology Studies: MR 3D Recon At Scanner  Result Date: 05/16/2020 CLINICAL DATA:  Concern for choledocholithiasis. EXAM: MRI ABDOMEN WITHOUT AND WITH CONTRAST (INCLUDING MRCP) TECHNIQUE: Multiplanar multisequence MR imaging of the abdomen was performed both before and after the administration of intravenous contrast. Heavily T2-weighted images of the biliary and pancreatic ducts were obtained, and three-dimensional MRCP  images were rendered by post processing. CONTRAST:  7.35mL GADAVIST GADOBUTROL 1 MMOL/ML IV SOLN COMPARISON:  Same-day right upper quadrant ultrasound. CT abdomen pelvis February 12, 2016. FINDINGS: Lower chest: No acute finding. Hepatobiliary: No hepatic steatosis. Tiny bilobar hepatic cysts the largest of which is in the inferior aspect of the right lobe of the liver measuring 7 mm on image 17/3. No suspicious hepatic hepatic lesions. Gallbladder is dilated with pericholecystic fluid. No cholelithiasis. There is prominence of the intra and extrahepatic biliary tree with the common duct measuring up to 7 mm on image 17/3. With abrupt narrowing at the ampulla. Peribiliary enhancement most notably involving the pancreatic portions of the common bile duct and at the ampulla on image 40/21. No discrete intraluminal filling defect visualized within the bile ducts. Pancreas: Edematous appearance of the uncinate process/head of the pancreas with adjacent peripancreatic stranding, no walled off collection. No pancreatic hypoenhancement. Probable pancreatic divisum. No pancreatic ductal dilation. Spleen:  Within normal limits in size and appearance. Adrenals/Urinary Tract: Bilateral adrenal glands are unremarkable. Bilateral renal cysts. No suspicious enhancing renal lesions.  Stomach/Bowel: Stomach is grossly unremarkable. There is mild hyperenhancement and wall thickening of the second part of duodenum. No evidence of bowel obstruction. Vascular/Lymphatic: No pathologically enlarged lymph nodes identified. No abdominal aortic aneurysm demonstrated. The portal splenic and hepatic veins appear patent. Other:  No walled off fluid collections. Musculoskeletal: No suspicious bone lesions identified. IMPRESSION: 1. Acute interstitial pancreatitis involving the uncinate process and head of the pancreas, no walled off collection or evidence of necrosis. 2. Mild prominence of the intra and extrahepatic biliary tree with abrupt narrowing at the ampulla with peribiliary enhancement most notably involving the pancreatic portions of the common bile duct and at the ampulla. No choledocholithiasis or discrete enhancing intraluminal filling defect visualized. Findings which may represent sequela of recently passed stone however given the narrowing and enhancement ampullary pathology is also a consideration, this could be further evaluated with ERCP. 3. Dilated gallbladder with pericholecystic fluid findings which are likely reactive. However, acute cholecystitis not completely excluded. 4. Probable pancreatic divisum. Electronically Signed   By: Dahlia Bailiff MD   On: 05/16/2020 21:55   DG C-Arm 1-60 Min-No Report  Result Date: 05/17/2020 Fluoroscopy was utilized by the requesting physician.  No radiographic interpretation.   MR ABDOMEN MRCP W WO CONTAST  Result Date: 05/16/2020 CLINICAL DATA:  Concern for choledocholithiasis. EXAM: MRI ABDOMEN WITHOUT AND WITH CONTRAST (INCLUDING MRCP) TECHNIQUE: Multiplanar multisequence MR imaging of the abdomen was performed both before and after the administration of intravenous contrast. Heavily T2-weighted images of the biliary and pancreatic ducts were obtained, and three-dimensional MRCP images were rendered by post processing. CONTRAST:  7.23mL  GADAVIST GADOBUTROL 1 MMOL/ML IV SOLN COMPARISON:  Same-day right upper quadrant ultrasound. CT abdomen pelvis February 12, 2016. FINDINGS: Lower chest: No acute finding. Hepatobiliary: No hepatic steatosis. Tiny bilobar hepatic cysts the largest of which is in the inferior aspect of the right lobe of the liver measuring 7 mm on image 17/3. No suspicious hepatic hepatic lesions. Gallbladder is dilated with pericholecystic fluid. No cholelithiasis. There is prominence of the intra and extrahepatic biliary tree with the common duct measuring up to 7 mm on image 17/3. With abrupt narrowing at the ampulla. Peribiliary enhancement most notably involving the pancreatic portions of the common bile duct and at the ampulla on image 40/21. No discrete intraluminal filling defect visualized within the bile ducts. Pancreas: Edematous appearance of the uncinate process/head of the pancreas  with adjacent peripancreatic stranding, no walled off collection. No pancreatic hypoenhancement. Probable pancreatic divisum. No pancreatic ductal dilation. Spleen:  Within normal limits in size and appearance. Adrenals/Urinary Tract: Bilateral adrenal glands are unremarkable. Bilateral renal cysts. No suspicious enhancing renal lesions. Stomach/Bowel: Stomach is grossly unremarkable. There is mild hyperenhancement and wall thickening of the second part of duodenum. No evidence of bowel obstruction. Vascular/Lymphatic: No pathologically enlarged lymph nodes identified. No abdominal aortic aneurysm demonstrated. The portal splenic and hepatic veins appear patent. Other:  No walled off fluid collections. Musculoskeletal: No suspicious bone lesions identified. IMPRESSION: 1. Acute interstitial pancreatitis involving the uncinate process and head of the pancreas, no walled off collection or evidence of necrosis. 2. Mild prominence of the intra and extrahepatic biliary tree with abrupt narrowing at the ampulla with peribiliary enhancement most  notably involving the pancreatic portions of the common bile duct and at the ampulla. No choledocholithiasis or discrete enhancing intraluminal filling defect visualized. Findings which may represent sequela of recently passed stone however given the narrowing and enhancement ampullary pathology is also a consideration, this could be further evaluated with ERCP. 3. Dilated gallbladder with pericholecystic fluid findings which are likely reactive. However, acute cholecystitis not completely excluded. 4. Probable pancreatic divisum. Electronically Signed   By: Dahlia Bailiff MD   On: 05/16/2020 21:55   US ABDOMEN LIMITED RUQ (LIVER/GB)  Result Date: 05/16/2020 CLINICAL DATA:  60 year old female with RIGHT UPPER quadrant abdominal pain for 1 day. EXAM: ULTRASOUND ABDOMEN LIMITED RIGHT UPPER QUADRANT COMPARISON:  02/12/2016 CT and 02/25/2015 ultrasound FINDINGS: Gallbladder: The gallbladder is unremarkable. There is no evidence of cholelithiasis, gallbladder wall thickening or sonographic Murphy sign. A trace amount of pericholecystic fluid is noted. Common bile duct: Diameter: 9 mm-distended. The LOWER CBD is not visualized. No intrahepatic biliary dilatation identified. Liver: No focal lesion identified. Within normal limits in parenchymal echogenicity. Portal vein is patent on color Doppler imaging with normal direction of blood flow towards the liver. Other: None. IMPRESSION: 1. CBD dilatation - CBD obstruction is not excluded. An obstructing cause is not identified, but the LOWER CBD is not visualized. 2. No evidence of cholelithiasis or gallbladder wall thickening. 3. Unremarkable liver. Electronically Signed   By: Margarette Canada M.D.   On: 05/16/2020 16:20        Scheduled Meds: . pantoprazole (PROTONIX) IV  40 mg Intravenous Q12H   Continuous Infusions: . lactated ringers 100 mL/hr at 05/18/20 1241  . meropenem (MERREM) IV 1 g (05/18/20 0513)     LOS: 2 days    Time spent: 35 minutes.      Elmarie Shiley, MD [facility]   If 7PM-7AM, please contact night-coverage [email or web address]  05/18/2020, 3:28 PM

## 2020-05-19 ENCOUNTER — Other Ambulatory Visit: Payer: Self-pay | Admitting: Internal Medicine

## 2020-05-19 ENCOUNTER — Encounter: Payer: Self-pay | Admitting: Family Medicine

## 2020-05-19 DIAGNOSIS — K851 Biliary acute pancreatitis without necrosis or infection: Secondary | ICD-10-CM | POA: Diagnosis not present

## 2020-05-19 LAB — GLUCOSE, CAPILLARY: Glucose-Capillary: 156 mg/dL — ABNORMAL HIGH (ref 70–99)

## 2020-05-19 LAB — COMPREHENSIVE METABOLIC PANEL
ALT: 474 U/L — ABNORMAL HIGH (ref 0–44)
AST: 115 U/L — ABNORMAL HIGH (ref 15–41)
Albumin: 3.4 g/dL — ABNORMAL LOW (ref 3.5–5.0)
Alkaline Phosphatase: 167 U/L — ABNORMAL HIGH (ref 38–126)
Anion gap: 6 (ref 5–15)
BUN: 9 mg/dL (ref 6–20)
CO2: 30 mmol/L (ref 22–32)
Calcium: 8.7 mg/dL — ABNORMAL LOW (ref 8.9–10.3)
Chloride: 103 mmol/L (ref 98–111)
Creatinine, Ser: 0.63 mg/dL (ref 0.44–1.00)
GFR, Estimated: >60 mL/min (ref >60)
Glucose, Bld: 90 mg/dL (ref 70–99)
Potassium: 3.5 mmol/L (ref 3.5–5.1)
Sodium: 139 mmol/L (ref 135–145)
Total Bilirubin: 1.2 mg/dL (ref 0.3–1.2)
Total Protein: 6 g/dL — ABNORMAL LOW (ref 6.5–8.1)

## 2020-05-19 LAB — SURGICAL PATHOLOGY

## 2020-05-19 LAB — CBC
HCT: 38.8 % (ref 36.0–46.0)
Hemoglobin: 13.2 g/dL (ref 12.0–15.0)
MCH: 32.1 pg (ref 26.0–34.0)
MCHC: 34 g/dL (ref 30.0–36.0)
MCV: 94.4 fL (ref 80.0–100.0)
Platelets: 172 10*3/uL (ref 150–400)
RBC: 4.11 MIL/uL (ref 3.87–5.11)
RDW: 13.1 % (ref 11.5–15.5)
WBC: 12.4 10*3/uL — ABNORMAL HIGH (ref 4.0–10.5)
nRBC: 0 % (ref 0.0–0.2)

## 2020-05-19 MED ORDER — ONDANSETRON HCL 4 MG PO TABS: 4.0000 mg | ORAL_TABLET | Freq: Four times a day (QID) | ORAL | 0 refills | Status: DC | PRN

## 2020-05-19 MED ORDER — METRONIDAZOLE 500 MG PO TABS: 500.0000 mg | ORAL_TABLET | Freq: Three times a day (TID) | ORAL | 0 refills | Status: DC

## 2020-05-19 MED ORDER — POLYETHYLENE GLYCOL 3350 17 G PO PACK: 17.0000 g | PACK | Freq: Every day | ORAL | Status: DC | PRN

## 2020-05-19 MED ORDER — CEPHALEXIN 500 MG PO CAPS: 500.0000 mg | ORAL_CAPSULE | Freq: Three times a day (TID) | ORAL | 0 refills | Status: DC

## 2020-05-19 MED ORDER — POLYETHYLENE GLYCOL 3350 17 G PO PACK: 17.0000 g | PACK | Freq: Every day | ORAL | 0 refills | Status: DC | PRN

## 2020-05-19 MED ORDER — POTASSIUM CHLORIDE CRYS ER 20 MEQ PO TBCR
40.0000 meq | EXTENDED_RELEASE_TABLET | Freq: Once | ORAL | Status: AC
  Administered 2020-05-19: 40 meq via ORAL
  Filled 2020-05-19: qty 2

## 2020-05-19 MED ORDER — OXYCODONE HCL 5 MG PO TABS: 5.0000 mg | ORAL_TABLET | ORAL | 0 refills | Status: DC | PRN

## 2020-05-19 MED ORDER — IBUPROFEN 800 MG PO TABS: 800.0000 mg | ORAL_TABLET | Freq: Three times a day (TID) | ORAL | 0 refills | Status: DC | PRN

## 2020-05-19 NOTE — Discharge Summary (Signed)
Physician Discharge Summary  Ashley Contreras KYH:[id number] DOB: 1960/09/18 DOA: 05/16/2020  PCP: Jerrol Banana., MD  Admit date: 05/16/2020 Discharge date: 05/19/2020  Admitted From: Home  Disposition: Home   Recommendations for Outpatient Follow-up:  1. Follow up with PCP in 1-2 weeks 2. Please obtain BMP/CBC in one week 3. Needs Repeat LFT.  4. Needs to follow up with GI post ERCP and will need Stent removal.     Discharge Condition: Stable.  CODE STATUS: Full code Diet recommendation: Heart Healthy/Low fat diet  Brief/Interim Summary: 60 year old with past medical history significant for gallstone, GERD, fatty liver, asthma, shingles who presents to the ED complaining of right upper quadrant abdominal pain, 10 out of 10, associated with nausea and vomiting.  Symptoms started the day prior to admission but got worse the day of admission.  Abdominal pain is exacerbated by activity and after meals.  Patient was found to have gallstone cryptitis, lipase more than 600, transaminases alkaline phosphatase 106, AST 196, ALT 268.  Chest x-ray negative.  Right upper quadrant abdominal ultrasound showed CBD dilation.  No evidence of cholelithiasis of gallbladder wall thickening.  MRCP: Showed acute interstitial pancreatitis, mild prominence of the intra and extrahepatic biliary tree with abrupt narrowing of the ampulla with peribiliary enhancement, most notably involving the pancreatic portion of the common bile duct and the ampulla.  No evidence of choledocholithiasis but suggestion for ERCP to better evaluate.   1-Acute Pancreatitis: Gallstone pancreatitis.  - MRCP showed mild prominence of  the intra and extrahepatic biliary tree with abrupt narrowing of the ampulla with peribiliary enhancement, most notably involving the pancreatic portion of the common bile duct and the ampulla. No choledocholithiasis, will need ERCP to further evaluate.  -Continue with IV fluids. Will give IV  bolus. Increase IV fluids rate.  -[place]; CBD dilation, CBD obstruction is not excluded.  An obstructing gauze is not a 35, but the lower CBD is not visualized.  No evidence of cholelithiasis or gallbladder wall thickening. -Underwent ERCP; extraction of one stone and s/p Stent placement.  - Lipid panel triglyceride at 70.  -received IV Protonix.   -Blood cultures: no growth -Underwent cholecystectomy 3-28 -LFT trending down.  -She is feeling well, had mild shoulder pain around 4 ma. She was able to tolerates breakfast.  -Plan to discharge on 5 days oral antibiotics keflex and flagyl because she has Stent placement.   2-Transaminases;  Secondary to obstructive etiology.  GI consulted.  Viral hepatitis panel negative LFT trending down.   3-Leukocytosis:  Could be reactive, but will cover for intra abdominal process, cholangitis in setting transaminases.  Started  IV Meropenem. (Patient has pennincilin allergies) Mild elevation today post sx, reactive.   4-Hyperglycemia:  HbA1c. 5.3, normal range.  Monitor CBG With hyperglycemia related to IV fluids, and acute pancreatitis CBG down to 90.   Hypokalemia;  Replaced.    Discharge Diagnoses:  Principal Problem:   Acute gallstone pancreatitis Active Problems:   GERD (gastroesophageal reflux disease)   Chronic gastritis   Abnormal liver enzymes   Idiopathic acute pancreatitis without infection or necrosis   Jaundice   Elevated liver enzymes   Disorder of common bile duct    Discharge Instructions  Discharge Instructions    Diet - low sodium heart healthy   Complete by: As directed    Discharge wound care:   Complete by: As directed    See AVS   Increase activity slowly   Complete by: As directed  Allergies as of 05/19/2020      Reactions   Penicillins Swelling   Cinnamon Swelling   Mouth and tongue become raw Swelling of tongue Swelling of tongue Mouth and tongue become raw      Medication List    TAKE  these medications   acetaminophen 500 MG tablet Commonly known as: TYLENOL Take 500 mg by mouth every 4 (four) hours as needed.   calcium-vitamin D 500-200 MG-UNIT Tabs tablet Commonly known as: OSCAL WITH D Take by mouth.   cephALEXin 500 MG capsule Commonly known as: KEFLEX Take 1 capsule (500 mg total) by mouth 3 (three) times daily for 5 days.   estradiol 0.0375 MG/24HR Commonly known as: VIVELLE-DOT PLACE 1 PATCH ONTO THE SKIN 2 TIMES A WEEK.   fluticasone 50 MCG/ACT nasal spray Commonly known as: FLONASE INSTILL 2 SPRAYS INTO BOTH NOSTRILS DAILY   hydrocortisone-pramoxine 2.5-1 % rectal cream Commonly known as: ANALPRAM-HC Place rectally 3 (three) times daily. Prn sx   ibuprofen 800 MG tablet Commonly known as: ADVIL Take 1 tablet (800 mg total) by mouth every 8 (eight) hours as needed.   loratadine 10 MG tablet Commonly known as: CLARITIN Take 10 mg by mouth daily.   metroNIDAZOLE 500 MG tablet Commonly known as: Flagyl Take 1 tablet (500 mg total) by mouth 3 (three) times daily for 5 days.   ondansetron 4 MG tablet Commonly known as: ZOFRAN Take 1 tablet (4 mg total) by mouth every 6 (six) hours as needed for nausea.   oxyCODONE 5 MG immediate release tablet Commonly known as: Oxy IR/ROXICODONE Take 1 tablet (5 mg total) by mouth every 4 (four) hours as needed for severe pain.   pantoprazole 40 MG tablet Commonly known as: PROTONIX TAKE 1 TABLET BY MOUTH 2 TIMES DAILY.   polyethylene glycol 17 g packet Commonly known as: MIRALAX / GLYCOLAX Take 17 g by mouth daily as needed for moderate constipation.   PRESERVISION AREDS 2 PO Take by mouth 2 (two) times daily.   sucralfate 1 g tablet Commonly known as: CARAFATE TAKE 1 TABLET BY MOUTH 4 TIMES DAILY - WITH MEALS AND AT BEDTIME. What changed: See the new instructions.   triamcinolone 0.1 % Commonly known as: KENALOG Apply 1-2 times daily to affected areas as needed for itch. Avoid applying to face,  groin, and axilla. Use as directed. Risk of skin atrophy with long-term use reviewed.   valACYclovir 1000 MG tablet Commonly known as: VALTREX TAKE 2 TABLETS AT THE ONSET OF A FEVER BLISTER AND THEN TAKE 2 TABLETS 12 HOURS LATER. AS NEEDED What changed: See the new instructions.            Discharge Care Instructions  (From admission, onward)         Start     Ordered   05/19/20 0000  Discharge wound care:       Comments: See AVS   05/19/20 1017          Follow-up Information    Tylene Fantasia, PA-C. Go on 06/04/2020.   Specialty: Physician Assistant Why: s/p lap cholecystectomy 10:45am appointment Contact information: [address] [place] [address] [phone]        Lucilla Lame, MD. Go on 06/11/2020.   Specialty: Gastroenterology Why: 3:30 Contact information: [address] [place]  [place] [address] [phone]              Allergies  Allergen Reactions  . Penicillins Swelling  . Cinnamon Swelling  Mouth and tongue become raw Swelling of tongue Swelling of tongue Mouth and tongue become raw     Consultations:  GI  Surgery    Procedures/Studies: DG Chest 2 View  Result Date: 05/16/2020 CLINICAL DATA:  Chest pain EXAM: CHEST - 2 VIEW COMPARISON:  09/18/2012 FINDINGS: The heart size and mediastinal contours are within normal limits. Both lungs are clear. The visualized skeletal structures are unremarkable. IMPRESSION: No active cardiopulmonary disease. Electronically Signed   By: Davina Poke D.O.   On: 05/16/2020 15:33   MR 3D Recon At Scanner  Result Date: 05/16/2020 CLINICAL DATA:  Concern for choledocholithiasis. EXAM: MRI ABDOMEN WITHOUT AND WITH CONTRAST (INCLUDING MRCP) TECHNIQUE: Multiplanar multisequence MR imaging of the abdomen was performed both before and after the administration of intravenous contrast. Heavily T2-weighted images of the biliary and pancreatic ducts were obtained, and three-dimensional MRCP  images were rendered by post processing. CONTRAST:  7.27mL GADAVIST GADOBUTROL 1 MMOL/ML IV SOLN COMPARISON:  Same-day right upper quadrant ultrasound. CT abdomen pelvis February 12, 2016. FINDINGS: Lower chest: No acute finding. Hepatobiliary: No hepatic steatosis. Tiny bilobar hepatic cysts the largest of which is in the inferior aspect of the right lobe of the liver measuring 7 mm on image 17/3. No suspicious hepatic hepatic lesions. Gallbladder is dilated with pericholecystic fluid. No cholelithiasis. There is prominence of the intra and extrahepatic biliary tree with the common duct measuring up to 7 mm on image 17/3. With abrupt narrowing at the ampulla. Peribiliary enhancement most notably involving the pancreatic portions of the common bile duct and at the ampulla on image 40/21. No discrete intraluminal filling defect visualized within the bile ducts. Pancreas: Edematous appearance of the uncinate process/head of the pancreas with adjacent peripancreatic stranding, no walled off collection. No pancreatic hypoenhancement. Probable pancreatic divisum. No pancreatic ductal dilation. Spleen:  Within normal limits in size and appearance. Adrenals/Urinary Tract: Bilateral adrenal glands are unremarkable. Bilateral renal cysts. No suspicious enhancing renal lesions. Stomach/Bowel: Stomach is grossly unremarkable. There is mild hyperenhancement and wall thickening of the second part of duodenum. No evidence of bowel obstruction. Vascular/Lymphatic: No pathologically enlarged lymph nodes identified. No abdominal aortic aneurysm demonstrated. The portal splenic and hepatic veins appear patent. Other:  No walled off fluid collections. Musculoskeletal: No suspicious bone lesions identified. IMPRESSION: 1. Acute interstitial pancreatitis involving the uncinate process and head of the pancreas, no walled off collection or evidence of necrosis. 2. Mild prominence of the intra and extrahepatic biliary tree with abrupt  narrowing at the ampulla with peribiliary enhancement most notably involving the pancreatic portions of the common bile duct and at the ampulla. No choledocholithiasis or discrete enhancing intraluminal filling defect visualized. Findings which may represent sequela of recently passed stone however given the narrowing and enhancement ampullary pathology is also a consideration, this could be further evaluated with ERCP. 3. Dilated gallbladder with pericholecystic fluid findings which are likely reactive. However, acute cholecystitis not completely excluded. 4. Probable pancreatic divisum. Electronically Signed   By: Dahlia Bailiff MD   On: 05/16/2020 21:55   DG C-Arm 1-60 Min-No Report  Result Date: 05/17/2020 Fluoroscopy was utilized by the requesting physician.  No radiographic interpretation.   MR ABDOMEN MRCP W WO CONTAST  Result Date: 05/16/2020 CLINICAL DATA:  Concern for choledocholithiasis. EXAM: MRI ABDOMEN WITHOUT AND WITH CONTRAST (INCLUDING MRCP) TECHNIQUE: Multiplanar multisequence MR imaging of the abdomen was performed both before and after the administration of intravenous contrast. Heavily T2-weighted images of the biliary and pancreatic ducts were  obtained, and three-dimensional MRCP images were rendered by post processing. CONTRAST:  7.61mL GADAVIST GADOBUTROL 1 MMOL/ML IV SOLN COMPARISON:  Same-day right upper quadrant ultrasound. CT abdomen pelvis February 12, 2016. FINDINGS: Lower chest: No acute finding. Hepatobiliary: No hepatic steatosis. Tiny bilobar hepatic cysts the largest of which is in the inferior aspect of the right lobe of the liver measuring 7 mm on image 17/3. No suspicious hepatic hepatic lesions. Gallbladder is dilated with pericholecystic fluid. No cholelithiasis. There is prominence of the intra and extrahepatic biliary tree with the common duct measuring up to 7 mm on image 17/3. With abrupt narrowing at the ampulla. Peribiliary enhancement most notably involving the  pancreatic portions of the common bile duct and at the ampulla on image 40/21. No discrete intraluminal filling defect visualized within the bile ducts. Pancreas: Edematous appearance of the uncinate process/head of the pancreas with adjacent peripancreatic stranding, no walled off collection. No pancreatic hypoenhancement. Probable pancreatic divisum. No pancreatic ductal dilation. Spleen:  Within normal limits in size and appearance. Adrenals/Urinary Tract: Bilateral adrenal glands are unremarkable. Bilateral renal cysts. No suspicious enhancing renal lesions. Stomach/Bowel: Stomach is grossly unremarkable. There is mild hyperenhancement and wall thickening of the second part of duodenum. No evidence of bowel obstruction. Vascular/Lymphatic: No pathologically enlarged lymph nodes identified. No abdominal aortic aneurysm demonstrated. The portal splenic and hepatic veins appear patent. Other:  No walled off fluid collections. Musculoskeletal: No suspicious bone lesions identified. IMPRESSION: 1. Acute interstitial pancreatitis involving the uncinate process and head of the pancreas, no walled off collection or evidence of necrosis. 2. Mild prominence of the intra and extrahepatic biliary tree with abrupt narrowing at the ampulla with peribiliary enhancement most notably involving the pancreatic portions of the common bile duct and at the ampulla. No choledocholithiasis or discrete enhancing intraluminal filling defect visualized. Findings which may represent sequela of recently passed stone however given the narrowing and enhancement ampullary pathology is also a consideration, this could be further evaluated with ERCP. 3. Dilated gallbladder with pericholecystic fluid findings which are likely reactive. However, acute cholecystitis not completely excluded. 4. Probable pancreatic divisum. Electronically Signed   By: Dahlia Bailiff MD   On: 05/16/2020 21:55   US ABDOMEN LIMITED RUQ (LIVER/GB)  Result Date:  05/16/2020 CLINICAL DATA:  60 year old female with RIGHT UPPER quadrant abdominal pain for 1 day. EXAM: ULTRASOUND ABDOMEN LIMITED RIGHT UPPER QUADRANT COMPARISON:  02/12/2016 CT and 02/25/2015 ultrasound FINDINGS: Gallbladder: The gallbladder is unremarkable. There is no evidence of cholelithiasis, gallbladder wall thickening or sonographic Murphy sign. A trace amount of pericholecystic fluid is noted. Common bile duct: Diameter: 9 mm-distended. The LOWER CBD is not visualized. No intrahepatic biliary dilatation identified. Liver: No focal lesion identified. Within normal limits in parenchymal echogenicity. Portal vein is patent on color Doppler imaging with normal direction of blood flow towards the liver. Other: None. IMPRESSION: 1. CBD dilatation - CBD obstruction is not excluded. An obstructing cause is not identified, but the LOWER CBD is not visualized. 2. No evidence of cholelithiasis or gallbladder wall thickening. 3. Unremarkable liver. Electronically Signed   By: Margarette Canada M.D.   On: 05/16/2020 16:20    Subjective: She is feeling better, mild abdominal soreness.   Discharge Exam: Vitals:   05/19/20 0516 05/19/20 0748  BP: 129/68 105/62  Pulse: 64 79  Resp: 18 16  Temp: 98.2 F (36.8 C) 97.8 F (36.6 C)  SpO2: 96% 99%     General: Pt is alert, awake, not in acute distress  Cardiovascular: RRR, S1/S2 +, no rubs, no gallops Respiratory: CTA bilaterally, no wheezing, no rhonchi Abdominal: Soft, NT, ND, bowel sounds + three small incision healing.  Extremities: no edema, no cyanosis    The results of significant diagnostics from this hospitalization (including imaging, microbiology, ancillary and laboratory) are listed below for reference.     Microbiology: Recent Results (from the past 240 hour(s))  Resp Panel by RT-PCR (Flu A&B, Covid) Nasopharyngeal Swab     Status: None   Collection Time: 05/16/20  6:23 PM   Specimen: Nasopharyngeal Swab; Nasopharyngeal(NP) swabs in vial  transport medium  Result Value Ref Range Status   SARS Coronavirus 2 by RT PCR NEGATIVE NEGATIVE Final    Comment: (NOTE) SARS-CoV-2 target nucleic acids are NOT DETECTED.  The SARS-CoV-2 RNA is generally detectable in upper respiratory specimens during the acute phase of infection. The lowest concentration of SARS-CoV-2 viral copies this assay can detect is 138 copies/mL. A negative result does not preclude SARS-Cov-2 infection and should not be used as the sole basis for treatment or other patient management decisions. A negative result may occur with  improper specimen collection/handling, submission of specimen other than nasopharyngeal swab, presence of viral mutation(s) within the areas targeted by this assay, and inadequate number of viral copies(<138 copies/mL). A negative result must be combined with clinical observations, patient history, and epidemiological information. The expected result is Negative.  Fact Sheet for Patients:  EntrepreneurPulse.com.au  Fact Sheet for Healthcare Providers:  IncredibleEmployment.be  This test is no t yet approved or cleared by the [place] FDA and  has been authorized for detection and/or diagnosis of SARS-CoV-2 by FDA under an Emergency Use Authorization (EUA). This EUA will remain  in effect (meaning this test can be used) for the duration of the COVID-19 declaration under Section 564(b)(1) of the Act, 21 U.S.C.section 360bbb-3(b)(1), unless the authorization is terminated  or revoked sooner.       Influenza A by PCR NEGATIVE NEGATIVE Final   Influenza B by PCR NEGATIVE NEGATIVE Final    Comment: (NOTE) The Xpert Xpress SARS-CoV-2/FLU/RSV plus assay is intended as an aid in the diagnosis of influenza from Nasopharyngeal swab specimens and should not be used as a sole basis for treatment. Nasal washings and aspirates are unacceptable for Xpert Xpress SARS-CoV-2/FLU/RSV testing.  Fact  Sheet for Patients: EntrepreneurPulse.com.au  Fact Sheet for Healthcare Providers: IncredibleEmployment.be  This test is not yet approved or cleared by the [place] FDA and has been authorized for detection and/or diagnosis of SARS-CoV-2 by FDA under an Emergency Use Authorization (EUA). This EUA will remain in effect (meaning this test can be used) for the duration of the COVID-19 declaration under Section 564(b)(1) of the Act, 21 U.S.C. section 360bbb-3(b)(1), unless the authorization is terminated or revoked.  Performed at [facility], [address] [place], [place] [address]   CULTURE, BLOOD (ROUTINE X 2) w Reflex to ID Panel     Status: None (Preliminary result)   Collection Time: 05/17/20  7:48 AM   Specimen: BLOOD  Result Value Ref Range Status   Specimen Description BLOOD RIGHT ARM  Final   Special Requests   Final    BOTTLES DRAWN AEROBIC AND ANAEROBIC Blood Culture adequate volume   Culture   Final    NO GROWTH 2 DAYS Performed at [facility], [address] [place], [place] [address]    Report Status PENDING  Incomplete  CULTURE, BLOOD (ROUTINE X 2) w Reflex to ID  Panel     Status: None (Preliminary result)   Collection Time: 05/17/20  7:48 AM   Specimen: BLOOD  Result Value Ref Range Status   Specimen Description BLOOD RIGHT HAND  Final   Special Requests   Final    BOTTLES DRAWN AEROBIC AND ANAEROBIC Blood Culture results may not be optimal due to an inadequate volume of blood received in culture bottles   Culture   Final    NO GROWTH 2 DAYS Performed at [facility], [address] [place], [place] [address]    Report Status PENDING  Incomplete     Labs: BNP (last 3 results) No results for input(s): BNP in the last 8760 hours. Basic Metabolic Panel: Recent Labs  Lab 05/16/20 1445 05/17/20 0525 05/18/20 0550 05/19/20 0857  NA 136 138 140 139  K 3.9 3.8 3.4* 3.5  CL 105 106  109 103  CO2 23 22 26 30   GLUCOSE 114* 291* 100* 90  BUN 16 11 7 9   CREATININE 0.59 0.45 0.53 0.63  CALCIUM 9.0 8.8* 8.7* 8.7*   Liver Function Tests: Recent Labs  Lab 05/16/20 1445 05/17/20 0525 05/18/20 0550 05/19/20 0857  AST 196* 1,105* 303* 115*  ALT 268* 856* 742* 474*  ALKPHOS 106 180* 188* 167*  BILITOT 1.5* 7.5* 1.5* 1.2  PROT 7.1 7.0 5.5* 6.0*  ALBUMIN 4.3 4.2 3.2* 3.4*   Recent Labs  Lab 05/16/20 1445 05/17/20 0525 05/18/20 0550  LIPASE 624* 271* 129*   No results for input(s): AMMONIA in the last 168 hours. CBC: Recent Labs  Lab 05/16/20 1445 05/17/20 0525 05/18/20 0550 05/19/20 0857  WBC 10.0 17.9* 9.9 12.4*  HGB 15.3* 15.7* 12.8 13.2  HCT 44.9 46.1* 37.4 38.8  MCV 95.1 93.9 93.5 94.4  PLT 217 217 182 172   Cardiac Enzymes: No results for input(s): CKTOTAL, CKMB, CKMBINDEX, TROPONINI in the last 168 hours. BNP: Invalid input(s): POCBNP CBG: Recent Labs  Lab 05/17/20 2042 05/18/20 0759 05/18/20 1602 05/18/20 2115 05/19/20 0745  GLUCAP 159* 93 212* 140* 156*   D-Dimer No results for input(s): DDIMER in the last 72 hours. Hgb A1c Recent Labs    05/17/20 0525  HGBA1C 5.3   Lipid Profile Recent Labs    05/17/20 0525  CHOL 208*  HDL 76  LDLCALC 118*  TRIG 71  CHOLHDL 2.7   Thyroid function studies No results for input(s): TSH, T4TOTAL, T3FREE, THYROIDAB in the last 72 hours.  Invalid input(s): FREET3 Anemia work up No results for input(s): VITAMINB12, FOLATE, FERRITIN, TIBC, IRON, RETICCTPCT in the last 72 hours. Urinalysis    Component Value Date/Time   COLORURINE YELLOW 12/05/2013 0911   APPEARANCEUR CLEAR 12/05/2013 0911   APPEARANCEUR Clear 09/18/2012 1010   LABSPEC >=1.030 (A) 12/05/2013 0911   LABSPEC 1.017 09/18/2012 1010   PHURINE 5.5 12/05/2013 0911   GLUCOSEU negative 06/28/2018 1138   GLUCOSEU NEGATIVE 12/05/2013 0911   HGBUR SMALL (A) 12/05/2013 0911   BILIRUBINUR negative 07/04/2019 1210   BILIRUBINUR  Negative 09/18/2012 1010   KETONESUR NEGATIVE 12/05/2013 0911   PROTEINUR Negative 07/04/2019 1210   PROTEINUR NEG 09/30/2013 1619   UROBILINOGEN 0.2 07/04/2019 1210   UROBILINOGEN 0.2 12/05/2013 0911   NITRITE negative 07/04/2019 1210   NITRITE NEGATIVE 12/05/2013 0911   LEUKOCYTESUR Negative 07/04/2019 1210   LEUKOCYTESUR Negative 09/18/2012 1010   Sepsis Labs Invalid input(s): PROCALCITONIN,  WBC,  LACTICIDVEN Microbiology Recent Results (from the past 240 hour(s))  Resp Panel by RT-PCR (Flu A&B,  Covid) Nasopharyngeal Swab     Status: None   Collection Time: 05/16/20  6:23 PM   Specimen: Nasopharyngeal Swab; Nasopharyngeal(NP) swabs in vial transport medium  Result Value Ref Range Status   SARS Coronavirus 2 by RT PCR NEGATIVE NEGATIVE Final    Comment: (NOTE) SARS-CoV-2 target nucleic acids are NOT DETECTED.  The SARS-CoV-2 RNA is generally detectable in upper respiratory specimens during the acute phase of infection. The lowest concentration of SARS-CoV-2 viral copies this assay can detect is 138 copies/mL. A negative result does not preclude SARS-Cov-2 infection and should not be used as the sole basis for treatment or other patient management decisions. A negative result may occur with  improper specimen collection/handling, submission of specimen other than nasopharyngeal swab, presence of viral mutation(s) within the areas targeted by this assay, and inadequate number of viral copies(<138 copies/mL). A negative result must be combined with clinical observations, patient history, and epidemiological information. The expected result is Negative.  Fact Sheet for Patients:  EntrepreneurPulse.com.au  Fact Sheet for Healthcare Providers:  IncredibleEmployment.be  This test is no t yet approved or cleared by the [place] FDA and  has been authorized for detection and/or diagnosis of SARS-CoV-2 by FDA under an Emergency Use  Authorization (EUA). This EUA will remain  in effect (meaning this test can be used) for the duration of the COVID-19 declaration under Section 564(b)(1) of the Act, 21 U.S.C.section 360bbb-3(b)(1), unless the authorization is terminated  or revoked sooner.       Influenza A by PCR NEGATIVE NEGATIVE Final   Influenza B by PCR NEGATIVE NEGATIVE Final    Comment: (NOTE) The Xpert Xpress SARS-CoV-2/FLU/RSV plus assay is intended as an aid in the diagnosis of influenza from Nasopharyngeal swab specimens and should not be used as a sole basis for treatment. Nasal washings and aspirates are unacceptable for Xpert Xpress SARS-CoV-2/FLU/RSV testing.  Fact Sheet for Patients: EntrepreneurPulse.com.au  Fact Sheet for Healthcare Providers: IncredibleEmployment.be  This test is not yet approved or cleared by the [place] FDA and has been authorized for detection and/or diagnosis of SARS-CoV-2 by FDA under an Emergency Use Authorization (EUA). This EUA will remain in effect (meaning this test can be used) for the duration of the COVID-19 declaration under Section 564(b)(1) of the Act, 21 U.S.C. section 360bbb-3(b)(1), unless the authorization is terminated or revoked.  Performed at [facility] [facility], [address] [place], [place] [address]   CULTURE, BLOOD (ROUTINE X 2) w Reflex to ID Panel     Status: None (Preliminary result)   Collection Time: 05/17/20  7:48 AM   Specimen: BLOOD  Result Value Ref Range Status   Specimen Description BLOOD RIGHT ARM  Final   Special Requests   Final    BOTTLES DRAWN AEROBIC AND ANAEROBIC Blood Culture adequate volume   Culture   Final    NO GROWTH 2 DAYS Performed at [facility], [address] [place], [place] [address]    Report Status PENDING  Incomplete  CULTURE, BLOOD (ROUTINE X 2) w Reflex to ID Panel     Status: None (Preliminary result)   Collection Time: 05/17/20  7:48 AM    Specimen: BLOOD  Result Value Ref Range Status   Specimen Description BLOOD RIGHT HAND  Final   Special Requests   Final    BOTTLES DRAWN AEROBIC AND ANAEROBIC Blood Culture results may not be optimal due to an inadequate volume of blood received in culture bottles   Culture  Final    NO GROWTH 2 DAYS Performed at [place], [place] [address]    Report Status PENDING  Incomplete     Time coordinating discharge: 40 minutes  SIGNED:   Elmarie Shiley, MD  [facility]

## 2020-05-19 NOTE — Progress Notes (Signed)
[facility] Day(s): 3.   Post op day(s): 1 Day Post-Op.   Interval History:  Patient seen and examined No acute events or new complaints overnight.  Patient reports she has abdominal soreness and discomfort in her right shoulder No fever, chills, nausea, emesis No new labs or imaging She is advanced to regular diet this morning No other complaints   Vital signs in last 24 hours: [min-max] current  Temp:  [97.8 F (36.6 C)-98.9 F (37.2 C)] 98.2 F (36.8 C) (03/29 0516) Pulse Rate:  [64-107] 64 (03/29 0516) Resp:  [15-20] 18 (03/29 0516) BP: (110-150)/(65-91) 129/68 (03/29 0516) SpO2:  [94 %-100 %] 96 % (03/29 0516) Weight:  [65.8 kg] 65.8 kg (03/28 0938)     Height: 5\' 6"  (167.6 cm) Weight: 65.8 kg BMI (Calculated): 23.41   Intake/Output last 2 shifts:  03/28 0701 - 03/29 0700 In: 3004.5 [P.O.:236; I.V.:2445.2; IV Piggyback:323.2] Out: 10 [Blood:10]   Physical Exam:  Constitutional: alert, cooperative and no distress  Respiratory: breathing non-labored at rest  Cardiovascular: regular rate and sinus rhythm  Gastrointestinal: soft, incisional soreness, and non-distended, no rebound/guarding Integumentary: Laparoscopic incisions are CDI with dermabond, no erythema or drainage   Labs:  CBC Latest Ref Rng & Units 05/18/2020 05/17/2020 05/16/2020  WBC 4.0 - 10.5 K/uL 9.9 17.9(H) 10.0  Hemoglobin 12.0 - 15.0 g/dL 12.8 15.7(H) 15.3(H)  Hematocrit 36.0 - 46.0 % 37.4 46.1(H) 44.9  Platelets 150 - 400 K/uL 182 217 217   CMP Latest Ref Rng & Units 05/18/2020 05/17/2020 05/16/2020  Glucose 70 - 99 mg/dL 100(H) 291(H) 114(H)  BUN 6 - 20 mg/dL 7 11 16   Creatinine 0.44 - 1.00 mg/dL 0.53 0.45 0.59  Sodium 135 - 145 mmol/L 140 138 136  Potassium 3.5 - 5.1 mmol/L 3.4(L) 3.8 3.9  Chloride 98 - 111 mmol/L 109 106 105  CO2 22 - 32 mmol/L 26 22 23   Calcium 8.9 - 10.3 mg/dL 8.7(L) 8.8(L) 9.0  Total Protein 6.5 - 8.1 g/dL 5.5(L) 7.0 7.1   Total Bilirubin 0.3 - 1.2 mg/dL 1.5(H) 7.5(H) 1.5(H)  Alkaline Phos 38 - 126 U/L 188(H) 180(H) 106  AST 15 - 41 U/L 303(H) 1,105(H) 196(H)  ALT 0 - 44 U/L 742(H) 856(H) 268(H)    Imaging studies: No new pertinent imaging studies   Assessment/Plan: 60 y.o. female doing well 1 Day Post-Op s/p robotic assisted laparoscopic cholecystectomy for gallstone pancreatitis with acute cholecystitis.   - Okay to continue regular diet; will leave home dietary recommendation  - Continue IV Abx (Meropenem); transition to PO at home, 7 days   - Monitor abdominal examination    - Pain control prn; antiemetics prn             - Further management per primary service    - Discharge Planning: Okay for discharge from surgical standpoint, will update DC instructions and place follow up, will send pain medication Rx as well.    All of the above findings and recommendations were discussed with the patient, and the medical team, and all of patient's questions were answered to her expressed satisfaction.  -- Edison Simon, PA-C [facility] 05/19/2020, 7:44 AM [phone] M-F: 7am - 4pm

## 2020-05-19 NOTE — Discharge Instructions (Signed)
In addition to included general post-operative instructions,  Diet: Resume home diet. Recommend avoiding or limiting fatty/greasy foods over the next few days/week. If you do eat these, you may (or may not) notice diarrhea. This is expected while your body adjusts to not having a gallbladder, and it typically resolves with time.   Activity: No heavy lifting >20 pounds (children, pets, laundry, garbage) for 4 weeks, but light activity and walking are encouraged. Do not drive or drink alcohol if taking narcotic pain medications or having pain that might distract from driving.  Wound care: 2 days after surgery (03/30), you may shower/get incision wet with soapy water and pat dry (do not rub incisions), but no baths or submerging incision underwater until follow-up.   Medications: Resume all home medications. For mild to moderate pain: acetaminophen (Tylenol) or ibuprofen/naproxen (if no kidney disease). Combining Tylenol with alcohol can substantially increase your risk of causing liver disease. Narcotic pain medications, if prescribed, can be used for severe pain, though may cause nausea, constipation, and drowsiness. Do not combine Tylenol and Percocet (or similar) within a 6 hour period as Percocet (and similar) contain(s) Tylenol. If you do not need the narcotic pain medication, you do not need to fill the prescription.  Call office [phone] / [phone]) at any time if any questions, worsening pain, fevers/chills, bleeding, drainage from incision site, or other concerns.

## 2020-05-20 ENCOUNTER — Telehealth: Payer: Self-pay

## 2020-05-20 NOTE — Telephone Encounter (Signed)
No HFU scheduled at this time. 

## 2020-05-20 NOTE — Telephone Encounter (Signed)
Transition Care Management Follow-up Telephone Call  Date of discharge and from where: [facility] on 05/19/20  How have you been since you were released from the hospital? Doing good, just sore and moving around slowly. Patient has been eating bland foods but does not have much of an appetite. Current pain level is a 3-4 in her abdomen. Pt is taking Ibuprofen and Oxycodone as needed. Pt states her incision sites look good and declines redness, swelling or unusual drainage. Pt had two BMs today that were slightly loose. Pt states she slept ok last night and only woke up once. Declines fever, pain elsewhere, SOB or n/v/d.  Any questions or concerns? No   Items Reviewed:  Did the pt receive and understand the discharge instructions provided? Yes   Medications obtained and verified? No, declined verifying at this time.   Any new allergies since your discharge? No   Dietary orders reviewed? Yes  Do you have support at home? Yes   Other (ie: DME, Home Health, etc): N/A  Functional Questionnaire: (I = Independent and D = Dependent)  Bathing/Dressing- I   Meal Prep- I  Eating- I  Maintaining continence- I  Transferring/Ambulation- I  Managing Meds- I   Follow up appointments reviewed:    PCP Hospital f/u appt confirmed? Yes  scheduled to see Fenton Malling on 05/22/20 @ 3:00 PM.  [facility] f/u appt confirmed? Yes    Are transportation arrangements needed? No   If their condition worsens, is the pt aware to call  their PCP or go to the ED? Yes  Was the patient provided with contact information for the PCP's office or ED? Yes  Was the pt encouraged to call back with questions or concerns? Yes

## 2020-05-21 ENCOUNTER — Other Ambulatory Visit: Payer: Self-pay | Admitting: *Deleted

## 2020-05-21 NOTE — Patient Outreach (Signed)
[facility]) Care Management  05/21/2020  SHIORI ADCOX 12/17/1960 [id number]   Transition of care telephone call  Referral received:05/18/20 Initial outreach:05/21/20 Insurance: [facility]   Initial unsuccessful telephone call to patient's preferred number in order to complete transition of care assessment; no answer, left HIPAA compliant voicemail message requesting return call.   Objective:  Per electronic medical record, Mrs. Pricilla Loveless  was hospitalized at [facility] 3/26-3/29/22 for Acute pancreatitis, gall stone pancreatitis, s/p ERCP extraction of stone and stent placement, and 3/28 Laparoscopic assisted cholecstectomy  Comorbidities include: Gallstones, fatty liver disease  Cervical radiculopathy, GERD,  She was discharged to home on 05/19/20 without the need for home health services or DME.    Plan: This RNCM will route unsuccessful outreach letter with [facility] [employer] pamphlet and 24 hour Nurse Advice Line Magn[place] to [facility] clinical pool to be mailed to patient's home address. This RNCM will attempt another outreach within 4 business days.  Joylene Draft, RN, BSN  [facility] Management Coordinator  [phone]- Mobile [phone]- Toll Free Main Office

## 2020-05-22 ENCOUNTER — Other Ambulatory Visit: Payer: Self-pay

## 2020-05-22 ENCOUNTER — Encounter: Payer: Self-pay | Admitting: Physician Assistant

## 2020-05-22 ENCOUNTER — Other Ambulatory Visit: Payer: Self-pay | Admitting: Physician Assistant

## 2020-05-22 ENCOUNTER — Ambulatory Visit (INDEPENDENT_AMBULATORY_CARE_PROVIDER_SITE_OTHER): Payer: 59 | Admitting: Physician Assistant

## 2020-05-22 VITALS — BP 120/84 | HR 114 | Temp 98.5°F | Ht 66.0 in | Wt 139.1 lb

## 2020-05-22 DIAGNOSIS — K851 Biliary acute pancreatitis without necrosis or infection: Secondary | ICD-10-CM | POA: Diagnosis not present

## 2020-05-22 DIAGNOSIS — B379 Candidiasis, unspecified: Secondary | ICD-10-CM | POA: Diagnosis not present

## 2020-05-22 DIAGNOSIS — T3695XA Adverse effect of unspecified systemic antibiotic, initial encounter: Secondary | ICD-10-CM | POA: Diagnosis not present

## 2020-05-22 LAB — CULTURE, BLOOD (ROUTINE X 2)
Culture: NO GROWTH
Culture: NO GROWTH
Special Requests: ADEQUATE

## 2020-05-22 MED ORDER — TERCONAZOLE 0.4 % VA CREA: 1.0000 | TOPICAL_CREAM | Freq: Every day | VAGINAL | 0 refills | Status: DC

## 2020-05-22 NOTE — Patient Instructions (Signed)
Pancreatitis Eating Plan Pancreatitis is when your pancreas becomes irritated and swollen (inflamed). The pancreas is a small organ located behind your stomach. It helps your body digest food and regulate your blood sugar. Pancreatitis can affect how your body digests food, especially foods with fat. You may also have other symptoms such as abdominal pain or nausea. When you have pancreatitis, following a low-fat eating plan may help you manage symptoms and recover more quickly. Work with your health care provider or a diet and nutrition specialist (dietitian) to create an eating plan that is right for you. What are tips for following this plan? Reading food labels Use the information on food labels to help keep track of how much fat you eat:  Check the serving size.  Look for the amount of total fat in grams (g) in one serving. ? Low-fat foods have 3 g of fat or less per serving. ? Fat-free foods have 0.5 g of fat or less per serving.  Keep track of how much fat you eat based on how many servings you eat. ? For example, if you eat two servings, the amount of fat you eat will be two times what is listed on the label. Shopping  Buy low-fat or nonfat foods, such as: ? Fresh, frozen, or canned fruits and vegetables. ? Grains, including pasta, bread, and rice. ? Lean meat, poultry, fish, and other protein foods. ? Low-fat or nonfat dairy.  Avoid buying bakery products and other sweets made with whole milk, butter, and eggs.  Avoid buying snack foods with added fat, such as anything with butter or cheese flavoring.   Cooking  Remove skin from poultry, and remove extra fat from meat.  Limit the amount of fat and oil you use to 6 teaspoons or less per day.  Cook using low-fat methods, such as boiling, broiling, grilling, steaming, or baking.  Use spray oil to cook. Add fat-free chicken broth to add flavor and moisture.  Avoid adding cream to thicken soups or sauces. Use other thickeners  such as corn starch or tomato paste. Meal planning  Eat a low-fat diet as told by your dietitian. For most people, this means having no more than 55-65 grams of fat each day.  Eat small, frequent meals throughout the day. For example, you may have 5-6 small meals instead of 3 large meals.  Drink enough fluid to keep your urine pale yellow.  Do not drink alcohol. Talk to your health care provider if you need help stopping.  Limit how much caffeine you have, including black coffee, black and green tea, caffeinated soft drinks, and energy drinks.   General information  Let your health care provider or dietitian know if you have unplanned weight loss on this eating plan.  You may be instructed to follow a clear liquid diet during a flare of symptoms. Talk with your health care provider about how to manage your diet during symptoms of a flare.  Take any vitamins or supplements as told by your health care provider.  Work with a Microbiologist, especially if you have other conditions such as obesity or diabetes mellitus. What foods should I avoid? Fruits Fried fruits. Fruits served with butter or cream. Vegetables Fried vegetables. Vegetables cooked with butter, cheese, or cream. Grains Biscuits, waffles, donuts, pastries, and croissants. Pies and cookies. Butter-flavored popcorn. Regular crackers. Meats and other protein foods Fatty cuts of meat. Poultry with skin. Organ meats. Bacon, sausage, and cold cuts. Whole eggs. Nuts and nut butters. Dairy  Whole and 2% milk. Whole milk yogurt. Whole milk ice cream. Cream and half-and-half. Cream cheese. Sour cream. Cheese. Beverages Wine, beer, and liquor. The items listed above may not be a complete list of foods and beverages to avoid. Contact a dietitian for more information. Summary  Pancreatitis can affect how your body digests food, especially foods with fat.  When you have pancreatitis, it is recommended that you follow a low-fat eating  plan to help you recover more quickly and manage symptoms. For most people, this means limiting fat to no more than 55-65 grams per day.  Do not drink alcohol. Limit the amount of caffeine you have, and drink enough fluid to keep your urine pale yellow. This information is not intended to replace advice given to you by your health care provider. Make sure you discuss any questions you have with your health care provider. Document Revised: 05/31/2018 Document Reviewed: 05/16/2017 Elsevier Patient Education  2021 [employer] If you have a gallbladder condition, you may have trouble digesting fats. Eating a low-fat diet can help reduce your symptoms, and may be helpful before and after having surgery to remove your gallbladder (cholecystectomy). Your health care provider may recommend that you work with a diet and nutrition specialist (dietitian) to help you reduce the amount of fat in your diet. What are tips for following this plan? General guidelines  Limit your fat intake to less than 30% of your total daily calories. If you eat around 1,800 calories each day, this is less than 60 grams (g) of fat per day.  Fat is an important part of a healthy diet. Eating a low-fat diet can make it hard to maintain a healthy body weight. Ask your dietitian how much fat, calories, and other nutrients you need each day.  Eat small, frequent meals throughout the day instead of three large meals.  Drink at least 8-10 cups of fluid a day. Drink enough fluid to keep your urine clear or pale yellow.  Limit alcohol intake to no more than 1 drink a day for nonpregnant women and 2 drinks a day for men. One drink equals 12 oz of beer, 5 oz of wine, or 1 oz of hard liquor. Reading food labels  Check Nutrition Facts on food labels for the amount of fat per serving. Choose foods with less than 3 grams of fat per serving.   Shopping  Choose nonfat and low-fat healthy foods. Look for the  words "nonfat," "low fat," or "fat free."  Avoid buying processed or prepackaged foods. Cooking  Cook using low-fat methods, such as baking, broiling, grilling, or boiling.  Cook with small amounts of healthy fats, such as olive oil, grapeseed oil, canola oil, or sunflower oil. What foods are recommended?  All fresh, frozen, or canned fruits and vegetables.  Whole grains.  Low-fat or non-fat (skim) milk and yogurt.  Lean meat, skinless poultry, fish, eggs, and beans.  Low-fat protein supplement powders or drinks.  Spices and herbs. What foods are not recommended?  High-fat foods. These include baked goods, fast food, fatty cuts of meat, ice cream, french toast, sweet rolls, pizza, cheese bread, foods covered with butter, creamy sauces, or cheese.  Fried foods. These include french fries, tempura, battered fish, breaded chicken, fried breads, and sweets.  Foods with strong odors.  Foods that cause bloating and gas. Summary  A low-fat diet can be helpful if you have a gallbladder condition, or before and after gallbladder surgery.  Limit your fat intake to less than 30% of your total daily calories. This is about 60 g of fat if you eat 1,800 calories each day.  Eat small, frequent meals throughout the day instead of three large meals. This information is not intended to replace advice given to you by your health care provider. Make sure you discuss any questions you have with your health care provider. Document Revised: 09/26/2019 Document Reviewed: 09/26/2019 Elsevier Patient Education  2021 [employer].

## 2020-05-22 NOTE — Progress Notes (Signed)
Established patient visit   Patient: Ashley Contreras   DOB: 1960/03/19   60 y.o. Female  MRN: [id number] Visit Date: 05/22/2020  Today's healthcare provider: Mar Daring, PA-C   Chief Complaint  Patient presents with  . Hospitalization Follow-up  I,Porsha C McClurkin,acting as a scribe for [employer], PA-C.,have documented all relevant documentation on the behalf of Mar Daring, PA-C,as directed by  Mar Daring, PA-C while in the presence of Mar Daring, PA-C.  Subjective    HPI  Follow up Hospitalization  Patient was admitted to [facility] on 05/16/2020 and discharged on 05/19/2020. She was treated for Acute gallstone pancreatitis and removal of gallbladder. Treatment for this included: labs, abdominal ultrasound,EKG, x-ray. Telephone follow up was done on 05/20/2020. She reports good compliance with treatment. She reports this condition is improved.  ----------------------------------------------------------------------------------------- - Patient reports she noticed pain in her upper right abdominal area. She reports noticing after eating dinner last night. She reports eating tuna with mayo, noodles, peas and baked beans. Patient reports eating boiled eggs for breakfast today,05/22/20 and chicken noodle soup w/ crackers for lunch.   Patient Active Problem List   Diagnosis Date Noted  . Abnormal liver enzymes   . Idiopathic acute pancreatitis without infection or necrosis   . Jaundice   . Elevated liver enzymes   . Disorder of common bile duct   . Acute gallstone pancreatitis 05/16/2020  . Increased risk of breast cancer 11/11/2019  . External hemorrhoid 11/11/2019  . Family history of breast cancer 11/06/2018  . Special screening for malignant neoplasms, colon   . Fatty liver 04/29/2016  . Abdominal pain, chronic, right lower quadrant 01/29/2016  . Palpitations 01/29/2016  . Left breast mass 04/24/2015  . Subclinical  hyperthyroidism 04/24/2015  . Encounter for general adult medical examination with abnormal findings 11/02/2012  . Personal history of colonic polyps 10/29/2012  . GERD (gastroesophageal reflux disease) 09/21/2012  . Chronic gastritis 09/21/2012  . Dyspareunia 04/25/2012  . Cervical radiculopathy 11/03/2011  . Seasonal allergies 10/21/2011   Past Medical History:  Diagnosis Date  . Abnormal Pap smear 1989   s/p colposcopy  . Allergy    hay fever  . Arthritis   . Asthma   . BRCA negative 10/2018   MyRisk neg except ATM VUS  . Chicken pox   . Colon polyp   . Complication of anesthesia   . Family history of adverse reaction to anesthesia    sister - slow to wake  . Family history of breast cancer   . Fatty liver    seen by hepatology  . GERD (gastroesophageal reflux disease)   . Increased risk of breast cancer 10/2018   IBIS=24.7%/riskscore=15.8%  . Mitral valve prolapse   . PONV (postoperative nausea and vomiting)   . Shingles    waist, right side  . UTI (lower urinary tract infection)   . Wears contact lenses        Medications: Outpatient Medications Prior to Visit  Medication Sig  . calcium-vitamin D (OSCAL WITH D) 500-200 MG-UNIT TABS tablet Take by mouth.  . cephALEXin (KEFLEX) 500 MG capsule Take 1 capsule (500 mg total) by mouth 3 (three) times daily for 5 days.  Marland Kitchen estradiol (VIVELLE-DOT) 0.0375 MG/24HR PLACE 1 PATCH ONTO THE SKIN 2 TIMES A WEEK.  . fluticasone (FLONASE) 50 MCG/ACT nasal spray INSTILL 2 SPRAYS INTO BOTH NOSTRILS DAILY  . hydrocortisone-pramoxine (ANALPRAM-HC) 2.5-1 % rectal cream Place rectally 3 (three)  times daily. Prn sx  . ibuprofen (ADVIL) 800 MG tablet Take 1 tablet (800 mg total) by mouth every 8 (eight) hours as needed.  . loratadine (CLARITIN) 10 MG tablet Take 10 mg by mouth daily.  . metroNIDAZOLE (FLAGYL) 500 MG tablet Take 1 tablet (500 mg total) by mouth 3 (three) times daily for 5 days.  . Multiple Vitamins-Minerals (PRESERVISION  AREDS 2 PO) Take by mouth 2 (two) times daily.  . ondansetron (ZOFRAN) 4 MG tablet Take 1 tablet (4 mg total) by mouth every 6 (six) hours as needed for nausea.  Marland Kitchen oxyCODONE (OXY IR/ROXICODONE) 5 MG immediate release tablet Take 1 tablet (5 mg total) by mouth every 4 (four) hours as needed for severe pain.  . pantoprazole (PROTONIX) 40 MG tablet TAKE 1 TABLET BY MOUTH 2 TIMES DAILY. (Patient taking differently: Take 40 mg by mouth 2 (two) times daily.)  . polyethylene glycol (MIRALAX / GLYCOLAX) 17 g packet Take 17 g by mouth daily as needed for moderate constipation.  . sucralfate (CARAFATE) 1 g tablet TAKE 1 TABLET BY MOUTH 4 TIMES DAILY - WITH MEALS AND AT BEDTIME. (Patient taking differently: Take 1 g by mouth 4 (four) times daily.)  . triamcinolone cream (KENALOG) 0.1 % Apply 1-2 times daily to affected areas as needed for itch. Avoid applying to face, groin, and axilla. Use as directed. Risk of skin atrophy with long-term use reviewed.  . valACYclovir (VALTREX) 1000 MG tablet TAKE 2 TABLETS AT THE ONSET OF A FEVER BLISTER AND THEN TAKE 2 TABLETS 12 HOURS LATER. AS NEEDED (Patient taking differently: Take 2,000 mg by mouth 2 (two) times daily. Take 2 tablets at the onset of a fever blister and then take 2 tablets 12 hours later. As needed)  . acetaminophen (TYLENOL) 500 MG tablet Take 500 mg by mouth every 4 (four) hours as needed. (Patient not taking: Reported on 05/22/2020)   No facility-administered medications prior to visit.    Review of Systems  Constitutional: Negative.   Respiratory: Negative.   Cardiovascular: Negative.   Hematological: Negative.   Psychiatric/Behavioral: Negative.     Last CBC Lab Results  Component Value Date   WBC 11.5 (H) 05/22/2020   HGB 15.3 05/22/2020   HCT 45.3 05/22/2020   MCV 94 05/22/2020   MCH 31.7 05/22/2020   RDW 11.8 05/22/2020   PLT 256 83/66/2947   Last metabolic panel Lab Results  Component Value Date   GLUCOSE 103 (H) 05/22/2020   NA  139 05/22/2020   K 4.7 05/22/2020   CL 100 05/22/2020   CO2 24 05/22/2020   BUN 14 05/22/2020   CREATININE 0.76 05/22/2020   GFRNONAA >60 05/19/2020   GFRAA 112 07/04/2019   CALCIUM 9.2 05/22/2020   PROT 6.9 05/22/2020   ALBUMIN 4.2 05/22/2020   LABGLOB 2.7 05/22/2020   AGRATIO 1.6 05/22/2020   BILITOT 0.6 05/22/2020   ALKPHOS 187 (H) 05/22/2020   AST 41 (H) 05/22/2020   ALT 191 (H) 05/22/2020   ANIONGAP 6 05/19/2020       Objective    BP 120/84 (BP Location: Left Arm, Patient Position: Sitting, Cuff Size: Normal)   Pulse (!) 114   Temp 98.5 F (36.9 C) (Oral)   Ht _0  (1.676 m)   Wt 139 lb 1.6 oz (63.1 kg)   SpO2 98%   BMI 22.45 kg/m  BP Readings from Last 3 Encounters:  05/22/20 120/84  05/19/20 105/62  01/06/20 100/73   Wt Readings from Last  3 Encounters:  05/22/20 139 lb 1.6 oz (63.1 kg)  05/18/20 145 lb (65.8 kg)  01/06/20 146 lb (66.2 kg)       Physical Exam Constitutional:      General: She is not in acute distress.    Appearance: Normal appearance. She is well-developed. She is ill-appearing. She is not diaphoretic.  HENT:     Head: Normocephalic and atraumatic.  Cardiovascular:     Rate and Rhythm: Normal rate and regular rhythm.     Heart sounds: Normal heart sounds. No murmur heard. No friction rub. No gallop.   Pulmonary:     Effort: Pulmonary effort is normal. No respiratory distress.     Breath sounds: Normal breath sounds. No wheezing or rales.  Abdominal:     General: Abdomen is flat. Bowel sounds are normal. There is no distension.     Palpations: Abdomen is soft. There is no mass.     Tenderness: There is abdominal tenderness in the epigastric area. There is no guarding or rebound.  Skin:    General: Skin is warm and dry.  Neurological:     General: No focal deficit present.     Mental Status: She is alert and oriented to person, place, and time. Mental status is at baseline.  Psychiatric:        Mood and Affect: Mood normal.         Thought Content: Thought content normal.       No results found for any visits on 05/22/20.  Assessment & Plan     1. Antibiotic-induced yeast infection Diflucan given for yeast infection secondary to antibiotics in the hospital.   2. Acute gallstone pancreatitis Slowly improving but appetite still not great. Still with some pain after eating. Will check labs as below and f/u pending results. - CBC w/Diff/Platelet - Comprehensive Metabolic Panel (CMET) - HgB A1c - Lipase   No follow-ups on file.      Reynolds Bowl, PA-C, have reviewed all documentation for this visit. The documentation on 05/31/20 for the exam, diagnosis, procedures, and orders are all accurate and complete.   Rubye Beach  [facility] [phone] (phone) [phone] (fax)  [place]

## 2020-05-23 LAB — COMPREHENSIVE METABOLIC PANEL
ALT: 191 IU/L — ABNORMAL HIGH (ref 0–32)
AST: 41 IU/L — ABNORMAL HIGH (ref 0–40)
Albumin/Globulin Ratio: 1.6 (ref 1.2–2.2)
Albumin: 4.2 g/dL (ref 3.8–4.9)
Alkaline Phosphatase: 187 IU/L — ABNORMAL HIGH (ref 44–121)
BUN/Creatinine Ratio: 18 (ref 9–23)
BUN: 14 mg/dL (ref 6–24)
Bilirubin Total: 0.6 mg/dL (ref 0.0–1.2)
CO2: 24 mmol/L (ref 20–29)
Calcium: 9.2 mg/dL (ref 8.7–10.2)
Chloride: 100 mmol/L (ref 96–106)
Creatinine, Ser: 0.76 mg/dL (ref 0.57–1.00)
Globulin, Total: 2.7 g/dL (ref 1.5–4.5)
Glucose: 103 mg/dL — ABNORMAL HIGH (ref 65–99)
Potassium: 4.7 mmol/L (ref 3.5–5.2)
Sodium: 139 mmol/L (ref 134–144)
Total Protein: 6.9 g/dL (ref 6.0–8.5)
eGFR: 90 mL/min/{1.73_m2} (ref >59)

## 2020-05-23 LAB — CBC WITH DIFFERENTIAL/PLATELET
Basophils Absolute: 0.1 10*3/uL (ref 0.0–0.2)
Basos: 0 %
EOS (ABSOLUTE): 0.2 10*3/uL (ref 0.0–0.4)
Eos: 2 %
Hematocrit: 45.3 % (ref 34.0–46.6)
Hemoglobin: 15.3 g/dL (ref 11.1–15.9)
Immature Grans (Abs): 0.1 10*3/uL (ref 0.0–0.1)
Immature Granulocytes: 1 %
Lymphocytes Absolute: 1.4 10*3/uL (ref 0.7–3.1)
Lymphs: 12 %
MCH: 31.7 pg (ref 26.6–33.0)
MCHC: 33.8 g/dL (ref 31.5–35.7)
MCV: 94 fL (ref 79–97)
Monocytes Absolute: 1.1 10*3/uL — ABNORMAL HIGH (ref 0.1–0.9)
Monocytes: 10 %
Neutrophils Absolute: 8.7 10*3/uL — ABNORMAL HIGH (ref 1.4–7.0)
Neutrophils: 75 %
Platelets: 256 10*3/uL (ref 150–450)
RBC: 4.82 x10E6/uL (ref 3.77–5.28)
RDW: 11.8 % (ref 11.7–15.4)
WBC: 11.5 10*3/uL — ABNORMAL HIGH (ref 3.4–10.8)

## 2020-05-23 LAB — HEMOGLOBIN A1C
Est. average glucose Bld gHb Est-mCnc: 111 mg/dL
Hgb A1c MFr Bld: 5.5 % (ref 4.8–5.6)

## 2020-05-23 LAB — LIPASE: Lipase: 165 U/L — ABNORMAL HIGH (ref 14–72)

## 2020-05-25 ENCOUNTER — Encounter: Payer: Self-pay | Admitting: Physician Assistant

## 2020-05-25 ENCOUNTER — Other Ambulatory Visit: Payer: Self-pay | Admitting: Physician Assistant

## 2020-05-25 DIAGNOSIS — K851 Biliary acute pancreatitis without necrosis or infection: Secondary | ICD-10-CM

## 2020-05-26 ENCOUNTER — Other Ambulatory Visit: Payer: Self-pay | Admitting: *Deleted

## 2020-05-26 ENCOUNTER — Encounter: Payer: Self-pay | Admitting: *Deleted

## 2020-05-26 NOTE — Patient Outreach (Signed)
[facility]) Care Management  05/26/2020  Ashley Contreras [birth year] [id number]   Transition of care call/case closure   Referral received:05/18/20 Initial outreach:3/31 Insurance: [employer]    Subjective: 2nd call attempt  successful telephone call to patient's preferred number in order to complete transition of care assessment; 2 HIPAA identifiers verified. Explained purpose of call and completed transition of care assessment.  Ashley Contreras states that she is still having some discomfort from pancreatitis but some improvement since discharge. She reports  post-surgical pain  managed with prescribed medications. She reports making changes in diet after receiving information on pancreatitis diet eating small bland meals, improved in nausea no vomiting.  She discussed having diarrhea initially after discharge an that has now improved. denies bowel or bladder problems.  Spouse/children mother in law  are assisting with her  recovery.   Reviewed accessing the following [place] Benefits : She denies any ongoing health issues and says she does not need a referral to one of the [facility] chronic disease management programs.  She is unsure if she has the hospital indemnity plan, provided contact number to [place]. She is accessing FMLA benefit.  She uses a Ambulance person, [facility] employee health pharmacy .    Objective:  Ashley Contreras was hospitalized [facility] 3/26-3/29/22 for Acute pancreatitis, gall stone pancreatitis, s/p ERCP extraction of stone and stent placement, and 3/28 Laparoscopic assisted cholecstectomy  Comorbidities include: Gallstones, fatty liver disease  Cervical radiculopathy, GERD,  She was discharged to home on 05/19/20 without the need for home health servicesor DME.  Assessment:  Patient voices good understanding of all discharge instructions.  See transition of care flowsheet for assessment details.   Plan:   Reviewed hospital discharge diagnosis of Acute gallstone pancreatitis , Robotic laparoscopic cholecystectomy, ERCP stone extraction with stent placement.  and discharge treatment plan using hospital discharge instructions, assessing medication adherence, reviewing problems requiring provider notification, and discussing the importance of follow up with surgeon, primary care provider and/or specialists as directed. Reinforced notifying MD , GI specialist of new or worsening concerns.   Reviewed [facility] healthy lifestyle program information to receive discounted premium for  2023   Step 1: Get  your annual physical  Step 2: Complete your health assessment  Step 3:Identify your current health status and complete the corresponding action step between February 22, 2020 and October 22, 2020.    No ongoing care management needs identified so will close case to [facility] Management services. Routed [facility] outreach letter on initial call attempt.     Joylene Draft, RN, BSN  [facility] Management Coordinator  [phone]- Mobile [phone]- Toll Free Main Office

## 2020-05-28 ENCOUNTER — Telehealth: Payer: Self-pay | Admitting: *Deleted

## 2020-05-28 NOTE — Telephone Encounter (Signed)
Faxed FMLA to [employer] [phone]

## 2020-05-31 ENCOUNTER — Encounter: Payer: Self-pay | Admitting: Physician Assistant

## 2020-06-04 ENCOUNTER — Ambulatory Visit (INDEPENDENT_AMBULATORY_CARE_PROVIDER_SITE_OTHER): Payer: 59 | Admitting: Physician Assistant

## 2020-06-04 ENCOUNTER — Other Ambulatory Visit: Payer: Self-pay

## 2020-06-04 ENCOUNTER — Encounter: Payer: Self-pay | Admitting: Physician Assistant

## 2020-06-04 VITALS — BP 100/67 | HR 76 | Temp 98.1°F | Ht 66.0 in | Wt 134.4 lb

## 2020-06-04 DIAGNOSIS — K851 Biliary acute pancreatitis without necrosis or infection: Secondary | ICD-10-CM

## 2020-06-04 DIAGNOSIS — Z09 Encounter for follow-up examination after completed treatment for conditions other than malignant neoplasm: Secondary | ICD-10-CM

## 2020-06-04 MED FILL — Lifitegrast Ophth Soln 5%: OPHTHALMIC | 30 days supply | Qty: 60 | Fill #0 | Status: AC

## 2020-06-04 MED FILL — Estradiol TD Patch Twice Weekly 0.0375 MG/24HR: TRANSDERMAL | 84 days supply | Qty: 24 | Fill #0 | Status: AC

## 2020-06-04 NOTE — Patient Instructions (Signed)

## 2020-06-04 NOTE — Progress Notes (Signed)
[facility] POST-OP OFFICE VISIT  06/04/2020  HPI: Ashley Contreras is a 60 y.o. female 17 days s/p robotic assisted laparoscopic cholecystectomy for acute cholecystitis with gallstone pancreatitis with Dr Dahlia Byes.   She has had some abdominal soreness, particularly in the belly button but this is manageable with ibuprofen and tylenol No fever, chills, nausea, or emesis.  She has been avoiding/limiting fats in her diet Some intermittent diarrhea but this is sporadic No issues with the incisions No other complaints She is set to follow up with Dr Allen Norris in 1 week  Vital signs: BP 100/67   Pulse 76   Temp 98.1 F (36.7 C) (Oral)   Ht 5\' 6"  (1.676 m)   Wt 134 lb 6.4 oz (61 kg)   SpO2 97%   BMI 21.69 kg/m    Physical Exam: Constitutional: Well appearing female, NAD Abdomen: Soft, incisional soreness at umbilicus but otherwise non-tender, non-distended, no rebound/guarding Skin: Laparoscopic incisions have healed well, I did remove dermabond today, no erythema or drainage   Assessment/Plan: This is a 60 y.o. female 17 days s/p robotic assisted laparoscopic cholecystectomy for acute cholecystitis with gallstone pancreatitis    - Pain control prn with OTC medications  - Reviewed wound care  - Reviewed lifting restrictions  - Reviewed surgical pathology: Acute on chronic cholecystitis, negative for malignancy  - She can follow up with surgery clinic on as needed basis   - follow up with GI as scheduled  -- Edison Simon, PA-C [facility] 06/04/2020, 11:14 AM [phone] M-F: 7am - 4pm

## 2020-06-05 ENCOUNTER — Other Ambulatory Visit: Payer: Self-pay

## 2020-06-05 LAB — CBC WITH DIFFERENTIAL/PLATELET
Basophils Absolute: 0.1 10*3/uL (ref 0.0–0.2)
Basos: 1 %
EOS (ABSOLUTE): 0.1 10*3/uL (ref 0.0–0.4)
Eos: 3 %
Hematocrit: 41.4 % (ref 34.0–46.6)
Hemoglobin: 14.2 g/dL (ref 11.1–15.9)
Immature Grans (Abs): 0 10*3/uL (ref 0.0–0.1)
Immature Granulocytes: 0 %
Lymphocytes Absolute: 1.1 10*3/uL (ref 0.7–3.1)
Lymphs: 25 %
MCH: 32.5 pg (ref 26.6–33.0)
MCHC: 34.3 g/dL (ref 31.5–35.7)
MCV: 95 fL (ref 79–97)
Monocytes Absolute: 0.5 10*3/uL (ref 0.1–0.9)
Monocytes: 11 %
Neutrophils Absolute: 2.6 10*3/uL (ref 1.4–7.0)
Neutrophils: 60 %
Platelets: 316 10*3/uL (ref 150–450)
RBC: 4.37 x10E6/uL (ref 3.77–5.28)
RDW: 12.2 % (ref 11.7–15.4)
WBC: 4.5 10*3/uL (ref 3.4–10.8)

## 2020-06-05 LAB — LIPASE: Lipase: 112 U/L — ABNORMAL HIGH (ref 14–72)

## 2020-06-05 LAB — HEPATIC FUNCTION PANEL
ALT: 35 IU/L — ABNORMAL HIGH (ref 0–32)
AST: 21 IU/L (ref 0–40)
Albumin: 4.3 g/dL (ref 3.8–4.9)
Alkaline Phosphatase: 224 IU/L — ABNORMAL HIGH (ref 44–121)
Bilirubin Total: 0.4 mg/dL (ref 0.0–1.2)
Bilirubin, Direct: 0.16 mg/dL (ref 0.00–0.40)
Total Protein: 6.5 g/dL (ref 6.0–8.5)

## 2020-06-10 ENCOUNTER — Ambulatory Visit (INDEPENDENT_AMBULATORY_CARE_PROVIDER_SITE_OTHER): Payer: 59 | Admitting: Dermatology

## 2020-06-10 ENCOUNTER — Other Ambulatory Visit: Payer: Self-pay

## 2020-06-10 DIAGNOSIS — L570 Actinic keratosis: Secondary | ICD-10-CM | POA: Diagnosis not present

## 2020-06-10 DIAGNOSIS — B351 Tinea unguium: Secondary | ICD-10-CM

## 2020-06-10 DIAGNOSIS — L814 Other melanin hyperpigmentation: Secondary | ICD-10-CM

## 2020-06-10 MED ORDER — TAVABOROLE 5 % EX SOLN: CUTANEOUS | 2 refills | Status: DC

## 2020-06-10 NOTE — Patient Instructions (Addendum)

## 2020-06-10 NOTE — Progress Notes (Signed)
   Follow-Up Visit   Subjective  Ashley Contreras is a 60 y.o. female who presents for the following: Spot (Rough spot on right nose.) and Nail Problem (Left great toenail, unsure if trauma related. Hx of soreness.).   The following portions of the chart were reviewed this encounter and updated as appropriate:       Review of Systems:  No other skin or systemic complaints except as noted in HPI or Assessment and Plan.  Objective  Well appearing patient in no apparent distress; mood and affect are within normal limits.  A focused examination was performed including face, left foot. Relevant physical exam findings are noted in the Assessment and Plan.  Objective  R Dorsum of Nose: Pink scaly macule.  Objective  Left lateral cheek: 6.43mm light tan macule.  Images    Objective  Left great toenail: Yellow white discoloration with onycholysis  Images     Assessment & Plan  AK (actinic keratosis) R Dorsum of Nose  Prior to procedure, discussed risks of blister formation, small wound, skin dyspigmentation, or rare scar following cryotherapy.    Destruction of lesion - R Dorsum of Nose  Destruction method: cryotherapy   Informed consent: discussed and consent obtained   Lesion destroyed using liquid nitrogen: Yes   Region frozen until ice ball extended beyond lesion: Yes   Outcome: patient tolerated procedure well with no complications   Post-procedure details: wound care instructions given    Lentigines Left lateral cheek  Discussed cosmetic procedure (cryotherapy), noncovered.  $60 for 1st lesion and $15 for each additional lesion if done on the same day.  Maximum charge $350.  One touch-up treatment included no charge. Discussed risks of treatment including dyspigmentation, small scar, and/or recurrence. Recommend daily broad spectrum sunscreen SPF 30+/photoprotection to treated areas once healed.    Destruction of lesion - Left lateral cheek  Destruction  method: cryotherapy   Informed consent: discussed and consent obtained   Lesion destroyed using liquid nitrogen: Yes   Region frozen until ice ball extended beyond lesion: Yes   Outcome: patient tolerated procedure well with no complications   Post-procedure details: wound care instructions given    Tinea unguium Left great toenail  Symptomatic Start Kerydin solution Apply to affected toenail qhs until improved dsp 17mL 2Rf.  Sent to [employer] [facility]    [facility]) 5 % SOLN - Left great toenail  Return in about 2 months (around 08/10/2020) for f/u AK, toenail and lentigo.  IJamesetta Contreras, CMA, am acting as scribe for Ashley Patty, MD .  Documentation: I have reviewed the above documentation for accuracy and completeness, and I agree with the above.  Ashley Patty MD

## 2020-06-11 ENCOUNTER — Ambulatory Visit: Payer: 59 | Admitting: Gastroenterology

## 2020-06-11 ENCOUNTER — Other Ambulatory Visit: Payer: Self-pay

## 2020-06-11 ENCOUNTER — Ambulatory Visit (INDEPENDENT_AMBULATORY_CARE_PROVIDER_SITE_OTHER): Payer: 59 | Admitting: Gastroenterology

## 2020-06-11 ENCOUNTER — Telehealth: Payer: Self-pay | Admitting: *Deleted

## 2020-06-11 ENCOUNTER — Encounter: Payer: Self-pay | Admitting: Gastroenterology

## 2020-06-11 VITALS — BP 103/61 | HR 72 | Ht 66.0 in | Wt 135.0 lb

## 2020-06-11 DIAGNOSIS — K8020 Calculus of gallbladder without cholecystitis without obstruction: Secondary | ICD-10-CM | POA: Diagnosis not present

## 2020-06-11 NOTE — Progress Notes (Signed)
Primary Care Physician: Maple Hudson., MD  Primary Gastroenterologist:  Dr. Midge Minium  Chief Complaint  Patient presents with  . Hospitalization Follow-up    HPI: Ashley Contreras is a 60 y.o. female here for follow-up after being discharged from the hospital.  The patient was found to have gallstone pancreatitis and possible pancreatic divisum.  The patient had an ERCP with a stent placed because she had been given Lovenox just prior to the ERCP. The patient has multiple questions including why her alkaline phosphatase is still elevated and whether she has a risk of having pancreatitis again after the ERCP.  There is no report of any unexplained weight loss fevers chills nausea vomiting but she does have some pain with some types of food including water vegetables.  Past Medical History:  Diagnosis Date  . Abnormal Pap smear 1989   s/p colposcopy  . Allergy    hay fever  . Arthritis   . Asthma   . BRCA negative 10/2018   MyRisk neg except ATM VUS  . Chicken pox   . Colon polyp   . Complication of anesthesia   . Family history of adverse reaction to anesthesia    sister - slow to wake  . Family history of breast cancer   . Fatty liver    seen by hepatology  . GERD (gastroesophageal reflux disease)   . Increased risk of breast cancer 10/2018   IBIS=24.7%/riskscore=15.8%  . Mitral valve prolapse   . PONV (postoperative nausea and vomiting)   . Shingles    waist, right side  . UTI (lower urinary tract infection)   . Wears contact lenses     Current Outpatient Medications  Medication Sig Dispense Refill  . acetaminophen (TYLENOL) 500 MG tablet Take 500 mg by mouth every 4 (four) hours as needed.    . calcium-vitamin D (OSCAL WITH D) 500-200 MG-UNIT TABS tablet Take by mouth.    . Cholecalciferol 50 MCG (2000 UT) TABS Take by mouth.    . estradiol (VIVELLE-DOT) 0.0375 MG/24HR PLACE 1 PATCH ONTO THE SKIN 2 TIMES A WEEK. 24 patch 3  . fluticasone (FLONASE) 50  MCG/ACT nasal spray INSTILL 2 SPRAYS INTO BOTH NOSTRILS DAILY 16 g 2  . ibuprofen (ADVIL) 800 MG tablet TAKE 1 TABLET (800 MG TOTAL) BY MOUTH EVERY 8 (EIGHT) HOURS AS NEEDED. 30 tablet 0  . Lifitegrast 5 % SOLN PLACE 1 DROP INTO BOTH EYES TWICE A DAY 60 each 6  . loratadine (CLARITIN) 10 MG tablet Take 10 mg by mouth daily.    . Multiple Vitamins-Minerals (PRESERVISION AREDS 2 PO) Take by mouth 2 (two) times daily.    . pantoprazole (PROTONIX) 40 MG tablet TAKE 1 TABLET BY MOUTH 2 TIMES DAILY. (Patient taking differently: Take 40 mg by mouth 2 (two) times daily.) 180 tablet 1  . polyethylene glycol (MIRALAX / GLYCOLAX) 17 g packet Take 17 g by mouth daily as needed for moderate constipation. 14 each 0  . Tavaborole (KERYDIN) 5 % SOLN Apply to affected toenail nightly until improved. 10 mL 2  . triamcinolone (KENALOG) 0.1 % APPLY 1 TO 2 TIMES DAILY TO AFFECTED AREAS AS NEEDED FOR ITCH. AVOID APPLYING TO FACE, GROIN, AND AXILLA. 80 g 1  . valACYclovir (VALTREX) 1000 MG tablet TAKE 2 TABLETS AT THE ONSET OF A FEVER BLISTER AND THEN TAKE 2 TABLETS 12 HOURS LATER. USE AS NEEDED (Patient taking differently: Take 2,000 mg by mouth 2 (two) times daily.  Take 2 tablets at the onset of a fever blister and then take 2 tablets 12 hours later. As needed) 30 tablet 1   No current facility-administered medications for this visit.    Allergies as of 06/11/2020 - Review Complete 06/11/2020  Allergen Reaction Noted  . Penicillins Swelling 10/21/2011  . Cinnamon Swelling 01/20/2015    ROS:  General: Negative for anorexia, weight loss, fever, chills, fatigue, weakness. ENT: Negative for hoarseness, difficulty swallowing , nasal congestion. CV: Negative for chest pain, angina, palpitations, dyspnea on exertion, peripheral edema.  Respiratory: Negative for dyspnea at rest, dyspnea on exertion, cough, sputum, wheezing.  GI: See history of present illness. GU:  Negative for dysuria, hematuria, urinary  incontinence, urinary frequency, nocturnal urination.  Endo: Negative for unusual weight change.    Physical Examination:   BP 103/61   Pulse 72   Ht _0  (1.676 m)   Wt 135 lb (61.2 kg)   BMI 21.79 kg/m   General: Well-nourished, well-developed in no acute distress.  Eyes: No icterus. Conjunctivae pink. Extremities: No lower extremity edema. No clubbing or deformities. Neuro: Alert and oriented x 3.  Grossly intact. Skin: Warm and dry, no jaundice.   Psych: Alert and cooperative, normal mood and affect.  Labs:    Imaging Studies: DG Chest 2 View  Result Date: 05/16/2020 CLINICAL DATA:  Chest pain EXAM: CHEST - 2 VIEW COMPARISON:  09/18/2012 FINDINGS: The heart size and mediastinal contours are within normal limits. Both lungs are clear. The visualized skeletal structures are unremarkable. IMPRESSION: No active cardiopulmonary disease. Electronically Signed   By: Davina Poke D.O.   On: 05/16/2020 15:33   MR 3D Recon At Scanner  Result Date: 05/16/2020 CLINICAL DATA:  Concern for choledocholithiasis. EXAM: MRI ABDOMEN WITHOUT AND WITH CONTRAST (INCLUDING MRCP) TECHNIQUE: Multiplanar multisequence MR imaging of the abdomen was performed both before and after the administration of intravenous contrast. Heavily T2-weighted images of the biliary and pancreatic ducts were obtained, and three-dimensional MRCP images were rendered by post processing. CONTRAST:  7.23m GADAVIST GADOBUTROL 1 MMOL/ML IV SOLN COMPARISON:  Same-day right upper quadrant ultrasound. CT abdomen pelvis February 12, 2016. FINDINGS: Lower chest: No acute finding. Hepatobiliary: No hepatic steatosis. Tiny bilobar hepatic cysts the largest of which is in the inferior aspect of the right lobe of the liver measuring 7 mm on image 17/3. No suspicious hepatic hepatic lesions. Gallbladder is dilated with pericholecystic fluid. No cholelithiasis. There is prominence of the intra and extrahepatic biliary tree with the common  duct measuring up to 7 mm on image 17/3. With abrupt narrowing at the ampulla. Peribiliary enhancement most notably involving the pancreatic portions of the common bile duct and at the ampulla on image 40/21. No discrete intraluminal filling defect visualized within the bile ducts. Pancreas: Edematous appearance of the uncinate process/head of the pancreas with adjacent peripancreatic stranding, no walled off collection. No pancreatic hypoenhancement. Probable pancreatic divisum. No pancreatic ductal dilation. Spleen:  Within normal limits in size and appearance. Adrenals/Urinary Tract: Bilateral adrenal glands are unremarkable. Bilateral renal cysts. No suspicious enhancing renal lesions. Stomach/Bowel: Stomach is grossly unremarkable. There is mild hyperenhancement and wall thickening of the second part of duodenum. No evidence of bowel obstruction. Vascular/Lymphatic: No pathologically enlarged lymph nodes identified. No abdominal aortic aneurysm demonstrated. The portal splenic and hepatic veins appear patent. Other:  No walled off fluid collections. Musculoskeletal: No suspicious bone lesions identified. IMPRESSION: 1. Acute interstitial pancreatitis involving the uncinate process and head of the pancreas, no  walled off collection or evidence of necrosis. 2. Mild prominence of the intra and extrahepatic biliary tree with abrupt narrowing at the ampulla with peribiliary enhancement most notably involving the pancreatic portions of the common bile duct and at the ampulla. No choledocholithiasis or discrete enhancing intraluminal filling defect visualized. Findings which may represent sequela of recently passed stone however given the narrowing and enhancement ampullary pathology is also a consideration, this could be further evaluated with ERCP. 3. Dilated gallbladder with pericholecystic fluid findings which are likely reactive. However, acute cholecystitis not completely excluded. 4. 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 Electronically Signed   By: Dahlia Bailiff MD   On: 05/16/2020 21:55   DG C-Arm 1-60 Min-No Report  Result Date: 05/17/2020 Fluoroscopy was utilized by the requesting physician.  No radiographic interpretation.   MR ABDOMEN MRCP W WO CONTAST  Result Date: 05/16/2020 CLINICAL DATA:  Concern for choledocholithiasis. EXAM: MRI ABDOMEN WITHOUT AND WITH CONTRAST (INCLUDING MRCP) TECHNIQUE: Multiplanar multisequence MR imaging of the abdomen was performed both before and after the administration of intravenous contrast. Heavily T2-weighted images of the biliary and pancreatic ducts were obtained, and three-dimensional MRCP images were rendered by post processing. CONTRAST:  7.49mL GADAVIST GADOBUTROL 1 MMOL/ML IV SOLN COMPARISON:  Same-day right upper quadrant ultrasound. CT abdomen pelvis February 12, 2016. FINDINGS: Lower chest: No acute finding. Hepatobiliary: No hepatic steatosis. Tiny bilobar hepatic cysts the largest of which is in the inferior aspect of the right lobe of the liver measuring 7 mm on image 17/3. No suspicious hepatic hepatic lesions. Gallbladder is dilated with pericholecystic fluid. No cholelithiasis. There is prominence of the intra and extrahepatic biliary tree with the common duct measuring up to 7 mm on image 17/3. With abrupt narrowing at the ampulla. Peribiliary enhancement most notably involving the pancreatic portions of the common bile duct and at the ampulla on image 40/21. No discrete intraluminal filling defect visualized within the bile ducts. Pancreas: Edematous appearance of the uncinate process/head of the pancreas with adjacent peripancreatic stranding, no walled off collection. No pancreatic hypoenhancement. Probable pancreatic divisum. No pancreatic ductal dilation. Spleen:  Within normal limits in size and appearance. Adrenals/Urinary Tract: Bilateral adrenal glands are unremarkable. Bilateral renal cysts. No suspicious enhancing renal lesions. Stomach/Bowel:  Stomach is grossly unremarkable. There is mild hyperenhancement and wall thickening of the second part of duodenum. No evidence of bowel obstruction. Vascular/Lymphatic: No pathologically enlarged lymph nodes identified. No abdominal aortic aneurysm demonstrated. The portal splenic and hepatic veins appear patent. Other:  No walled off fluid collections. Musculoskeletal: No suspicious bone lesions identified. IMPRESSION: 1. Acute interstitial pancreatitis involving the uncinate process and head of the pancreas, no walled off collection or evidence of necrosis. 2. Mild prominence of the intra and extrahepatic biliary tree with abrupt narrowing at the ampulla with peribiliary enhancement most notably involving the pancreatic portions of the common bile duct and at the ampulla. No choledocholithiasis or discrete enhancing intraluminal filling defect visualized. Findings which may represent sequela of recently passed stone however given the narrowing and enhancement ampullary pathology is also a consideration, this could be further evaluated with ERCP. 3. Dilated gallbladder with pericholecystic fluid findings which are likely reactive. However, acute cholecystitis not completely excluded. 4. Probable pancreatic divisum. Electronically Signed   By: Dahlia Bailiff MD   On: 05/16/2020 21:55   US ABDOMEN LIMITED RUQ (LIVER/GB)  Result Date: 05/16/2020 CLINICAL DATA:  60 year old female with RIGHT UPPER quadrant abdominal pain for 1 day. EXAM: ULTRASOUND ABDOMEN LIMITED RIGHT UPPER QUADRANT  COMPARISON:  02/12/2016 CT and 02/25/2015 ultrasound FINDINGS: Gallbladder: The gallbladder is unremarkable. There is no evidence of cholelithiasis, gallbladder wall thickening or sonographic Murphy sign. A trace amount of pericholecystic fluid is noted. Common bile duct: Diameter: 9 mm-distended. The LOWER CBD is not visualized. No intrahepatic biliary dilatation identified. Liver: No focal lesion identified. Within normal limits in  parenchymal echogenicity. Portal vein is patent on color Doppler imaging with normal direction of blood flow towards the liver. Other: None. IMPRESSION: 1. CBD dilatation - CBD obstruction is not excluded. An obstructing cause is not identified, but the LOWER CBD is not visualized. 2. No evidence of cholelithiasis or gallbladder wall thickening. 3. Unremarkable liver. Electronically Signed   By: Margarette Canada M.D.   On: 05/16/2020 16:20    Assessment and Plan:   Ashley Contreras is a 60 y.o. y/o female who comes in today with a history of gallstone pancreatitis.  The patient had a stent placed in the common bile duct with immediate relief and then had her gallbladder out.  The patient had been on Lovenox so a sphincterotomy was not done.  The patient will be set up for an ERCP with sphincterotomy and stone extraction. The patient has been told that she is at risk of pancreatic medicine she may need a sphincterotomy.  She has also been told that her increased endoprosthesis likely from the irritation of the bile duct by the stent.  The patient has asked all of her questions and has had them answered.  The patient will follow time of the ERCP.     Lucilla Lame, MD. Marval Regal    Note: This dictation was prepared with Dragon dictation along with smaller phrase technology. Any transcriptional errors that result from this process are unintentional.

## 2020-06-11 NOTE — H&P (View-Only) (Signed)
Primary Care Physician: Ashley Banana., MD  Primary Gastroenterologist:  Dr. Lucilla Contreras  Chief Complaint  Patient presents with  . Hospitalization Follow-up    HPI: Ashley Contreras is a 60 y.o. female here for follow-up after being discharged from the hospital.  The patient was found to have gallstone pancreatitis and possible pancreatic divisum.  The patient had an ERCP with a stent placed because she had been given Lovenox just prior to the ERCP. The patient has multiple questions including why her alkaline phosphatase is still elevated and whether she has a risk of having pancreatitis again after the ERCP.  There is no report of any unexplained weight loss fevers chills nausea vomiting but she does have some pain with some types of food including water vegetables.  Past Medical History:  Diagnosis Date  . Abnormal Pap smear 1989   s/p colposcopy  . Allergy    hay fever  . Arthritis   . Asthma   . BRCA negative 10/2018   MyRisk neg except ATM VUS  . Chicken pox   . Colon polyp   . Complication of anesthesia   . Family history of adverse reaction to anesthesia    sister - slow to wake  . Family history of breast cancer   . Fatty liver    seen by hepatology  . GERD (gastroesophageal reflux disease)   . Increased risk of breast cancer 10/2018   IBIS=24.7%/riskscore=15.8%  . Mitral valve prolapse   . PONV (postoperative nausea and vomiting)   . Shingles    waist, right side  . UTI (lower urinary tract infection)   . Wears contact lenses     Current Outpatient Medications  Medication Sig Dispense Refill  . acetaminophen (TYLENOL) 500 MG tablet Take 500 mg by mouth every 4 (four) hours as needed.    . calcium-vitamin D (OSCAL WITH D) 500-200 MG-UNIT TABS tablet Take by mouth.    . Cholecalciferol 50 MCG (2000 UT) TABS Take by mouth.    . estradiol (VIVELLE-DOT) 0.0375 MG/24HR PLACE 1 PATCH ONTO THE SKIN 2 TIMES A WEEK. 24 patch 3  . fluticasone (FLONASE) 50  MCG/ACT nasal spray INSTILL 2 SPRAYS INTO BOTH NOSTRILS DAILY 16 g 2  . ibuprofen (ADVIL) 800 MG tablet TAKE 1 TABLET (800 MG TOTAL) BY MOUTH EVERY 8 (EIGHT) HOURS AS NEEDED. 30 tablet 0  . Lifitegrast 5 % SOLN PLACE 1 DROP INTO BOTH EYES TWICE A DAY 60 each 6  . loratadine (CLARITIN) 10 MG tablet Take 10 mg by mouth daily.    . Multiple Vitamins-Minerals (PRESERVISION AREDS 2 PO) Take by mouth 2 (two) times daily.    . pantoprazole (PROTONIX) 40 MG tablet TAKE 1 TABLET BY MOUTH 2 TIMES DAILY. (Patient taking differently: Take 40 mg by mouth 2 (two) times daily.) 180 tablet 1  . polyethylene glycol (MIRALAX / GLYCOLAX) 17 g packet Take 17 g by mouth daily as needed for moderate constipation. 14 each 0  . Tavaborole (KERYDIN) 5 % SOLN Apply to affected toenail nightly until improved. 10 mL 2  . triamcinolone (KENALOG) 0.1 % APPLY 1 TO 2 TIMES DAILY TO AFFECTED AREAS AS NEEDED FOR ITCH. AVOID APPLYING TO FACE, GROIN, AND AXILLA. 80 g 1  . valACYclovir (VALTREX) 1000 MG tablet TAKE 2 TABLETS AT THE ONSET OF A FEVER BLISTER AND THEN TAKE 2 TABLETS 12 HOURS LATER. USE AS NEEDED (Patient taking differently: Take 2,000 mg by mouth 2 (two) times daily.  Take 2 tablets at the onset of a fever blister and then take 2 tablets 12 hours later. As needed) 30 tablet 1   No current facility-administered medications for this visit.    Allergies as of 06/11/2020 - Review Complete 06/11/2020  Allergen Reaction Noted  . Penicillins Swelling 10/21/2011  . Cinnamon Swelling 01/20/2015    ROS:  General: Negative for anorexia, weight loss, fever, chills, fatigue, weakness. ENT: Negative for hoarseness, difficulty swallowing , nasal congestion. CV: Negative for chest pain, angina, palpitations, dyspnea on exertion, peripheral edema.  Respiratory: Negative for dyspnea at rest, dyspnea on exertion, cough, sputum, wheezing.  GI: See history of present illness. GU:  Negative for dysuria, hematuria, urinary  incontinence, urinary frequency, nocturnal urination.  Endo: Negative for unusual weight change.    Physical Examination:   BP 103/61   Pulse 72   Ht _0  (1.676 m)   Wt 135 lb (61.2 kg)   BMI 21.79 kg/m   General: Well-nourished, well-developed in no acute distress.  Eyes: No icterus. Conjunctivae pink. Extremities: No lower extremity edema. No clubbing or deformities. Neuro: Alert and oriented x 3.  Grossly intact. Skin: Warm and dry, no jaundice.   Psych: Alert and cooperative, normal mood and affect.  Labs:    Imaging Studies: DG Chest 2 View  Result Date: 05/16/2020 CLINICAL DATA:  Chest pain EXAM: CHEST - 2 VIEW COMPARISON:  09/18/2012 FINDINGS: The heart size and mediastinal contours are within normal limits. Both lungs are clear. The visualized skeletal structures are unremarkable. IMPRESSION: No active cardiopulmonary disease. Electronically Signed   By: Davina Poke D.O.   On: 05/16/2020 15:33   MR 3D Recon At Scanner  Result Date: 05/16/2020 CLINICAL DATA:  Concern for choledocholithiasis. EXAM: MRI ABDOMEN WITHOUT AND WITH CONTRAST (INCLUDING MRCP) TECHNIQUE: Multiplanar multisequence MR imaging of the abdomen was performed both before and after the administration of intravenous contrast. Heavily T2-weighted images of the biliary and pancreatic ducts were obtained, and three-dimensional MRCP images were rendered by post processing. CONTRAST:  7.23m GADAVIST GADOBUTROL 1 MMOL/ML IV SOLN COMPARISON:  Same-day right upper quadrant ultrasound. CT abdomen pelvis February 12, 2016. FINDINGS: Lower chest: No acute finding. Hepatobiliary: No hepatic steatosis. Tiny bilobar hepatic cysts the largest of which is in the inferior aspect of the right lobe of the liver measuring 7 mm on image 17/3. No suspicious hepatic hepatic lesions. Gallbladder is dilated with pericholecystic fluid. No cholelithiasis. There is prominence of the intra and extrahepatic biliary tree with the common  duct measuring up to 7 mm on image 17/3. With abrupt narrowing at the ampulla. Peribiliary enhancement most notably involving the pancreatic portions of the common bile duct and at the ampulla on image 40/21. No discrete intraluminal filling defect visualized within the bile ducts. Pancreas: Edematous appearance of the uncinate process/head of the pancreas with adjacent peripancreatic stranding, no walled off collection. No pancreatic hypoenhancement. Probable pancreatic divisum. No pancreatic ductal dilation. Spleen:  Within normal limits in size and appearance. Adrenals/Urinary Tract: Bilateral adrenal glands are unremarkable. Bilateral renal cysts. No suspicious enhancing renal lesions. Stomach/Bowel: Stomach is grossly unremarkable. There is mild hyperenhancement and wall thickening of the second part of duodenum. No evidence of bowel obstruction. Vascular/Lymphatic: No pathologically enlarged lymph nodes identified. No abdominal aortic aneurysm demonstrated. The portal splenic and hepatic veins appear patent. Other:  No walled off fluid collections. Musculoskeletal: No suspicious bone lesions identified. IMPRESSION: 1. Acute interstitial pancreatitis involving the uncinate process and head of the pancreas, no  walled off collection or evidence of necrosis. 2. Mild prominence of the intra and extrahepatic biliary tree with abrupt narrowing at the ampulla with peribiliary enhancement most notably involving the pancreatic portions of the common bile duct and at the ampulla. No choledocholithiasis or discrete enhancing intraluminal filling defect visualized. Findings which may represent sequela of recently passed stone however given the narrowing and enhancement ampullary pathology is also a consideration, this could be further evaluated with ERCP. 3. Dilated gallbladder with pericholecystic fluid findings which are likely reactive. However, acute cholecystitis not completely excluded. 4. 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 Electronically Signed   By: Dahlia Bailiff MD   On: 05/16/2020 21:55   DG C-Arm 1-60 Min-No Report  Result Date: 05/17/2020 Fluoroscopy was utilized by the requesting physician.  No radiographic interpretation.   MR ABDOMEN MRCP W WO CONTAST  Result Date: 05/16/2020 CLINICAL DATA:  Concern for choledocholithiasis. EXAM: MRI ABDOMEN WITHOUT AND WITH CONTRAST (INCLUDING MRCP) TECHNIQUE: Multiplanar multisequence MR imaging of the abdomen was performed both before and after the administration of intravenous contrast. Heavily T2-weighted images of the biliary and pancreatic ducts were obtained, and three-dimensional MRCP images were rendered by post processing. CONTRAST:  7.87mL GADAVIST GADOBUTROL 1 MMOL/ML IV SOLN COMPARISON:  Same-day right upper quadrant ultrasound. CT abdomen pelvis February 12, 2016. FINDINGS: Lower chest: No acute finding. Hepatobiliary: No hepatic steatosis. Tiny bilobar hepatic cysts the largest of which is in the inferior aspect of the right lobe of the liver measuring 7 mm on image 17/3. No suspicious hepatic hepatic lesions. Gallbladder is dilated with pericholecystic fluid. No cholelithiasis. There is prominence of the intra and extrahepatic biliary tree with the common duct measuring up to 7 mm on image 17/3. With abrupt narrowing at the ampulla. Peribiliary enhancement most notably involving the pancreatic portions of the common bile duct and at the ampulla on image 40/21. No discrete intraluminal filling defect visualized within the bile ducts. Pancreas: Edematous appearance of the uncinate process/head of the pancreas with adjacent peripancreatic stranding, no walled off collection. No pancreatic hypoenhancement. Probable pancreatic divisum. No pancreatic ductal dilation. Spleen:  Within normal limits in size and appearance. Adrenals/Urinary Tract: Bilateral adrenal glands are unremarkable. Bilateral renal cysts. No suspicious enhancing renal lesions. Stomach/Bowel:  Stomach is grossly unremarkable. There is mild hyperenhancement and wall thickening of the second part of duodenum. No evidence of bowel obstruction. Vascular/Lymphatic: No pathologically enlarged lymph nodes identified. No abdominal aortic aneurysm demonstrated. The portal splenic and hepatic veins appear patent. Other:  No walled off fluid collections. Musculoskeletal: No suspicious bone lesions identified. IMPRESSION: 1. Acute interstitial pancreatitis involving the uncinate process and head of the pancreas, no walled off collection or evidence of necrosis. 2. Mild prominence of the intra and extrahepatic biliary tree with abrupt narrowing at the ampulla with peribiliary enhancement most notably involving the pancreatic portions of the common bile duct and at the ampulla. No choledocholithiasis or discrete enhancing intraluminal filling defect visualized. Findings which may represent sequela of recently passed stone however given the narrowing and enhancement ampullary pathology is also a consideration, this could be further evaluated with ERCP. 3. Dilated gallbladder with pericholecystic fluid findings which are likely reactive. However, acute cholecystitis not completely excluded. 4. Probable pancreatic divisum. Electronically Signed   By: Dahlia Bailiff MD   On: 05/16/2020 21:55   US ABDOMEN LIMITED RUQ (LIVER/GB)  Result Date: 05/16/2020 CLINICAL DATA:  60 year old female with RIGHT UPPER quadrant abdominal pain for 1 day. EXAM: ULTRASOUND ABDOMEN LIMITED RIGHT UPPER QUADRANT  COMPARISON:  02/12/2016 CT and 02/25/2015 ultrasound FINDINGS: Gallbladder: The gallbladder is unremarkable. There is no evidence of cholelithiasis, gallbladder wall thickening or sonographic Murphy sign. A trace amount of pericholecystic fluid is noted. Common bile duct: Diameter: 9 mm-distended. The LOWER CBD is not visualized. No intrahepatic biliary dilatation identified. Liver: No focal lesion identified. Within normal limits in  parenchymal echogenicity. Portal vein is patent on color Doppler imaging with normal direction of blood flow towards the liver. Other: None. IMPRESSION: 1. CBD dilatation - CBD obstruction is not excluded. An obstructing cause is not identified, but the LOWER CBD is not visualized. 2. No evidence of cholelithiasis or gallbladder wall thickening. 3. Unremarkable liver. Electronically Signed   By: Margarette Canada M.D.   On: 05/16/2020 16:20    Assessment and Plan:   JELISHA WEED is a 60 y.o. y/o female who comes in today with a history of gallstone pancreatitis.  The patient had a stent placed in the common bile duct with immediate relief and then had her gallbladder out.  The patient had been on Lovenox so a sphincterotomy was not done.  The patient will be set up for an ERCP with sphincterotomy and stone extraction. The patient has been told that she is at risk of pancreatic medicine she may need a sphincterotomy.  She has also been told that her increased endoprosthesis likely from the irritation of the bile duct by the stent.  The patient has asked all of her questions and has had them answered.  The patient will follow time of the ERCP.     Ashley Lame, MD. Ashley Contreras    Note: This dictation was prepared with Dragon dictation along with smaller phrase technology. Any transcriptional errors that result from this process are unintentional.

## 2020-06-11 NOTE — Telephone Encounter (Signed)
Faxed FMLA to [employer] at [phone]

## 2020-06-12 DIAGNOSIS — E0590 Thyrotoxicosis, unspecified without thyrotoxic crisis or storm: Secondary | ICD-10-CM | POA: Diagnosis not present

## 2020-06-15 ENCOUNTER — Other Ambulatory Visit: Payer: Self-pay

## 2020-06-15 DIAGNOSIS — K8020 Calculus of gallbladder without cholecystitis without obstruction: Secondary | ICD-10-CM

## 2020-06-15 DIAGNOSIS — Z4582 Encounter for adjustment or removal of myringotomy device (stent) (tube): Secondary | ICD-10-CM

## 2020-06-17 ENCOUNTER — Encounter: Payer: Self-pay | Admitting: Gastroenterology

## 2020-06-18 ENCOUNTER — Ambulatory Visit: Payer: 59

## 2020-06-18 ENCOUNTER — Encounter
Admission: RE | Disposition: A | Discharge status: Home / Self Care | Payer: Self-pay | Source: Home / Self Care | Attending: Gastroenterology

## 2020-06-18 ENCOUNTER — Ambulatory Visit: Payer: 59 | Admitting: Anesthesiology

## 2020-06-18 ENCOUNTER — Encounter: Payer: Self-pay | Admitting: Gastroenterology

## 2020-06-18 ENCOUNTER — Ambulatory Visit
Admission: RE | Admit: 2020-06-18 | Discharge: 2020-06-18 | Disposition: A | Discharge status: Home / Self Care | Payer: 59 | Attending: Gastroenterology | Admitting: Gastroenterology

## 2020-06-18 DIAGNOSIS — K8050 Calculus of bile duct without cholangitis or cholecystitis without obstruction: Secondary | ICD-10-CM

## 2020-06-18 DIAGNOSIS — Z803 Family history of malignant neoplasm of breast: Secondary | ICD-10-CM | POA: Diagnosis not present

## 2020-06-18 DIAGNOSIS — R932 Abnormal findings on diagnostic imaging of liver and biliary tract: Secondary | ICD-10-CM | POA: Insufficient documentation

## 2020-06-18 DIAGNOSIS — Z8261 Family history of arthritis: Secondary | ICD-10-CM | POA: Diagnosis not present

## 2020-06-18 DIAGNOSIS — N281 Cyst of kidney, acquired: Secondary | ICD-10-CM | POA: Insufficient documentation

## 2020-06-18 DIAGNOSIS — Z823 Family history of stroke: Secondary | ICD-10-CM | POA: Diagnosis not present

## 2020-06-18 DIAGNOSIS — Z79899 Other long term (current) drug therapy: Secondary | ICD-10-CM | POA: Diagnosis not present

## 2020-06-18 DIAGNOSIS — Z91018 Allergy to other foods: Secondary | ICD-10-CM | POA: Diagnosis not present

## 2020-06-18 DIAGNOSIS — Z8349 Family history of other endocrine, nutritional and metabolic diseases: Secondary | ICD-10-CM | POA: Diagnosis not present

## 2020-06-18 DIAGNOSIS — Z880 Allergy status to penicillin: Secondary | ICD-10-CM | POA: Insufficient documentation

## 2020-06-18 DIAGNOSIS — Z7901 Long term (current) use of anticoagulants: Secondary | ICD-10-CM | POA: Diagnosis not present

## 2020-06-18 DIAGNOSIS — Z791 Long term (current) use of non-steroidal anti-inflammatories (NSAID): Secondary | ICD-10-CM | POA: Insufficient documentation

## 2020-06-18 DIAGNOSIS — Z820 Family history of epilepsy and other diseases of the nervous system: Secondary | ICD-10-CM | POA: Insufficient documentation

## 2020-06-18 DIAGNOSIS — Z833 Family history of diabetes mellitus: Secondary | ICD-10-CM | POA: Insufficient documentation

## 2020-06-18 DIAGNOSIS — Z4659 Encounter for fitting and adjustment of other gastrointestinal appliance and device: Secondary | ICD-10-CM | POA: Diagnosis not present

## 2020-06-18 DIAGNOSIS — Z8249 Family history of ischemic heart disease and other diseases of the circulatory system: Secondary | ICD-10-CM | POA: Diagnosis not present

## 2020-06-18 DIAGNOSIS — K8020 Calculus of gallbladder without cholecystitis without obstruction: Secondary | ICD-10-CM | POA: Diagnosis not present

## 2020-06-18 DIAGNOSIS — Z801 Family history of malignant neoplasm of trachea, bronchus and lung: Secondary | ICD-10-CM | POA: Insufficient documentation

## 2020-06-18 HISTORY — PX: ERCP: SHX5425

## 2020-06-18 SURGERY — ERCP, WITH INTERVENTION IF INDICATED
Anesthesia: General

## 2020-06-18 MED ORDER — LACTATED RINGERS IV SOLN: INTRAVENOUS | Status: DC

## 2020-06-18 MED ORDER — INDOMETHACIN 50 MG RE SUPP
RECTAL | Status: AC
  Filled 2020-06-18: qty 2

## 2020-06-18 MED ORDER — SODIUM CHLORIDE 0.9 % IV SOLN
INTRAVENOUS | Status: DC | PRN
  Administered 2020-06-18: 15 mL

## 2020-06-18 MED ORDER — PROPOFOL 10 MG/ML IV BOLUS
INTRAVENOUS | Status: AC
  Filled 2020-06-18: qty 20

## 2020-06-18 MED ORDER — INDOMETHACIN 50 MG RE SUPP: 100.0000 mg | Freq: Once | RECTAL | Status: DC

## 2020-06-18 MED ORDER — INDOMETHACIN 50 MG RE SUPP
RECTAL | Status: DC | PRN
Start: 2020-06-18 — End: 2020-06-18
  Administered 2020-06-18: 100 mg via RECTAL

## 2020-06-18 MED ORDER — LIDOCAINE HCL (CARDIAC) PF 100 MG/5ML IV SOSY
PREFILLED_SYRINGE | INTRAVENOUS | Status: DC | PRN
  Administered 2020-06-18: 60 mg via INTRAVENOUS

## 2020-06-18 MED ORDER — LIDOCAINE HCL (PF) 2 % IJ SOLN
INTRAMUSCULAR | Status: AC
  Filled 2020-06-18: qty 5

## 2020-06-18 MED ORDER — PROPOFOL 10 MG/ML IV BOLUS
INTRAVENOUS | Status: DC | PRN
  Administered 2020-06-18: 30 mg via INTRAVENOUS
  Administered 2020-06-18: 20 mg via INTRAVENOUS
  Administered 2020-06-18: 30 mg via INTRAVENOUS
  Administered 2020-06-18: 10 mg via INTRAVENOUS
  Administered 2020-06-18: 90 mg via INTRAVENOUS
  Administered 2020-06-18: 30 mg via INTRAVENOUS
  Administered 2020-06-18: 20 mg via INTRAVENOUS
  Administered 2020-06-18: 30 mg via INTRAVENOUS
  Administered 2020-06-18: 20 mg via INTRAVENOUS
  Administered 2020-06-18 (×2): 30 mg via INTRAVENOUS
  Administered 2020-06-18: 20 mg via INTRAVENOUS
  Administered 2020-06-18: 40 mg via INTRAVENOUS
  Administered 2020-06-18: 30 mg via INTRAVENOUS

## 2020-06-18 NOTE — Anesthesia Postprocedure Evaluation (Signed)
Anesthesia Post Note  Patient: Ashley Contreras  Procedure(s) Performed: ENDOSCOPIC RETROGRADE CHOLANGIOPANCREATOGRAPHY (ERCP) (N/A )  Patient location during evaluation: PACU Anesthesia Type: General Level of consciousness: awake and alert Pain management: pain level controlled Vital Signs Assessment: post-procedure vital signs reviewed and stable Respiratory status: spontaneous breathing, nonlabored ventilation and respiratory function stable Cardiovascular status: blood pressure returned to baseline and stable Postop Assessment: no apparent nausea or vomiting Anesthetic complications: no   No complications documented.   Last Vitals:  Vitals:   06/18/20 1120 06/18/20 1130  BP: 106/74 118/66  Pulse: (!) 58 (!) 50  Resp: 13 13  Temp:    SpO2: 100% 100%    Last Pain:  Vitals:   06/18/20 1059  TempSrc: Temporal  PainSc:                  Tera Mater

## 2020-06-18 NOTE — Anesthesia Preprocedure Evaluation (Addendum)
Anesthesia Evaluation  Patient identified by MRN, date of birth, ID band Patient awake    Reviewed: Allergy & Precautions, H&P , NPO status , Patient's Chart, lab work & pertinent test results  History of Anesthesia Complications (+) PONVNegative for: history of anesthetic complications  Airway Mallampati: II  TM Distance: >3 FB     Dental  (+) Teeth Intact   Pulmonary asthma , neg sleep apnea, neg COPD,    breath sounds clear to auscultation       Cardiovascular (-) angina(-) Past MI and (-) Cardiac Stents negative cardio ROS  (-) dysrhythmias  Rhythm:regular Rate:Normal     Neuro/Psych  Headaches, negative psych ROS   GI/Hepatic Neg liver ROS, GERD  Controlled,CBD stent   Endo/Other  Hyperthyroidism   Renal/GU negative Renal ROS     Musculoskeletal   Abdominal   Peds  Hematology negative hematology ROS (+)   Anesthesia Other Findings H/o ERCP for CBD stone and pancreatitis, now recovered and presenting for stent removal No recent N/V  Past Medical History: 1989: Abnormal Pap smear     Comment:  s/p colposcopy No date: Allergy     Comment:  hay fever No date: Arthritis No date: Asthma 10/2018: BRCA negative     Comment:  MyRisk neg except ATM VUS No date: Chicken pox No date: Colon polyp No date: Complication of anesthesia No date: Family history of adverse reaction to anesthesia     Comment:  sister - slow to wake No date: Family history of breast cancer No date: Fatty liver     Comment:  seen by hepatology No date: GERD (gastroesophageal reflux disease) 10/2018: Increased risk of breast cancer     Comment:  IBIS=24.7%/riskscore=15.8% No date: Mitral valve prolapse No date: PONV (postoperative nausea and vomiting) No date: Shingles     Comment:  waist, right side No date: UTI (lower urinary tract infection) No date: Wears contact lenses  Past Surgical History: 2008: ABDOMINAL  HYSTERECTOMY No date: BREAST BIOPSY; Left     Comment:  benign 06/2011: calcaneous osteotomy 02/17/2017: COLONOSCOPY WITH PROPOFOL; N/A     Comment:  Procedure: COLONOSCOPY WITH PROPOFOL;  Surgeon: Lucilla Lame, MD;  Location: [place];  Service:               Endoscopy;  Laterality: N/A; 05/17/2020: ENDOSCOPIC RETROGRADE CHOLANGIOPANCREATOGRAPHY (ERCP) WITH  PROPOFOL; N/A     Comment:  Procedure: ENDOSCOPIC RETROGRADE               CHOLANGIOPANCREATOGRAPHY (ERCP) WITH PROPOFOL;  Surgeon:               Lucilla Lame, MD;  Location: [facility] ENDOSCOPY;  Service:               Endoscopy;  Laterality: N/A; 02/17/2017: ESOPHAGOGASTRODUODENOSCOPY (EGD) WITH PROPOFOL; N/A     Comment:  Procedure: ESOPHAGOGASTRODUODENOSCOPY (EGD) WITH               PROPOFOL;  Surgeon: Lucilla Lame, MD;  Location: [place];  Service: Endoscopy;  Laterality: N/A; No date: foot and ankle surgery No date: OOPHORECTOMY 2008: TONSILLECTOMY AND ADENOIDECTOMY  BMI    Body Mass Index: 21.63 kg/m      Reproductive/Obstetrics negative OB ROS  Anesthesia Physical Anesthesia Plan  ASA: II  Anesthesia Plan: General   Post-op Pain Management:    Induction:   PONV Risk Score and Plan: Propofol infusion and TIVA  Airway Management Planned: Simple Face Mask  Additional Equipment:   Intra-op Plan:   Post-operative Plan:   Informed Consent: I have reviewed the patients History and Physical, chart, labs and discussed the procedure including the risks, benefits and alternatives for the proposed anesthesia with the patient or authorized representative who has indicated his/her understanding and acceptance.     Dental Advisory Given  Plan Discussed with: Anesthesiologist, CRNA and Surgeon  Anesthesia Plan Comments:         Anesthesia Quick Evaluation

## 2020-06-18 NOTE — Transfer of Care (Signed)
Immediate Anesthesia Transfer of Care Note  Patient: [name]  Procedure(s) Performed: ENDOSCOPIC RETROGRADE CHOLANGIOPANCREATOGRAPHY (ERCP) (N/A )  Patient Location: PACU and Endoscopy Unit  Anesthesia Type:General  Level of Consciousness: sedated  Airway & Oxygen Therapy: Patient Spontanous Breathing  Post-op Assessment: Report given to RN and Post -op Vital signs reviewed and stable  Post vital signs: Reviewed and stable  Last Vitals:  Vitals Value Taken Time  BP    Temp    Pulse 85 06/18/20 1058  Resp 16 06/18/20 1058  SpO2 100 % 06/18/20 1058  Vitals shown include unvalidated device data.  Last Pain:  Vitals:   06/18/20 0919  TempSrc: Temporal  PainSc: 0-No pain         Complications: No complications documented.

## 2020-06-18 NOTE — Op Note (Addendum)
[facility] Patient Name: Ashley Contreras Procedure Date: 06/18/2020 10:09 AM MRN: [id number] Account #: [id number] Date of Birth: Jan 05, 1961 Admit Type: Outpatient Age: 60 Room: [facility] ENDO ROOM 4 Gender: Female Note Status: Finalized Procedure:             ERCP Indications:           Stent removal Providers:             Lucilla Lame MD, MD Referring MD:          Janine Ores. Rosanna Randy, MD (Referring MD) Medicines:             Propofol per Anesthesia Complications:         No immediate complications. Procedure:             Pre-Anesthesia Assessment:                        - Prior to the procedure, a History and Physical was                         performed, and patient medications and allergies were                         reviewed. The patient's tolerance of previous                         anesthesia was also reviewed. The risks and benefits                         of the procedure and the sedation options and risks                         were discussed with the patient. All questions were                         answered, and informed consent was obtained. Prior                         Anticoagulants: The patient has taken no previous                         anticoagulant or antiplatelet agents. ASA Grade                         Assessment: II - A patient with mild systemic disease.                         After reviewing the risks and benefits, the patient                         was deemed in satisfactory condition to undergo the                         procedure.                        After obtaining informed consent, the scope was passed  under direct vision. Throughout the procedure, the                         patient's blood pressure, pulse, and oxygen                         saturations were monitored continuously. The [employer] D single use duodenoscope was                          introduced through the mouth, and used to inject                         contrast into and used to inject contrast into the                         bile duct. The ERCP was accomplished without                         difficulty. The patient tolerated the procedure well. Findings:      A scout film of the abdomen was obtained. One stent ending in the main       bile duct was seen. One plastic stent originating in the biliary tree       was emerging from the major papilla. A wire was passed into the biliary       tree. One stent was removed from the common bile duct using a snare. The       bile duct was deeply cannulated with the short-nosed traction       sphincterotome. Contrast was injected. I personally interpreted the bile       duct images. There was brisk flow of contrast through the ducts. Image       quality was excellent. Contrast extended to the entire biliary tree. The       lower third of the main bile duct contained filling defect(s). A 7 mm       biliary sphincterotomy was made with a traction (standard)       sphincterotome using ERBE electrocautery. There was no       post-sphincterotomy bleeding. The biliary tree was swept with a 15 mm       balloon starting at the bifurcation. Sludge was swept from the duct. Impression:            - One stent from the biliary tree was seen in the                         major papilla.                        - A filling defect was seen on the cholangiogram.                        - One stent was removed from the common bile duct.                        - A biliary sphincterotomy was performed.                        -  The biliary tree was swept and sludge was found. Recommendation:        - Discharge patient to home.                        - Clear liquid diet today. Procedure Code(s):     --- Professional ---                        [phone], Endoscopic retrograde cholangiopancreatography                         (ERCP); with removal of  foreign body(s) or stent(s)                         from biliary/pancreatic duct(s)                        43264, Endoscopic retrograde cholangiopancreatography                         (ERCP); with removal of calculi/debris from                         biliary/pancreatic duct(s)                        43262, Endoscopic retrograde cholangiopancreatography                         (ERCP); with sphincterotomy/papillotomy                        [phone], Endoscopic catheterization of the biliary                         ductal system, radiological supervision and                         interpretation Diagnosis Code(s):     --- Professional ---                        W10.93, Encounter for fitting and adjustment of other                         gastrointestinal appliance and device                        R93.2, Abnormal findings on diagnostic imaging of                         liver and biliary tract CPT copyright 2019 American Medical Association. All rights reserved. The codes documented in this report are preliminary and upon coder review may  be revised to meet current compliance requirements. Lucilla Lame MD, MD 06/18/2020 10:55:33 AM This report has been signed electronically. Number of Addenda: 0 Note Initiated On: 06/18/2020 10:09 AM Estimated Blood Loss:  Estimated blood loss: none.      [facility]

## 2020-06-18 NOTE — Interval H&P Note (Signed)
Lucilla Lame, MD [address], [place] [address] Phone:[phone] Fax : [phone]  Primary Care Physician:  Jerrol Banana., MD Primary Gastroenterologist:  Dr. Allen Norris  Pre-Procedure History & Physical: HPI:  Ashley Contreras is a 60 y.o. female is here for an ERCP.   Past Medical History:  Diagnosis Date   Abnormal Pap smear 1989   s/p colposcopy   Allergy    hay fever   Arthritis    Asthma    BRCA negative 10/2018   MyRisk neg except ATM VUS   Chicken pox    Colon polyp    Complication of anesthesia    Family history of adverse reaction to anesthesia    sister - slow to wake   Family history of breast cancer    Fatty liver    seen by hepatology   GERD (gastroesophageal reflux disease)    Increased risk of breast cancer 10/2018   IBIS=24.7%/riskscore=15.8%   Mitral valve prolapse    PONV (postoperative nausea and vomiting)    Shingles    waist, right side   UTI (lower urinary tract infection)    Wears contact lenses     Past Surgical History:  Procedure Laterality Date   ABDOMINAL HYSTERECTOMY  2008   BREAST BIOPSY Left    benign   calcaneous osteotomy  06/2011   COLONOSCOPY WITH PROPOFOL N/A 02/17/2017   Procedure: COLONOSCOPY WITH PROPOFOL;  Surgeon: Lucilla Lame, MD;  Location: [facility];  Service: Endoscopy;  Laterality: N/A;   ENDOSCOPIC RETROGRADE CHOLANGIOPANCREATOGRAPHY (ERCP) WITH PROPOFOL N/A 05/17/2020   Procedure: ENDOSCOPIC RETROGRADE CHOLANGIOPANCREATOGRAPHY (ERCP) WITH PROPOFOL;  Surgeon: Lucilla Lame, MD;  Location: [facility];  Service: Endoscopy;  Laterality: N/A;   ESOPHAGOGASTRODUODENOSCOPY (EGD) WITH PROPOFOL N/A 02/17/2017   Procedure: ESOPHAGOGASTRODUODENOSCOPY (EGD) WITH PROPOFOL;  Surgeon: Lucilla Lame, MD;  Location: [facility];  Service: Endoscopy;  Laterality: N/A;   foot and ankle surgery     OOPHORECTOMY     TONSILLECTOMY AND ADENOIDECTOMY  2008    Prior to Admission  medications   Medication Sig Start Date End Date Taking? Authorizing Provider  fluticasone (FLONASE) 50 MCG/ACT nasal spray INSTILL 2 SPRAYS INTO BOTH NOSTRILS DAILY 09/23/19 09/22/20 Yes Jerrol Banana., MD  loratadine (CLARITIN) 10 MG tablet Take 10 mg by mouth daily.   Yes [provider]  pantoprazole (PROTONIX) 40 MG tablet TAKE 1 TABLET BY MOUTH 2 TIMES DAILY. Patient taking differently: Take 40 mg by mouth 2 (two) times daily. 10/17/19  Yes Jerrol Banana., MD  acetaminophen (TYLENOL) 500 MG tablet Take 500 mg by mouth every 4 (four) hours as needed.    [provider]  calcium-vitamin D (OSCAL WITH D) 500-200 MG-UNIT TABS tablet Take by mouth.    [provider]  Cholecalciferol 50 MCG (2000 UT) TABS Take by mouth.    [provider]  estradiol (VIVELLE-DOT) 0.0375 MG/24HR PLACE 1 PATCH ONTO THE SKIN 2 TIMES A WEEK. 11/11/19 8/52/77  Copland, Elmo Putt B, PA-C  ibuprofen (ADVIL) 800 MG tablet TAKE 1 TABLET (800 MG TOTAL) BY MOUTH EVERY 8 (EIGHT) HOURS AS NEEDED. 05/19/20 05/19/21  Tylene Fantasia, PA-C  Lifitegrast 5 % SOLN PLACE 1 DROP INTO BOTH EYES TWICE A DAY 06/20/19 07/09/20  Leandrew Koyanagi, MD  Multiple Vitamins-Minerals (PRESERVISION AREDS 2 PO) Take by mouth 2 (two) times daily.    [provider]  polyethylene glycol (MIRALAX / GLYCOLAX) 17 g packet Take 17  g by mouth daily as needed for moderate constipation. 05/19/20   Regalado, Belkys A, MD  Tavaborole (KERYDIN) 5 % SOLN Apply to affected toenail nightly until improved. 06/10/20   Brendolyn Patty, MD  triamcinolone (KENALOG) 0.1 % APPLY 1 TO 2 TIMES DAILY TO AFFECTED AREAS AS NEEDED FOR ITCH. AVOID APPLYING TO FACE, GROIN, AND AXILLA. 10/29/19 10/28/20  Brendolyn Patty, MD  valACYclovir (VALTREX) 1000 MG tablet TAKE 2 TABLETS AT THE ONSET OF A FEVER BLISTER AND THEN TAKE 2 TABLETS 12 HOURS LATER. USE AS NEEDED Patient taking differently: Take 2,000 mg by mouth 2 (two) times daily. Take  2 tablets at the onset of a fever blister and then take 2 tablets 12 hours later. As needed 12/23/19 12/22/20  Jerrol Banana., MD    Allergies as of 06/15/2020 - Review Complete 06/11/2020  Allergen Reaction Noted   Penicillins Swelling 10/21/2011   Cinnamon Swelling 01/20/2015    Family History  Problem Relation Age of Onset   Arthritis Mother    Hypertension Mother    Mental illness Mother    Diabetes Mother    Breast cancer Mother 50       right breast   Hyperlipidemia Father    Heart disease Father    Cancer Father        lung   Mental illness Sister    Myasthenia gravis Sister    Multiple sclerosis Sister    Multiple sclerosis Other    Stroke Maternal Aunt    Breast cancer Maternal Aunt 14       right breast   Stroke Maternal Grandmother    Hypertension Maternal Grandmother    Diabetes Maternal Grandmother    Breast cancer Maternal Aunt 72       right breast    Social History   Socioeconomic History   Marital status: Married    Spouse name: Not on file   Number of children: 2   Years of education: Not on file   Highest education level: Not on file  Occupational History   Occupation: Transport planner    Employer: [employer]  Tobacco Use   Smoking status: Never Smoker   Smokeless tobacco: Never Used  Scientific laboratory technician Use: Never used  Substance and Sexual Activity   Alcohol use: No   Drug use: No   Sexual activity: Yes    Birth control/protection: Surgical    Comment: Hysterectomy  Other Topics Concern   Not on file  Social History Narrative   Lives in [place], 2 children. No pets.      Work - [employer]      Diet - regular, healthy   Exercise -bike sometimes, limited by injury   Social Determinants of Radio broadcast assistant Strain: Not on file  Food Insecurity: Not on file  Transportation Needs: Not on file  Physical Activity: Not on file  Stress: Not on file  Social Connections: Not on file  Intimate Partner Violence: Not  on file    Review of Systems: See HPI, otherwise negative ROS  Physical Exam: There were no vitals taken for this visit. General:   Alert,  pleasant and cooperative in NAD Head:  Normocephalic and atraumatic. Neck:  Supple; no masses or thyromegaly. Lungs:  Clear throughout to auscultation.    Heart:  Regular rate and rhythm. Abdomen:  Soft, nontender and nondistended. Normal bowel sounds, without guarding, and without rebound.   Neurologic:  Alert and  oriented x4;  grossly normal neurologically.  Impression/Plan: Ashley Contreras is here for an ERCP to be performed for stent removal  Risks, benefits, limitations, and alternatives regarding  ERCP have been reviewed with the patient.  Questions have been answered.  All parties agreeable.   Lucilla Lame, MD  06/18/2020, 9:18 AM

## 2020-06-19 ENCOUNTER — Encounter: Payer: Self-pay | Admitting: Gastroenterology

## 2020-06-22 ENCOUNTER — Other Ambulatory Visit: Payer: Self-pay

## 2020-06-22 ENCOUNTER — Ambulatory Visit
Admission: RE | Admit: 2020-06-22 | Discharge: 2020-06-22 | Disposition: A | Discharge status: Home / Self Care | Payer: 59 | Source: Ambulatory Visit | Attending: Obstetrics and Gynecology | Admitting: Obstetrics and Gynecology

## 2020-06-22 DIAGNOSIS — Z1231 Encounter for screening mammogram for malignant neoplasm of breast: Secondary | ICD-10-CM | POA: Insufficient documentation

## 2020-06-22 DIAGNOSIS — H524 Presbyopia: Secondary | ICD-10-CM | POA: Diagnosis not present

## 2020-07-09 ENCOUNTER — Other Ambulatory Visit: Payer: Self-pay

## 2020-07-09 ENCOUNTER — Encounter: Payer: Self-pay | Admitting: Family Medicine

## 2020-07-09 ENCOUNTER — Ambulatory Visit (INDEPENDENT_AMBULATORY_CARE_PROVIDER_SITE_OTHER): Payer: 59 | Admitting: Family Medicine

## 2020-07-09 VITALS — BP 115/73 | HR 79 | Temp 98.5°F | Resp 16 | Ht 66.0 in | Wt 132.0 lb

## 2020-07-09 DIAGNOSIS — E0590 Thyrotoxicosis, unspecified without thyrotoxic crisis or storm: Secondary | ICD-10-CM | POA: Diagnosis not present

## 2020-07-09 DIAGNOSIS — E559 Vitamin D deficiency, unspecified: Secondary | ICD-10-CM | POA: Diagnosis not present

## 2020-07-09 DIAGNOSIS — J309 Allergic rhinitis, unspecified: Secondary | ICD-10-CM | POA: Diagnosis not present

## 2020-07-09 DIAGNOSIS — R748 Abnormal levels of other serum enzymes: Secondary | ICD-10-CM | POA: Diagnosis not present

## 2020-07-09 DIAGNOSIS — Z0000 Encounter for general adult medical examination without abnormal findings: Secondary | ICD-10-CM

## 2020-07-09 DIAGNOSIS — B001 Herpesviral vesicular dermatitis: Secondary | ICD-10-CM

## 2020-07-09 DIAGNOSIS — R202 Paresthesia of skin: Secondary | ICD-10-CM | POA: Diagnosis not present

## 2020-07-09 DIAGNOSIS — K2950 Unspecified chronic gastritis without bleeding: Secondary | ICD-10-CM

## 2020-07-09 MED ORDER — VALACYCLOVIR HCL 1 G PO TABS
ORAL_TABLET | ORAL | 1 refills | Status: AC
  Filled 2020-07-09: qty 30, 8d supply, fill #0
  Filled 2021-02-10: qty 30, 8d supply, fill #1

## 2020-07-09 MED ORDER — PANTOPRAZOLE SODIUM 40 MG PO TBEC
1.0000 | DELAYED_RELEASE_TABLET | Freq: Two times a day (BID) | ORAL | 1 refills | Status: DC
  Filled 2020-07-09: qty 180, 90d supply, fill #0

## 2020-07-09 MED ORDER — FLUTICASONE PROPIONATE 50 MCG/ACT NA SUSP
2.0000 | Freq: Every day | NASAL | 2 refills | Status: DC
  Filled 2020-07-09: qty 16, 30d supply, fill #0
  Filled 2020-08-20: qty 16, 30d supply, fill #1
  Filled 2021-01-12: qty 16, 30d supply, fill #2

## 2020-07-09 NOTE — Progress Notes (Signed)
Complete physical exam   Patient: Ashley Contreras   DOB: 1960-09-13   60 y.o. Female  MRN: [id number] Visit Date: 07/09/2020  Today's healthcare provider: Megan Mans, MD   Chief Complaint  Patient presents with  . Annual Exam   Subjective    Ashley Contreras is a 60 y.o. female who presents today for a complete physical exam.  She reports consuming a low fat diet.She generally feels well. She reports sleeping fairly well. She does not have additional problems to discuss today.   Last CPE: 07/04/2019 Last pap smear: 11/06/2018- normal Bone Density: 08/16/2017 Last mammogram: 06/22/2020 BI-RADS CAT1 Negative (GYN) Last Colonoscopy: 02/17/2017- non bleeding internal hemorrhoids Last Tetanus: 09/22/2010    Past Medical History:  Diagnosis Date  . Abnormal Pap smear 1989   s/p colposcopy  . Allergy    hay fever  . Arthritis   . Asthma   . BRCA negative 10/2018   MyRisk neg except ATM VUS  . Chicken pox   . Colon polyp   . Complication of anesthesia   . Family history of adverse reaction to anesthesia    sister - slow to wake  . Family history of breast cancer   . Fatty liver    seen by hepatology  . GERD (gastroesophageal reflux disease)   . Increased risk of breast cancer 10/2018   IBIS=24.7%/riskscore=15.8%  . Mitral valve prolapse   . PONV (postoperative nausea and vomiting)   . Shingles    waist, right side  . UTI (lower urinary tract infection)   . Wears contact lenses    Past Surgical History:  Procedure Laterality Date  . ABDOMINAL HYSTERECTOMY  2008  . BREAST BIOPSY Left    benign  . calcaneous osteotomy  06/2011  . COLONOSCOPY WITH PROPOFOL N/A 02/17/2017   Procedure: COLONOSCOPY WITH PROPOFOL;  Surgeon: Midge Minium, MD;  Location: [facility];  Service: Endoscopy;  Laterality: N/A;  . ENDOSCOPIC RETROGRADE CHOLANGIOPANCREATOGRAPHY (ERCP) WITH PROPOFOL N/A 05/17/2020   Procedure: ENDOSCOPIC RETROGRADE CHOLANGIOPANCREATOGRAPHY  (ERCP) WITH PROPOFOL;  Surgeon: Midge Minium, MD;  Location: [facility];  Service: Endoscopy;  Laterality: N/A;  . ERCP N/A 06/18/2020   Procedure: ENDOSCOPIC RETROGRADE CHOLANGIOPANCREATOGRAPHY (ERCP);  Surgeon: Midge Minium, MD;  Location: [facility];  Service: Endoscopy;  Laterality: N/A;  . ESOPHAGOGASTRODUODENOSCOPY (EGD) WITH PROPOFOL N/A 02/17/2017   Procedure: ESOPHAGOGASTRODUODENOSCOPY (EGD) WITH PROPOFOL;  Surgeon: Midge Minium, MD;  Location: [facility];  Service: Endoscopy;  Laterality: N/A;  . foot and ankle surgery    . OOPHORECTOMY    . TONSILLECTOMY AND ADENOIDECTOMY  2008   Social History   Socioeconomic History  . Marital status: Married    Spouse name: Not on file  . Number of children: 2  . Years of education: Not on file  . Highest education level: Not on file  Occupational History  . Occupation: Catering manager: [employer]  Tobacco Use  . Smoking status: Never Smoker  . Smokeless tobacco: Never Used  Vaping Use  . Vaping Use: Never used  Substance and Sexual Activity  . Alcohol use: No  . Drug use: No  . Sexual activity: Yes    Birth control/protection: Surgical    Comment: Hysterectomy  Other Topics Concern  . Not on file  Social History Narrative   Lives in [place], 2 children. No pets.      Work - [employer]      Diet - regular,  healthy   Exercise -bike sometimes, limited by injury   Social Determinants of Health   Financial Resource Strain: Not on file  Food Insecurity: Not on file  Transportation Needs: Not on file  Physical Activity: Not on file  Stress: Not on file  Social Connections: Not on file  Intimate Partner Violence: Not on file   Family Status  Relation Name Status  . Mother  Deceased  . Father  Deceased  . Sister  Alive  . Sister  Alive  . Brother  Alive  . Sister  Alive  . Brother  Alive  . Other [employer]  . Mat Aunt  Deceased  . MGM  Deceased  . Mat Aunt  Deceased   Family  History  Problem Relation Age of Onset  . Arthritis Mother   . Hypertension Mother   . Mental illness Mother   . Diabetes Mother   . Breast cancer Mother 26       right breast  . Hyperlipidemia Father   . Heart disease Father   . Cancer Father        lung  . Mental illness Sister   . Myasthenia gravis Sister   . Multiple sclerosis Sister   . Multiple sclerosis Other   . Stroke Maternal Aunt   . Breast cancer Maternal Aunt 70       right breast  . Stroke Maternal Grandmother   . Hypertension Maternal Grandmother   . Diabetes Maternal Grandmother   . Breast cancer Maternal Aunt 72       right breast   Allergies  Allergen Reactions  . Penicillins Swelling  . Cinnamon Swelling    Mouth and tongue become raw Swelling of tongue Swelling of tongue Mouth and tongue become raw     Patient Care Team: Maple Hudson., MD as PCP - General (Family Medicine)   Medications: Outpatient Medications Prior to Visit  Medication Sig  . acetaminophen (TYLENOL) 500 MG tablet Take 500 mg by mouth every 4 (four) hours as needed.  . calcium-vitamin D (OSCAL WITH D) 500-200 MG-UNIT TABS tablet Take by mouth.  . Cholecalciferol 50 MCG (2000 UT) TABS Take by mouth.  . estradiol (VIVELLE-DOT) 0.0375 MG/24HR PLACE 1 PATCH ONTO THE SKIN 2 TIMES A WEEK.  . fluticasone (FLONASE) 50 MCG/ACT nasal spray INSTILL 2 SPRAYS INTO BOTH NOSTRILS DAILY  . ibuprofen (ADVIL) 800 MG tablet TAKE 1 TABLET (800 MG TOTAL) BY MOUTH EVERY 8 (EIGHT) HOURS AS NEEDED.  Marland Kitchen Lifitegrast 5 % SOLN PLACE 1 DROP INTO BOTH EYES TWICE A DAY  . loratadine (CLARITIN) 10 MG tablet Take 10 mg by mouth daily.  . Multiple Vitamins-Minerals (PRESERVISION AREDS 2 PO) Take by mouth 2 (two) times daily.  . pantoprazole (PROTONIX) 40 MG tablet TAKE 1 TABLET BY MOUTH 2 TIMES DAILY. (Patient taking differently: Take 40 mg by mouth 2 (two) times daily.)  . polyethylene glycol (MIRALAX / GLYCOLAX) 17 g packet Take 17 g by mouth daily as  needed for moderate constipation.  Laurann Montana (KERYDIN) 5 % SOLN Apply to affected toenail nightly until improved.  . triamcinolone (KENALOG) 0.1 % APPLY 1 TO 2 TIMES DAILY TO AFFECTED AREAS AS NEEDED FOR ITCH. AVOID APPLYING TO FACE, GROIN, AND AXILLA.  Marland Kitchen valACYclovir (VALTREX) 1000 MG tablet TAKE 2 TABLETS AT THE ONSET OF A FEVER BLISTER AND THEN TAKE 2 TABLETS 12 HOURS LATER. USE AS NEEDED (Patient taking differently: Take 2,000 mg by mouth 2 (two) times daily.  Take 2 tablets at the onset of a fever blister and then take 2 tablets 12 hours later. As needed)   No facility-administered medications prior to visit.    Review of Systems  Constitutional: Negative.   HENT: Negative.   Eyes: Negative.   Respiratory: Negative.   Cardiovascular: Negative.   Gastrointestinal: Negative.   Endocrine: Negative.   Genitourinary: Negative.   Musculoskeletal: Negative.   Skin: Negative.   Allergic/Immunologic: Positive for environmental allergies.  Neurological: Negative.   Hematological: Negative.   Psychiatric/Behavioral: Negative.        Objective    BP 115/73   Pulse 79   Temp 98.5 F (36.9 C)   Resp 16   Ht 5\' 6"  (1.676 m)   Wt 132 lb (59.9 kg)   BMI 21.31 kg/m  BP Readings from Last 3 Encounters:  07/09/20 115/73  06/18/20 118/66  06/11/20 103/61   Wt Readings from Last 3 Encounters:  07/09/20 132 lb (59.9 kg)  06/18/20 134 lb (60.8 kg)  06/11/20 135 lb (61.2 kg)      Physical Exam Vitals reviewed.  Constitutional:      Appearance: Normal appearance.  HENT:     Right Ear: Tympanic membrane, ear canal and external ear normal.     Left Ear: Tympanic membrane, ear canal and external ear normal.     Nose: Nose normal.     Mouth/Throat:     Mouth: Mucous membranes are dry.     Pharynx: Oropharynx is clear.  Eyes:     Extraocular Movements: Extraocular movements intact.     Conjunctiva/sclera: Conjunctivae normal.     Pupils: Pupils are equal, round, and reactive to  light.  Cardiovascular:     Rate and Rhythm: Normal rate and regular rhythm.     Pulses: Normal pulses.     Heart sounds: Normal heart sounds.  Pulmonary:     Effort: Pulmonary effort is normal.     Breath sounds: Normal breath sounds.  Chest:  Breasts: Breasts are symmetrical.     Right: Normal. No swelling, bleeding, inverted nipple, mass, nipple discharge, skin change or tenderness.     Left: Normal. No swelling, bleeding, inverted nipple, mass, nipple discharge, skin change or tenderness.    Abdominal:     Palpations: Abdomen is soft.  Musculoskeletal:     Cervical back: Neck supple.  Skin:    General: Skin is warm.  Neurological:     General: No focal deficit present.     Mental Status: She is alert and oriented to person, place, and time.     Comments: Mild decrease sensation to monofilament and distal right foot  Psychiatric:        Mood and Affect: Mood normal.        Behavior: Behavior normal.        Thought Content: Thought content normal.        Judgment: Judgment normal.       Last depression screening scores PHQ 2/9 Scores 07/09/2020 01/06/2020 07/04/2019  PHQ - 2 Score 0 0 0  PHQ- 9 Score 0 1 0   Last fall risk screening Fall Risk  06/04/2020  Falls in the past year? 0  Number falls in past yr: -  Injury with Fall? -  Follow up -   Last Audit-C alcohol use screening Alcohol Use Disorder Test (AUDIT) 07/09/2020  1. How often do you have a drink containing alcohol? 1  2. How many drinks containing alcohol do you have  on a typical day when you are drinking? 0  3. How often do you have six or more drinks on one occasion? 0  AUDIT-C Score 1  Alcohol Brief Interventions/Follow-up -   A score of 3 or more in women, and 4 or more in men indicates increased risk for alcohol abuse, EXCEPT if all of the points are from question 1   No results found for any visits on 07/09/20.  Assessment & Plan    Routine Health Maintenance and Physical Exam  Exercise  Activities and Dietary recommendations Goals   None     Immunization History  Administered Date(s) Administered  . Hep A / Hep B 04/29/2016, 06/02/2016, 11/09/2016  . Influenza Whole 11/22/2014, 11/26/2015  . Influenza-Unspecified 10/23/2011, 11/26/2014, 11/10/2016, 11/19/2019  . PFIZER(Purple Top)SARS-COV-2 Vaccination 02/18/2019, 03/11/2019, 12/13/2019  . Tdap 09/22/2010    Health Maintenance  Topic Date Due  . COVID-19 Vaccine (4 - Booster for Pfizer series) 03/14/2020  . INFLUENZA VACCINE  09/21/2020  . TETANUS/TDAP  09/21/2020  . PAP SMEAR-Modifier  11/05/2021  . MAMMOGRAM  06/23/2022  . COLONOSCOPY (Pts 45-44yrs Insurance coverage will need to be confirmed)  02/18/2027  . Hepatitis C Screening  Completed  . HIV Screening  Completed  . HPV VACCINES  Aged Out    Discussed health benefits of physical activity, and encouraged her to engage in regular exercise appropriate for her age and condition.  1. Annual physical exam Well woman exam per [facility].  2. Subclinical hyperthyroidism  - TSH - T3, free - T4, free  3. Vitamin D deficiency  - VITAMIN D 25 Hydroxy (Vit-D Deficiency, Fractures)  4. Tingling sensation Possible neuropathy of the foot.  Possibly secondary to old trauma - B12 and Folate Panel  5. Fever blister All other Derm issues per Dr. Roseanne Reno from dermatology - valACYclovir (VALTREX) 1000 MG tablet; TAKE 2 TABLETS AT THE ONSET OF A FEVER BLISTER AND THEN TAKE 2 TABLETS 12 HOURS LATER. USE AS NEEDED  Dispense: 30 tablet; Refill: 1  6. Chronic gastritis without bleeding, unspecified gastritis type  - Lipase - pantoprazole (PROTONIX) 40 MG tablet; Take 1 tablet (40 mg total) by mouth 2 (two) times daily.  Dispense: 180 tablet; Refill: 1  7. Allergic rhinitis, unspecified seasonality, unspecified trigger  - fluticasone (FLONASE) 50 MCG/ACT nasal spray; Place 2 sprays into both nostrils daily.  Dispense: 16 g; Refill: 2  8. Abnormal liver  enzymes Recent gallbladder pancreatitis. - CBC with Differential/Platelet - Comprehensive metabolic panel - Lipid panel  No follow-ups on file.     I, Megan Mans, MD, have reviewed all documentation for this visit. The documentation on 07/17/20 for the exam, diagnosis, procedures, and orders are all accurate and complete.    Yunus Stoklosa Wendelyn Breslow, MD  [facility] [phone] (phone) [phone] (fax)  [facility]

## 2020-07-10 LAB — COMPREHENSIVE METABOLIC PANEL
ALT: 17 IU/L (ref 0–32)
AST: 15 IU/L (ref 0–40)
Albumin/Globulin Ratio: 2.1 (ref 1.2–2.2)
Albumin: 4.4 g/dL (ref 3.8–4.9)
Alkaline Phosphatase: 126 IU/L — ABNORMAL HIGH (ref 44–121)
BUN/Creatinine Ratio: 26 — ABNORMAL HIGH (ref 9–23)
BUN: 19 mg/dL (ref 6–24)
Bilirubin Total: 0.4 mg/dL (ref 0.0–1.2)
CO2: 22 mmol/L (ref 20–29)
Calcium: 9.6 mg/dL (ref 8.7–10.2)
Chloride: 105 mmol/L (ref 96–106)
Creatinine, Ser: 0.74 mg/dL (ref 0.57–1.00)
Globulin, Total: 2.1 g/dL (ref 1.5–4.5)
Glucose: 91 mg/dL (ref 65–99)
Potassium: 5.4 mmol/L — ABNORMAL HIGH (ref 3.5–5.2)
Sodium: 143 mmol/L (ref 134–144)
Total Protein: 6.5 g/dL (ref 6.0–8.5)
eGFR: 93 mL/min/{1.73_m2} (ref >59)

## 2020-07-10 LAB — TSH: TSH: 0.549 u[IU]/mL (ref 0.450–4.500)

## 2020-07-10 LAB — LIPID PANEL
Chol/HDL Ratio: 3.4 ratio (ref 0.0–4.4)
Cholesterol, Total: 191 mg/dL (ref 100–199)
HDL: 57 mg/dL (ref >39)
LDL Chol Calc (NIH): 118 mg/dL — ABNORMAL HIGH (ref 0–99)
Triglycerides: 90 mg/dL (ref 0–149)
VLDL Cholesterol Cal: 16 mg/dL (ref 5–40)

## 2020-07-10 LAB — T3, FREE: T3, Free: 2.9 pg/mL (ref 2.0–4.4)

## 2020-07-10 LAB — CBC WITH DIFFERENTIAL/PLATELET
Basophils Absolute: 0 10*3/uL (ref 0.0–0.2)
Basos: 1 %
EOS (ABSOLUTE): 0.1 10*3/uL (ref 0.0–0.4)
Eos: 3 %
Hematocrit: 42.1 % (ref 34.0–46.6)
Hemoglobin: 14.3 g/dL (ref 11.1–15.9)
Immature Grans (Abs): 0 10*3/uL (ref 0.0–0.1)
Immature Granulocytes: 0 %
Lymphocytes Absolute: 1.2 10*3/uL (ref 0.7–3.1)
Lymphs: 30 %
MCH: 31.8 pg (ref 26.6–33.0)
MCHC: 34 g/dL (ref 31.5–35.7)
MCV: 94 fL (ref 79–97)
Monocytes Absolute: 0.5 10*3/uL (ref 0.1–0.9)
Monocytes: 11 %
Neutrophils Absolute: 2.3 10*3/uL (ref 1.4–7.0)
Neutrophils: 55 %
Platelets: 207 10*3/uL (ref 150–450)
RBC: 4.49 x10E6/uL (ref 3.77–5.28)
RDW: 12.7 % (ref 11.7–15.4)
WBC: 4.1 10*3/uL (ref 3.4–10.8)

## 2020-07-10 LAB — B12 AND FOLATE PANEL
Folate: 7 ng/mL (ref >3.0)
Vitamin B-12: 461 pg/mL (ref 232–1245)

## 2020-07-10 LAB — VITAMIN D 25 HYDROXY (VIT D DEFICIENCY, FRACTURES): Vit D, 25-Hydroxy: 30.8 ng/mL (ref 30.0–100.0)

## 2020-07-10 LAB — LIPASE: Lipase: 66 U/L (ref 14–72)

## 2020-07-10 LAB — T4, FREE: Free T4: 1.13 ng/dL (ref 0.82–1.77)

## 2020-07-15 ENCOUNTER — Telehealth: Payer: Self-pay

## 2020-07-15 ENCOUNTER — Other Ambulatory Visit (HOSPITAL_COMMUNITY): Payer: Self-pay

## 2020-07-15 NOTE — Telephone Encounter (Signed)
error 

## 2020-07-16 ENCOUNTER — Other Ambulatory Visit: Payer: Self-pay

## 2020-07-16 ENCOUNTER — Telehealth: Payer: Self-pay

## 2020-07-16 NOTE — Telephone Encounter (Signed)
Copied from [place] [id number]. Topic: General - Other >> Jul 16, 2020  9:46 AM Yvette Rack wrote: Reason for CRM: Pt stated she was returning call to [facility]. Pt requests call back. Cb# [phone]

## 2020-07-17 ENCOUNTER — Other Ambulatory Visit: Payer: Self-pay

## 2020-07-17 MED ORDER — LIFITEGRAST 5 % OP SOLN
OPHTHALMIC | 6 refills | Status: DC
  Filled 2020-07-17: qty 60, 30d supply, fill #0
  Filled 2020-08-31 – 2020-09-01 (×2): qty 60, 30d supply, fill #1
  Filled 2020-10-30: qty 60, 30d supply, fill #2
  Filled 2020-12-18: qty 60, 30d supply, fill #3
  Filled 2021-02-10: qty 60, 30d supply, fill #4
  Filled 2021-05-15: qty 60, 30d supply, fill #5
  Filled 2021-07-01: qty 60, 30d supply, fill #6

## 2020-07-21 ENCOUNTER — Other Ambulatory Visit: Payer: Self-pay

## 2020-07-21 MED ORDER — XIIDRA 5 % OP SOLN
OPHTHALMIC | 4 refills | Status: DC
  Filled 2020-07-21: qty 180, 90d supply, fill #0

## 2020-08-10 ENCOUNTER — Other Ambulatory Visit: Payer: Self-pay

## 2020-08-10 ENCOUNTER — Ambulatory Visit (INDEPENDENT_AMBULATORY_CARE_PROVIDER_SITE_OTHER): Payer: 59 | Admitting: Dermatology

## 2020-08-10 DIAGNOSIS — L821 Other seborrheic keratosis: Secondary | ICD-10-CM

## 2020-08-10 DIAGNOSIS — B351 Tinea unguium: Secondary | ICD-10-CM

## 2020-08-10 DIAGNOSIS — L719 Rosacea, unspecified: Secondary | ICD-10-CM

## 2020-08-10 DIAGNOSIS — L814 Other melanin hyperpigmentation: Secondary | ICD-10-CM | POA: Diagnosis not present

## 2020-08-10 MED ORDER — METRONIDAZOLE 0.75 % EX CREA
TOPICAL_CREAM | CUTANEOUS | 1 refills | Status: DC
  Filled 2020-08-10: qty 45, 30d supply, fill #0

## 2020-08-10 NOTE — Progress Notes (Signed)
   Follow-Up Visit   Subjective  Ashley Contreras is a 60 y.o. female who presents for the following: Follow-up (Patient here for 2 month follow-up. She had an AK treated on the right dorsum nose, improved. She had a cosmetic lentigo treated on the left lateral cheek. There is still a little discoloration in this area, but improved. She is using Kerydin to the left great toenail, some improvement. ). She also has a dark spot on her upper back that her husband noticed.    The following portions of the chart were reviewed this encounter and updated as appropriate:        Review of Systems:  No other skin or systemic complaints except as noted in HPI or Assessment and Plan.  Objective  Well appearing patient in no apparent distress; mood and affect are within normal limits.  A focused examination was performed including face, toenail. Relevant physical exam findings are noted in the Assessment and Plan.  Left Lateral Cheek Light pink violaceous macule. No obvious residual pigment.  Improved when compared to photo  Left Great Toenail Distal onycholysis and yellow discoloration. Improving when compared to photo  Nose Inflammatory papule and telangiectasias nasal tip.  Spinal upper back 5.69mm waxy tan macule   Assessment & Plan  Lentigines Left Lateral Cheek  Improving with PIH from cryotherapy.  Area should fade over time. Will observe.   Recommend daily broad spectrum sunscreen SPF 30+ to sun-exposed areas, reapply every 2 hours as needed. Call for new or changing lesions.  Staying in the shade or wearing long sleeves, sun glasses (UVA+UVB protection) and wide brim hats (4-inch brim around the entire circumference of the hat) are also recommended for sun protection.    Tinea unguium Left Great Toenail  Improving  Continue Kerydin solution QHS. Pt has.   Related Medications Tavaborole (KERYDIN) 5 % SOLN Apply to affected toenail nightly until  improved.  Rosacea Nose  Mild flare, possibly due to mask wearing  Rosacea is a chronic progressive skin condition usually affecting the face of adults, causing redness and/or acne bumps. It is treatable but not curable. It sometimes affects the eyes (ocular rosacea) as well. It may respond to topical and/or systemic medication and can flare with stress, sun exposure, alcohol, exercise and some foods.  Daily application of broad spectrum spf 30+ sunscreen to face is recommended to reduce flares.  Start metronidazole 0.75% cream Apply QD/BID to nose for rosacea dsp 45g 1Rf.  metroNIDAZOLE (METROCREAM) 0.75 % cream - Nose Apply to nose 1-2 times a day for rosacea.  Seborrheic keratosis Spinal upper back  Reassured benign age-related growth.  Recommend observation.  Discussed cryotherapy if spot(s) become irritated or inflamed.  Return as scheduled for TBSE.  IJamesetta Orleans, CMA, am acting as scribe for Brendolyn Patty, MD . Documentation: I have reviewed the above documentation for accuracy and completeness, and I agree with the above.  Brendolyn Patty MD

## 2020-08-10 NOTE — Patient Instructions (Addendum)
Rosacea  What is rosacea? Rosacea (say: ro-zay-sha) is a common skin disease that usually begins as a trend of flushing or blushing easily.  As rosacea progresses, a persistent redness in the center of the face will develop and may gradually spread beyond the nose and cheeks to the forehead and chin.  In some cases, the ears, chest, and back could be affected.  Rosacea may appear as tiny blood vessels or small red bumps that occur in crops.  Frequently they can contain pus, and are called "pustules".  If the bumps do not contain pus, they are referred to as "papules".  Rarely, in prolonged, untreated cases of rosacea, the oil glands of the nose and cheeks may become permanently enlarged.  This is called rhinophyma, and is seen more frequently in men.  Signs and Risks In its beginning stages, rosacea tends to come and go, which makes it difficult to recognize.  It can start as intermittent flushing of the face.  Eventually, blood vessels may become permanently visible.  Pustules and papules can appear, but can be mistaken for adult acne.  People of all races, ages, genders and ethnic groups are at risk of developing rosacea.  However, it is more common in women (especially around menopause) and adults with fair skin between the ages of 3 and 87.  Treatment Dermatologists typically recommend a combination of treatments to effectively manage rosacea.  Treatment can improve symptoms and may stop the progression of the rosacea.  Treatment may involve both topical and oral medications.  The tetracycline antibiotics are often used for their anti-inflammatory effect; however, because of the possibility of developing antibiotic resistance, they should not be used long term at full dose.  For dilated blood vessels the options include electrodessication (uses electric current through a small needle), laser treatment, and cosmetics to hide the redness.   With all forms of treatment, improvement is a slow process, and  patients may not see any results for the first 3-4 weeks.  It is very important to avoid the sun and other triggers.  Patients must wear sunscreen daily.  Skin Care Instructions: Cleanse the skin with a mild soap such as CeraVe cleanser, Cetaphil cleanser, or Dove soap once or twice daily as needed. Moisturize with Eucerin Redness Relief Daily Perfecting Lotion (has a subtle green tint), CeraVe Moisturizing Cream, or Oil of Olay Daily Moisturizer with sunscreen every morning and/or night as recommended. Makeup should be "non-comedogenic" (won't clog pores) and be labeled "for sensitive skin". Good choices for cosmetics are: Neutrogena, Almay, and Physician's Formula.  Any product with a green tint tends to offset a red complexion. If your eyes are dry and irritated, use artificial tears 2-3 times per day and cleanse the eyelids daily with baby shampoo.  Have your eyes examined at least every 2 years.  Be sure to tell your eye doctor that you have rosacea. Alcoholic beverages tend to cause flushing of the skin, and may make rosacea worse. Always wear sunscreen, protect your skin from extreme hot and cold temperatures, and avoid spicy foods, hot drinks, and mechanical irritation such as rubbing, scrubbing, or massaging the face.  Avoid harsh skin cleansers, cleansing masks, astringents, and exfoliation. If a particular product burns or makes your face feel tight, then it is likely to flare your rosacea. If you are having difficulty finding a sunscreen that you can tolerate, you may try switching to a chemical-free sunscreen.  These are ones whose active ingredient is zinc oxide or titanium dioxide  only.  They should also be fragrance free, non-comedogenic, and labeled for sensitive skin. Rosacea triggers may vary from person to person.  There are a variety of foods that have been reported to trigger rosacea.  Some patients find that keeping a diary of what they were doing when they flared helps them avoid  triggers.   If you have any questions or concerns for your doctor, please call our main line at [phone] and press option 4 to reach your doctor's medical assistant. If no one answers, please leave a voicemail as directed and we will return your call as soon as possible. Messages left after 4 pm will be answered the following business day.   You may also send [place] a message via [place]. We typically respond to MyChart messages within 1-2 business days.  For prescription refills, please ask your pharmacy to contact our office. Our fax number is [phone].  If you have an urgent issue when the clinic is closed that cannot wait until the next business day, you can page your doctor at the number below.    Please note that while we do our best to be available for urgent issues outside of office hours, we are not available 24/7.   If you have an urgent issue and are unable to reach [place], you may choose to seek medical care at your doctor's office, retail clinic, urgent care center, or emergency room.  If you have a medical emergency, please immediately call 911 or go to the emergency department.  Pager Numbers  - Dr. Nehemiah Massed: [phone]  - Dr. Laurence Ferrari: [phone]  - Dr. Nicole Kindred: [phone]  In the event of inclement weather, please call our main line at [phone] for an update on the status of any delays or closures.  Dermatology Medication Tips: Please keep the boxes that topical medications come in in order to help keep track of the instructions about where and how to use these. Pharmacies typically print the medication instructions only on the boxes and not directly on the medication tubes.   If your medication is too expensive, please contact our office at [phone] option 4 or send [place] a message through [place].   We are unable to tell what your co-pay for medications will be in advance as this is different depending on your insurance coverage. However, we may be able to  find a substitute medication at lower cost or fill out paperwork to get insurance to cover a needed medication.   If a prior authorization is required to get your medication covered by your insurance company, please allow [place] 1-2 business days to complete this process.  Drug prices often vary depending on where the prescription is filled and some pharmacies may offer cheaper prices.  The website www.goodrx.com contains coupons for medications through different pharmacies. The prices here do not account for what the cost may be with help from insurance (it may be cheaper with your insurance), but the website can give you the price if you did not use any insurance.  - You can print the associated coupon and take it with your prescription to the pharmacy.  - You may also stop by our office during regular business hours and pick up a GoodRx coupon card.  - If you need your prescription sent electronically to a different pharmacy, notify our office through [facility] or by phone at [phone] option 4.

## 2020-08-20 ENCOUNTER — Other Ambulatory Visit: Payer: Self-pay

## 2020-08-28 ENCOUNTER — Telehealth: Payer: 59 | Admitting: Family Medicine

## 2020-08-28 ENCOUNTER — Other Ambulatory Visit: Payer: Self-pay

## 2020-08-28 ENCOUNTER — Encounter: Payer: Self-pay | Admitting: Family Medicine

## 2020-08-28 DIAGNOSIS — R0981 Nasal congestion: Secondary | ICD-10-CM

## 2020-08-28 DIAGNOSIS — U071 COVID-19: Secondary | ICD-10-CM

## 2020-08-28 DIAGNOSIS — J028 Acute pharyngitis due to other specified organisms: Secondary | ICD-10-CM

## 2020-08-28 DIAGNOSIS — B9789 Other viral agents as the cause of diseases classified elsewhere: Secondary | ICD-10-CM

## 2020-08-28 DIAGNOSIS — R059 Cough, unspecified: Secondary | ICD-10-CM

## 2020-08-28 MED ORDER — PROMETHAZINE-DM 6.25-15 MG/5ML PO SYRP
2.5000 mL | ORAL_SOLUTION | Freq: Four times a day (QID) | ORAL | 0 refills | Status: DC | PRN
  Filled 2020-08-28: qty 118, 11d supply, fill #0

## 2020-08-28 NOTE — Patient Instructions (Addendum)
To keep from spreading the disease you should: Stay home and limit contact with other people as much as possible. Wash your hands frequently. Cover your coughs and sneezes with a tissue, and throw used tissues in the trash.   Clean and disinfect frequently touched surfaces and objects.     Take care of yourself by: Staying home Resting Drinking fluids Take fever-reducing medications (Tylenol/Acetaminophen and Ibuprofen)   For more information on the disease go to the Centers for Disease Control and Prevention website     Can take to lessen severity: Vit C 500mg  twice daily Quercertin 250-500mg  twice daily Zinc 75-100mg  daily Melatonin 3-6 mg at bedtime Vit D3 1000-2000 IU daily Aspirin 81 mg daily with food Optional: Famotidine 20mg  daily Also can add tylenol/ibuprofen as needed for fevers and body aches May add Mucinex as needed for cough/congestion     10 Things You Can Do to Manage Your COVID-19 Symptoms at Home If you have possible or confirmed COVID-19: Stay home except to get medical care. Monitor your symptoms carefully. If your symptoms get worse, call your healthcare provider immediately. Get rest and stay hydrated. If you have a medical appointment, call the healthcare provider ahead of time and tell them that you have or may have COVID-19. For medical emergencies, call 911 and notify the dispatch personnel that you have or may have COVID-19. Cover your cough and sneezes with a tissue or use the inside of your elbow. Wash your hands often with soap and water for at least 20 seconds or clean your hands with an alcohol-based hand sanitizer that contains at least 60% alcohol. As much as possible, stay in a specific room and away from other people in your home. Also, you should use a separate bathroom, if available. If you need to be around other people in or outside of the home, wear a mask. Avoid sharing personal items with other people in your household, like dishes,  towels, and bedding. Clean all surfaces that are touched often, like counters, tabletops, and doorknobs. Use household cleaning sprays or wipes according to the label instructions. June 09/06/2019 This information is not intended to replace advice given to you by your health care provider. Make sure you discuss any questions you have with your health care provider. Document Revised: 12/23/2019 Document Reviewed: 12/23/2019 Elsevier Patient Education  [place].   Promethazine; Dextromethorphan Oral Solution What is this medication? PROMETHAZINE; DEXTROMETHORPHAN (proe METH a zeen; dex troe meth OR fan) is a cough suppressant and an antihistamine. It is used to treat coughing due tocolds or allergies. This medicine will not treat an infection. This medicine may be used for other purposes; ask your health care provider orpharmacist if you have questions. COMMON BRAND NAME(S): Phen Tuss DM What should I tell my care team before I take this medication? They need to know if you have any of the following conditions: blockage in your bowel diabetes eczema glaucoma heart disease liver disease low blood counts, like low white cell, platelet, or red cell counts lung or breathing disease, like asthma Parkinson's disease prostate disease seizures stomach or intestine problems trouble passing urine an unusual or allergic reaction to promethazine, dextromethorphan, other medicines, foods, dyes, or preservatives pregnant or trying to get pregnant breast-feeding How should I use this medication? Take this medicine by mouth with a glass of water. Follow the directions on the prescription label. Use a specially marked spoon or container to measure your medicine. Household spoons are not accurate. Take your  doses at regularintervals. Do not take your medicine more often than directed. Talk to your pediatrician regarding the use of this medicine in children. Special care may be  needed. Do not use this medicine in children less than 28years of age. Patients over 39 years old may have a stronger reaction and need a smaller dose. Overdosage: If you think you have taken too much of this medicine contact apoison control center or emergency room at once. NOTE: This medicine is only for you. Do not share this medicine with others. What if I miss a dose? If you miss a dose, take it as soon as you can. If it is almost time for yournext dose, take only that dose. Do not take double or extra doses. What may interact with this medication? Do not take this medicine with any of the following medications: MAOIs like Carbex, Eldepryl, Marplan, Nardil, and Parnate This medicine may also interact with the following medications: alcohol or alcohol-containing products antihistamines for allergy, cough, and cold atropine certain medicines for anxiety or sleep certain medicines for bladder problems like oxybutynin, tolterodine certain medicines for depression like amitriptyline, fluoxetine, sertraline certain medicines for Parkinson's disease like benztropine, trihexyphenidyl certain medicines for stomach problems like dicyclomine, hyoscyamine certain medicines for travel sickness like scopolamine epinephrine general anesthetics like halothane, isoflurane, methoxyflurane, propofol ipratropium medicines for depression, anxiety or psychotic disturbances medicines for high blood pressure medicines for seizures like phenobarbital, primidone, phenytoin medicines that relax muscles for surgery metoclopramide narcotic medicines for pain This list may not describe all possible interactions. Give your health care provider a list of all the medicines, herbs, non-prescription drugs, or dietary supplements you use. Also tell them if you smoke, drink alcohol, or use illegaldrugs. Some items may interact with your medicine. What should I watch for while using this medication? Tell your doctor if  your symptoms do not improve or if they get worse. You may get drowsy or dizzy. Do not drive, use machinery, or do anything that needs mental alertness until you know how this medicine affects you. Do not stand or sit up quickly, especially if you are an older patient. This reduces the risk of dizzy or fainting spells. Alcohol may interfere with the effect ofthis medicine. Avoid alcoholic drinks. This medicine can make you more sensitive to the sun. Keep out of the sun. If you cannot avoid being in the sun, wear protective clothing and use sunscreen.Do not use sun lamps or tanning beds/booths. What side effects may I notice from receiving this medication? Side effects that you should report to your doctor or health care professionalas soon as possible: allergic reactions like skin rash, itching or hives, swelling of the face, lips, or tongue changes in vision confusion fever, chills, sore throat restlessness seizures signs and symptoms of high blood sugar such as being more thirsty or hungry or having to urinate more than normal. You may also feel very tired or have blurry vision. signs and symptoms of liver injury like dark yellow or brown urine; general ill feeling or flu-like symptoms; light-colored stools; loss of appetite; nausea; right upper belly pain; unusually weak or tired; yellowing of the eyes or skin signs and symptoms of low blood pressure like dizziness; feeling faint or lightheaded, falls; unusually weak or tired trouble passing urine or change in the amount of urine trouble swallowing uncontrollable movements of the arms, face, head, mouth, neck, or upper body unusual bruising or bleeding unusually weak or tired Side effects that usually do not require  medical attention (report to yourdoctor or health care professional if they continue or are bothersome): drowsiness dizziness dry mouth nausea upset stomach This list may not describe all possible side effects. Call your  doctor for medical advice about side effects. You may report side effects to FDA at1-800-FDA-1088. Where should I keep my medication? Keep out of the reach of children. Store at room temperature between 20 and 25 degrees C (68 and 77 degrees F).Protect from light. Throw away any unused medicine after the expiration date. NOTE: This sheet is a summary. It may not cover all possible information. If you have questions about this medicine, talk to your doctor, pharmacist, orhealth care provider.  2022 Elsevier/Gold Standard (2018-12-27 15:13:18)

## 2020-08-28 NOTE — Progress Notes (Signed)
Ms. Ashley, Contreras are scheduled for a virtual visit with your provider today.    Just as we do with appointments in the office, we must obtain your consent to participate.  Your consent will be active for this visit and any virtual visit you may have with one of our providers in the next 365 days.    If you have a MyChart account, I can also send a copy of this consent to you electronically.  All virtual visits are billed to your insurance company just like a traditional visit in the office.  As this is a virtual visit, video technology does not allow for your provider to perform a traditional examination.  This may limit your provider's ability to fully assess your condition.  If your provider identifies any concerns that need to be evaluated in person or the need to arrange testing such as labs, EKG, etc, we will make arrangements to do so.    Although advances in technology are sophisticated, we cannot ensure that it will always work on either your end or our end.  If the connection with a video visit is poor, we may have to switch to a telephone visit.  With either a video or telephone visit, we are not always able to ensure that we have a secure connection.   I need to obtain your verbal consent now.   Are you willing to proceed with your visit today?   Ashley Contreras has provided verbal consent on 08/28/2020 for a virtual visit (video or telephone).   [name], NP 08/28/2020  9:09 AM   Date:  08/28/2020   ID:  Ashley Contreras, DOB [birth year], MRN [id number]  Patient Location: Home Provider Location: Home Office   Participants: Patient and Provider for Visit and Wrap up  Method of visit: Video  Location of Patient: Home Location of Provider: Home Office Consent was obtain for visit over the video. Services rendered by provider: Visit was performed via video  A video enabled telemedicine application was used and I verified that I am speaking with the correct person using two  identifiers.  PCP:  Jerrol Banana., MD   Chief Complaint:  + covid test    History of Present Illness:    Ashley Contreras is a 60 y.o. female with history as stated below. Presents video telehealth for an acute care visit [facility]- [facility] + yesterday. Vaccinated x 2 and booster x 1  Onset of symptoms was started Wednesday morning and symptoms have been persistent and include:    hoarseness, sore throat, low grade fever-100.4. Thursday developed cough. Congestion and drainage, ear pain/stopped up.  Feels like she has an URI.   Denies having shortness of breath, chest pain, sore throat or exposure to covid or other sick contacts. 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 Modifying factors include: dayand nite quil- worked Wednesday. No other aggravating or relieving factors.  No other c/o.  The patient does have symptoms concerning for COVID-19 infection (fever, chills, cough, or new shortness of breath).   Past Medical, Surgical, Social History, Allergies, and Medications have been Reviewed.  Past Medical History:  Diagnosis Date   Abnormal Pap smear 1989   s/p colposcopy   Allergy    hay fever   Arthritis    Asthma    BRCA negative 10/2018   MyRisk neg except ATM VUS   Chicken pox    Colon polyp    Complication  of anesthesia    Family history of adverse reaction to anesthesia    sister - slow to wake   Family history of breast cancer    Fatty liver    seen by hepatology   GERD (gastroesophageal reflux disease)    Increased risk of breast cancer 10/2018   IBIS=24.7%/riskscore=15.8%   Mitral valve prolapse    PONV (postoperative nausea and vomiting)    Shingles    waist, right side   UTI (lower urinary tract infection)    Wears contact lenses     No outpatient medications have been marked as taking for the 08/28/20 encounter (Appointment) with [name], NP.     Allergies:   Penicillins and Cinnamon   ROS See HPI for  history of present illness.  Physical Exam Constitutional:      Appearance: Normal appearance.  HENT:     Head: Normocephalic.  Eyes:     Conjunctiva/sclera: Conjunctivae normal.  Pulmonary:     Effort: Pulmonary effort is normal.     Comments: No shortness of breath appreciated during visit Skin:    Coloration: Skin is not jaundiced.  Neurological:     Mental Status: She is alert.              A&P   1. COVID-19 -encouraged rest, hydration and OTC supportive measures- detailed in AVS  -she declined antiviral at this time -Patient acknowledged agreement and understanding of the plan.  -cough syrup provided see below-  Reviewed side effects, risks and benefits of medication.     - promethazine-dextromethorphan (PROMETHAZINE-DM) 6.25-15 MG/5ML syrup; Take 2.5 mLs by mouth 4 (four) times daily as needed for cough.  Dispense: 118 mL; Refill: 0  2. Cough  -cough syrup provided-  Reviewed side effects, risks and benefits of medication.     3. Nasal sinus congestion  Promethazine DM provided to help with congestion  4. Sore throat (viral)  Discussed OTC measures    Time:   Today, I have spent 22 minutes with the patient with telehealth technology discussing the above problems, reviewing the chart, previous notes, medications and orders.   Medication Changes: No orders of the defined types were placed in this encounter.    Disposition:  Follow up PRN Signed, [name], NP  08/28/2020 9:09 AM

## 2020-09-01 ENCOUNTER — Other Ambulatory Visit: Payer: Self-pay

## 2020-09-02 ENCOUNTER — Other Ambulatory Visit: Payer: Self-pay

## 2020-09-07 ENCOUNTER — Other Ambulatory Visit: Payer: Self-pay

## 2020-09-07 DIAGNOSIS — H353232 Exudative age-related macular degeneration, bilateral, with inactive choroidal neovascularization: Secondary | ICD-10-CM | POA: Diagnosis not present

## 2020-09-07 MED ORDER — NEOMYCIN-POLYMYXIN-DEXAMETH 3.5-10000-0.1 OP OINT
TOPICAL_OINTMENT | OPHTHALMIC | 1 refills | Status: DC
  Filled 2020-09-07: qty 3.5, 7d supply, fill #0

## 2020-09-21 ENCOUNTER — Other Ambulatory Visit: Payer: Self-pay

## 2020-09-21 ENCOUNTER — Ambulatory Visit
Admission: RE | Admit: 2020-09-21 | Discharge: 2020-09-21 | Disposition: A | Discharge status: Home / Self Care | Payer: 59 | Source: Ambulatory Visit | Attending: Emergency Medicine | Admitting: Emergency Medicine

## 2020-09-21 VITALS — BP 116/75 | HR 67 | Temp 97.7°F | Resp 14

## 2020-09-21 DIAGNOSIS — S20362A Insect bite (nonvenomous) of left front wall of thorax, initial encounter: Secondary | ICD-10-CM

## 2020-09-21 DIAGNOSIS — L089 Local infection of the skin and subcutaneous tissue, unspecified: Secondary | ICD-10-CM

## 2020-09-21 DIAGNOSIS — R599 Enlarged lymph nodes, unspecified: Secondary | ICD-10-CM

## 2020-09-21 DIAGNOSIS — W57XXXA Bitten or stung by nonvenomous insect and other nonvenomous arthropods, initial encounter: Secondary | ICD-10-CM

## 2020-09-21 MED ORDER — DOXYCYCLINE HYCLATE 100 MG PO CAPS: 100.0000 mg | ORAL_CAPSULE | Freq: Two times a day (BID) | ORAL | 0 refills | Status: AC

## 2020-09-21 NOTE — ED Triage Notes (Signed)
Patient c/o Insect Bite that occurred 2 days ago.   Patient endorses " I had been working in the yard when this occurred".   Patient endorses itchiness, redness, and swelling to LFT collar bone area.   Patient has taken an antihistamine and cortisone cream with no relief of symptoms.

## 2020-09-21 NOTE — ED Provider Notes (Signed)
Chief Complaint   Chief Complaint  Patient presents with   Insect Bite   APPT 1715     Subjective, HPI  Ashley Contreras is a very pleasant 60 y.o. female who presents with insect bite to left collarbone region after working in the yard 2 days ago.  Patient endorses itching, redness, swelling and firmness to the area.  Patient states that she has used antihistamines and hydrocortisone cream without improvement.  No additional symptoms such as fever, chills, vomiting or headache reported.  History obtained from patient.   Patient's problem list, past medical and social history, medications, and allergies were reviewed by me and updated in Epic.    ROS  See HPI.  Objective   Vitals:   09/21/20 1710  BP: 116/75  Pulse: 67  Resp: 14  Temp: 97.7 F (36.5 C)  SpO2: 99%    Vital signs and nursing note reviewed.  General: Appears well-developed and well-nourished. No acute distress.  HEENT: Normocephalic, atraumatic, hearing grossly intact. EOMI, no drainage. No rhinorrhea. Moist mucous membranes.  Neck: Normal range of motion, neck is supple.  Cardiovascular: Normal rate.  Pulm/Chest: No respiratory distress.   Musculoskeletal: No joint deformity, normal range of motion.  Skin: Erythematous swollen area of insect bite to inferior left clavicle with swollen lymph node to superior left clavicular region.  Both areas are erythematous and firm.  No discharge.  Data  No results found for any visits on 09/21/20.   Assessment & Plan  1. Nonvenomous insect bite of left chest wall with infection, initial encounter  2. Swelling of lymph node Meds ordered this encounter  Medications   doxycycline (VIBRAMYCIN) 100 MG capsule    Sig: Take 1 capsule (100 mg total) by mouth 2 (two) times daily for 5 days.    Dispense:  10 capsule    Refill:  0    Order Specific Question:   Supervising Provider    Answer:   Chase Picket [id number]    60 y.o. female presents with insect bite to  left collarbone region after working in the yard 2 days ago.  Patient endorses itching, redness, swelling and firmness to the area.  Patient states that she has used antihistamines and hydrocortisone cream without improvement.  No additional symptoms such as fever, chills, vomiting or headache reported.  Chart review completed.  Given symptoms along with assessment findings, likely insect bite of left chest wall to clavicular region exhibits infection with associated lymphadenopathy.  Rx'd doxycycline 100 mg twice daily for the next 5 days to the patient's preferred pharmacy.  Advised about home treatment and care along with strict return precautions for worsening of symptoms as outlined in her AVS.  Advised to continue the use of antihistamines and to follow-up with her PCP if area does not improve and to return to clinic or follow-up with her PCP sooner if area worsens.  Patient verbalized understanding and agreed the plan.  Patient stable upon discharge.  Return as needed.  Plan:   Discharge Instructions      Doxycycline may make you more sun sensitive.  Cover up and use sunscreen while taking it. Sit upright for 30 minutes after taking this medication. If you take a calcium supplement, take it between doses of the antibiotic.  Increase fluid intake. You may take Benadryl 25-'50mg'$  every 4-6 hours as needed OR Zyrtec '10mg'$  daily for itching/inflammation. You may also take over the counter Famotidine '20mg'$  once daily to help with itching/inflammation. You may apply  ice wrapped in a towel to affected area 3-5 times daily for 10-15 minute intervals. See your PCP if area does not improve in 5-6 days. See your PCP or return to clinic sooner if area worsens or you develop fever.          Ashley Contreras, [place] 09/21/20 1757

## 2020-09-21 NOTE — Discharge Instructions (Addendum)
Doxycycline may make you more sun sensitive.  Cover up and use sunscreen while taking it. Sit upright for 30 minutes after taking this medication. If you take a calcium supplement, take it between doses of the antibiotic.  Increase fluid intake. You may take Benadryl 25-'50mg'$  every 4-6 hours as needed OR Zyrtec '10mg'$  daily for itching/inflammation. You may also take over the counter Famotidine '20mg'$  once daily to help with itching/inflammation. You may apply ice wrapped in a towel to affected area 3-5 times daily for 10-15 minute intervals. See your PCP if area does not improve in 5-6 days. See your PCP or return to clinic sooner if area worsens or you develop fever.

## 2020-09-30 ENCOUNTER — Other Ambulatory Visit: Payer: Self-pay

## 2020-09-30 MED FILL — Estradiol TD Patch Twice Weekly 0.0375 MG/24HR: TRANSDERMAL | 28 days supply | Qty: 8 | Fill #1 | Status: AC

## 2020-10-01 ENCOUNTER — Other Ambulatory Visit: Payer: Self-pay

## 2020-10-01 MED FILL — Estradiol TD Patch Twice Weekly 0.0375 MG/24HR: TRANSDERMAL | 56 days supply | Qty: 16 | Fill #1 | Status: AC

## 2020-10-02 ENCOUNTER — Other Ambulatory Visit: Payer: Self-pay

## 2020-10-22 DIAGNOSIS — L719 Rosacea, unspecified: Secondary | ICD-10-CM

## 2020-10-22 HISTORY — DX: Rosacea, unspecified: L71.9

## 2020-10-30 ENCOUNTER — Other Ambulatory Visit: Payer: Self-pay

## 2020-11-03 ENCOUNTER — Ambulatory Visit (INDEPENDENT_AMBULATORY_CARE_PROVIDER_SITE_OTHER): Payer: 59 | Admitting: Dermatology

## 2020-11-03 ENCOUNTER — Other Ambulatory Visit: Payer: Self-pay

## 2020-11-03 DIAGNOSIS — L814 Other melanin hyperpigmentation: Secondary | ICD-10-CM

## 2020-11-03 DIAGNOSIS — D229 Melanocytic nevi, unspecified: Secondary | ICD-10-CM

## 2020-11-03 DIAGNOSIS — L820 Inflamed seborrheic keratosis: Secondary | ICD-10-CM

## 2020-11-03 DIAGNOSIS — D1800 Hemangioma unspecified site: Secondary | ICD-10-CM

## 2020-11-03 DIAGNOSIS — L601 Onycholysis: Secondary | ICD-10-CM | POA: Diagnosis not present

## 2020-11-03 DIAGNOSIS — L308 Other specified dermatitis: Secondary | ICD-10-CM

## 2020-11-03 DIAGNOSIS — Z1283 Encounter for screening for malignant neoplasm of skin: Secondary | ICD-10-CM | POA: Diagnosis not present

## 2020-11-03 DIAGNOSIS — L0100 Impetigo, unspecified: Secondary | ICD-10-CM | POA: Diagnosis not present

## 2020-11-03 DIAGNOSIS — Z872 Personal history of diseases of the skin and subcutaneous tissue: Secondary | ICD-10-CM

## 2020-11-03 DIAGNOSIS — B351 Tinea unguium: Secondary | ICD-10-CM | POA: Diagnosis not present

## 2020-11-03 DIAGNOSIS — L719 Rosacea, unspecified: Secondary | ICD-10-CM | POA: Diagnosis not present

## 2020-11-03 DIAGNOSIS — L578 Other skin changes due to chronic exposure to nonionizing radiation: Secondary | ICD-10-CM

## 2020-11-03 DIAGNOSIS — L309 Dermatitis, unspecified: Secondary | ICD-10-CM

## 2020-11-03 DIAGNOSIS — L821 Other seborrheic keratosis: Secondary | ICD-10-CM | POA: Diagnosis not present

## 2020-11-03 MED ORDER — MUPIROCIN 2 % EX OINT
1.0000 "application " | TOPICAL_OINTMENT | Freq: Two times a day (BID) | CUTANEOUS | 1 refills | Status: DC
  Filled 2020-11-03: qty 22, 7d supply, fill #0

## 2020-11-03 MED ORDER — DOXYCYCLINE HYCLATE 20 MG PO TABS
40.0000 mg | ORAL_TABLET | Freq: Every day | ORAL | 4 refills | Status: AC
  Filled 2020-11-03: qty 60, 30d supply, fill #0
  Filled 2021-02-10: qty 60, 30d supply, fill #1

## 2020-11-03 NOTE — Patient Instructions (Addendum)
Rosacea  What is rosacea? Rosacea (say: ro-zay-sha) is a common skin disease that usually begins as a trend of flushing or blushing easily.  As rosacea progresses, a persistent redness in the center of the face will develop and may gradually spread beyond the nose and cheeks to the forehead and chin.  In some cases, the ears, chest, and back could be affected.  Rosacea may appear as tiny blood vessels or small red bumps that occur in crops.  Frequently they can contain pus, and are called "pustules".  If the bumps do not contain pus, they are referred to as "papules".  Rarely, in prolonged, untreated cases of rosacea, the oil glands of the nose and cheeks may become permanently enlarged.  This is called rhinophyma, and is seen more frequently in men.  Signs and Risks In its beginning stages, rosacea tends to come and go, which makes it difficult to recognize.  It can start as intermittent flushing of the face.  Eventually, blood vessels may become permanently visible.  Pustules and papules can appear, but can be mistaken for adult acne.  People of all races, ages, genders and ethnic groups are at risk of developing rosacea.  However, it is more common in women (especially around menopause) and adults with fair skin between the ages of 3 and 87.  Treatment Dermatologists typically recommend a combination of treatments to effectively manage rosacea.  Treatment can improve symptoms and may stop the progression of the rosacea.  Treatment may involve both topical and oral medications.  The tetracycline antibiotics are often used for their anti-inflammatory effect; however, because of the possibility of developing antibiotic resistance, they should not be used long term at full dose.  For dilated blood vessels the options include electrodessication (uses electric current through a small needle), laser treatment, and cosmetics to hide the redness.   With all forms of treatment, improvement is a slow process, and  patients may not see any results for the first 3-4 weeks.  It is very important to avoid the sun and other triggers.  Patients must wear sunscreen daily.  Skin Care Instructions: Cleanse the skin with a mild soap such as CeraVe cleanser, Cetaphil cleanser, or Dove soap once or twice daily as needed. Moisturize with Eucerin Redness Relief Daily Perfecting Lotion (has a subtle green tint), CeraVe Moisturizing Cream, or Oil of Olay Daily Moisturizer with sunscreen every morning and/or night as recommended. Makeup should be "non-comedogenic" (won't clog pores) and be labeled "for sensitive skin". Good choices for cosmetics are: Neutrogena, Almay, and Physician's Formula.  Any product with a green tint tends to offset a red complexion. If your eyes are dry and irritated, use artificial tears 2-3 times per day and cleanse the eyelids daily with baby shampoo.  Have your eyes examined at least every 2 years.  Be sure to tell your eye doctor that you have rosacea. Alcoholic beverages tend to cause flushing of the skin, and may make rosacea worse. Always wear sunscreen, protect your skin from extreme hot and cold temperatures, and avoid spicy foods, hot drinks, and mechanical irritation such as rubbing, scrubbing, or massaging the face.  Avoid harsh skin cleansers, cleansing masks, astringents, and exfoliation. If a particular product burns or makes your face feel tight, then it is likely to flare your rosacea. If you are having difficulty finding a sunscreen that you can tolerate, you may try switching to a chemical-free sunscreen.  These are ones whose active ingredient is zinc oxide or titanium dioxide  only.  They should also be fragrance free, non-comedogenic, and labeled for sensitive skin. Rosacea triggers may vary from person to person.  There are a variety of foods that have been reported to trigger rosacea.  Some patients find that keeping a diary of what they were doing when they flared helps them avoid  triggers.  Doxycycline should be taken with food to prevent nausea. Do not lay down for 30 minutes after taking. Be cautious with sun exposure and use good sun protection while on this medication. Pregnant women should not take this medication.   Recommend daily broad spectrum sunscreen SPF 30+ to sun-exposed areas, reapply every 2 hours as needed. Call for new or changing lesions.  Staying in the shade or wearing long sleeves, sun glasses (UVA+UVB protection) and wide brim hats (4-inch brim around the entire circumference of the hat) are also recommended for sun protection.   Seborrheic Keratosis  What causes seborrheic keratoses? Seborrheic keratoses are harmless, common skin growths that first appear during adult life.  As time goes by, more growths appear.  Some people may develop a large number of them.  Seborrheic keratoses appear on both covered and uncovered body parts.  They are not caused by sunlight.  The tendency to develop seborrheic keratoses can be inherited.  They vary in color from skin-colored to gray, brown, or even black.  They can be either smooth or have a rough, warty surface.   Seborrheic keratoses are superficial and look as if they were stuck on the skin.  Under the microscope this type of keratosis looks like layers upon layers of skin.  That is why at times the top layer may seem to fall off, but the rest of the growth remains and re-grows.    Treatment Seborrheic keratoses do not need to be treated, but can easily be removed in the office.  Seborrheic keratoses often cause symptoms when they rub on clothing or jewelry.  Lesions can be in the way of shaving.  If they become inflamed, they can cause itching, soreness, or burning.  Removal of a seborrheic keratosis can be accomplished by freezing, burning, or surgery. If any spot bleeds, scabs, or grows rapidly, please return to have it checked, as these can be an indication of a skin cancer.  Cryotherapy Aftercare  Wash  gently with soap and water everyday.   Apply Vaseline and Band-Aid daily until healed.    Melanoma ABCDEs  Melanoma is the most dangerous type of skin cancer, and is the leading cause of death from skin disease.  You are more likely to develop melanoma if you: Have light-colored skin, light-colored eyes, or red or blond hair Spend a lot of time in the sun Tan regularly, either outdoors or in a tanning bed Have had blistering sunburns, especially during childhood Have a close family member who has had a melanoma Have atypical moles or large birthmarks  Early detection of melanoma is key since treatment is typically straightforward and cure rates are extremely high if we catch it early.   The first sign of melanoma is often a change in a mole or a new dark spot.  The ABCDE system is a way of remembering the signs of melanoma.  A for asymmetry:  The two halves do not match. B for border:  The edges of the growth are irregular. C for color:  A mixture of colors are present instead of an even brown color. D for diameter:  Melanomas are usually (but not  always) greater than 87m - the size of a pencil eraser. E for evolution:  The spot keeps changing in size, shape, and color.  Please check your skin once per month between visits. You can use a small mirror in front and a large mirror behind you to keep an eye on the back side or your body.   If you see any new or changing lesions before your next follow-up, please call to schedule a visit.  Please continue daily skin protection including broad spectrum sunscreen SPF 30+ to sun-exposed areas, reapplying every 2 hours as needed when you're outdoors.   Staying in the shade or wearing long sleeves, sun glasses (UVA+UVB protection) and wide brim hats (4-inch brim around the entire circumference of the hat) are also recommended for sun protection.    If you have any questions or concerns for your doctor, please call our main line at [phone] and press option 4 to reach your doctor's medical assistant. If no one answers, please leave a voicemail as directed and we will return your call as soon as possible. Messages left after 4 pm will be answered the following business day.   You may also send [place] message via [place] We typically respond to MyChart messages within 1-2 business days.  For prescription refills, please ask your pharmacy to contact our office. Our fax number is [phone]  If you have an urgent issue when the clinic is closed that cannot wait until the next business day, you can page your doctor at the number below.    Please note that while we do our best to be available for urgent issues outside of office hours, we are not available 24/7.   If you have an urgent issue and are unable to reach [place] you may choose to seek medical care at your doctor's office, retail clinic, urgent care center, or emergency room.  If you have a medical emergency, please immediately call 911 or go to the emergency department.  Pager Numbers  - Dr. KNehemiah Massed [phone] - Dr. MLaurence Ferrari [phone] - Dr. SNicole Kindred [phone] In the event of inclement weather, please call our main line at [phone] an update on the status of any delays or closures.  Dermatology Medication Tips: Please keep the boxes that topical medications come in in order to help keep track of the instructions about where and how to use these. Pharmacies typically print the medication instructions only on the boxes and not directly on the medication tubes.   If your medication is too expensive, please contact our office at [phone] 4 or send [place] message through [place]   We are unable to tell what your co-pay for medications will be in advance as this is different depending on your insurance coverage. However, we may be able to find a substitute medication at lower cost or fill out paperwork to get insurance to cover a needed medication.    If a prior authorization is required to get your medication covered by your insurance company, please allow [place]-2 business days to complete this process.  Drug prices often vary depending on where the prescription is filled and some pharmacies may offer cheaper prices.  The website www.goodrx.com contains coupons for medications through different pharmacies. The prices here do not account for what the cost may be with help from insurance (it may be cheaper with your insurance), but the website can give you the price if you did not use any insurance.  -  You can print the associated coupon and take it with your prescription to the pharmacy.  - You may also stop by our office during regular business hours and pick up a GoodRx coupon card.  - If you need your prescription sent electronically to a different pharmacy, notify our office through [facility] or by phone at [phone] option 4.

## 2020-11-03 NOTE — Progress Notes (Signed)
Follow-Up Visit   Subjective  Ashley Contreras is a 60 y.o. female who presents for the following: Annual Exam (Patient here today for tbse. She reports some scaly spots at tip of nose and scab inside of nose. She has been using otc antiobiotic to treat areas. ).  She is frequently red on nose and cheeks, and gets dry irritated eyes.  Scab inside nose has cleared up, but it tends to recur.  She uses a night cream with retinol.  She also has an itchy growth on her left side.  Patient here for full body skin exam and skin cancer screening.  The following portions of the chart were reviewed this encounter and updated as appropriate:      Objective  Well appearing patient in no apparent distress; mood and affect are within normal limits.  A full examination was performed including scalp, head, eyes, ears, nose, lips, neck, chest, axillae, abdomen, back, buttocks, bilateral upper extremities, bilateral lower extremities, hands, feet, fingers, toes, fingernails, and toenails. All findings within normal limits unless otherwise noted below.  Nose Erythema on right nasal tip and left nasal tip.  No scale.  She reports having dry irritated eyes  bilateral nares Clear today, h/o of crusted sore inside nostrils  left flank at bra line x 1 Erythematous keratotic or waxy stuck-on papule  right upper arm 7 x 5 mm waxy tan macule speckled darker inferior       Left great toenail  Distal mild onycholysis at left great toenail. Much improved when compared to baseline photo  left upper and lower eyelid pink scaly patches L upper and lower eyelid  Assessment & Plan  Rosacea Nose  Rosacea is a chronic progressive skin condition usually affecting the face of adults, causing redness and/or acne bumps. It is treatable but not curable. It sometimes affects the eyes (ocular rosacea) as well. It may respond to topical and/or systemic medication and can flare with stress, sun exposure, alcohol,  exercise and some foods.  Daily application of broad spectrum spf 30+ sunscreen to face is recommended to reduce flares.  Continue metronidazole 0.75 % cream apply 1 - 2 times a day to nose/cheeks   Start doxycycline 20 mg tablet - take 2 tabs by mouth daily with food   Doxycycline should be taken with food to prevent nausea. Do not lay down for 30 minutes after taking. Be cautious with sun exposure and use good sun protection while on this medication. Pregnant women should not take this medication.      doxycycline (PERIOSTAT) 20 MG tablet - Nose Take 2 tablets (40 mg total) by mouth daily. Take with food  Related Medications metroNIDAZOLE (METROCREAM) 0.75 % cream Apply to nose 1-2 times a day for rosacea.  Impetigo bilateral nares  Clear today Patient has no active bumps today inside nose  Start mupirocin ointment 2 % use twice a day inside nose for 1 week to decolonize staph and prn flares  mupirocin ointment (BACTROBAN) 2 % - bilateral nares Apply 1 application topically 2 (two) times daily. Apply inside both nares for 1 week  Inflamed seborrheic keratosis left flank at bra line x 1  Destruction of lesion - left flank at bra line x 1  Destruction method: cryotherapy   Informed consent: discussed and consent obtained   Lesion destroyed using liquid nitrogen: Yes   Region frozen until ice ball extended beyond lesion: Yes   Outcome: patient tolerated procedure well with no complications  Post-procedure details: wound care instructions given   Additional details:  Prior to procedure, discussed risks of blister formation, small wound, skin dyspigmentation, or rare scar following cryotherapy. Recommend Vaseline ointment to treated areas while healing.   Seborrheic keratosis right upper arm  Benign-appearing.  Observation.  Call clinic for new or changing lesions.  Recommend daily use of broad spectrum spf 30+ sunscreen to sun-exposed areas.    Will recheck at next  follow up  Tinea unguium Left great toenail  With onycholysis- Improving  Continue Tavaborole 5 % soln qhs, pt has    Related Medications Tavaborole (KERYDIN) 5 % SOLN Apply to affected toenail nightly until improved.  Periocular dermatitis left upper and lower eyelid  Sample of Eucrisa to use around eye area twice a day until clear and then stop  Stop use of topical retinol on face until rash clear, then may restart using less frequently  Apply Vanicream ointment at eyelids prn dryness.   Eucrisa samples given   Lentigines - Scattered tan macules - Due to sun exposure - Benign-appering, observe - Recommend daily broad spectrum sunscreen SPF 30+ to sun-exposed areas, reapply every 2 hours as needed. - Call for any changes  Seborrheic Keratoses - Stuck-on, waxy, tan-brown papules and/or plaques  - Benign-appearing - Discussed benign etiology and prognosis. - Observe - Call for any changes  Melanocytic Nevi - Tan-brown and/or pink-flesh-colored symmetric macules and papules - Benign appearing on exam today - Observation - Call clinic for new or changing moles - Recommend daily use of broad spectrum spf 30+ sunscreen to sun-exposed areas.   Hemangiomas - Red papules - Discussed benign nature - Observe - Call for any changes  Actinic Damage - Chronic condition, secondary to cumulative UV/sun exposure - diffuse scaly erythematous macules with underlying dyspigmentation - Recommend daily broad spectrum sunscreen SPF 30+ to sun-exposed areas, reapply every 2 hours as needed.  - Staying in the shade or wearing long sleeves, sun glasses (UVA+UVB protection) and wide brim hats (4-inch brim around the entire circumference of the hat) are also recommended for sun protection.  - Call for new or changing lesions.  History of PreCancerous Actinic Keratosis  - site(s) of PreCancerous Actinic Keratosis clear today at right dorsum of nose  - these may recur and new lesions  may form requiring treatment to prevent transformation into skin cancer - observe for new or changing spots and contact [facility] for appointment if occur - photoprotection with sun protective clothing; sunglasses and broad spectrum sunscreen with SPF of at least 30 + and frequent self skin exams recommended - yearly exams by a dermatologist recommended for persons with history of PreCancerous Actinic Keratoses  Skin cancer screening performed today.  Return for 4 month rosacea follow , 1 year tbse .  I, Ruthell Rummage, CMA, am acting as scribe for Brendolyn Patty, MD.  Documentation: I have reviewed the above documentation for accuracy and completeness, and I agree with the above.  Brendolyn Patty MD

## 2020-11-09 ENCOUNTER — Other Ambulatory Visit: Payer: Self-pay

## 2020-11-09 ENCOUNTER — Ambulatory Visit (INDEPENDENT_AMBULATORY_CARE_PROVIDER_SITE_OTHER): Payer: 59 | Admitting: Family Medicine

## 2020-11-09 ENCOUNTER — Encounter: Payer: Self-pay | Admitting: Family Medicine

## 2020-11-09 VITALS — BP 98/58 | HR 59 | Temp 98.6°F | Resp 16 | Ht 66.0 in | Wt 132.0 lb

## 2020-11-09 DIAGNOSIS — E559 Vitamin D deficiency, unspecified: Secondary | ICD-10-CM | POA: Diagnosis not present

## 2020-11-09 DIAGNOSIS — K851 Biliary acute pancreatitis without necrosis or infection: Secondary | ICD-10-CM | POA: Diagnosis not present

## 2020-11-09 DIAGNOSIS — E0590 Thyrotoxicosis, unspecified without thyrotoxic crisis or storm: Secondary | ICD-10-CM

## 2020-11-09 DIAGNOSIS — K760 Fatty (change of) liver, not elsewhere classified: Secondary | ICD-10-CM | POA: Diagnosis not present

## 2020-11-09 DIAGNOSIS — E538 Deficiency of other specified B group vitamins: Secondary | ICD-10-CM | POA: Diagnosis not present

## 2020-11-09 DIAGNOSIS — Z85820 Personal history of malignant melanoma of skin: Secondary | ICD-10-CM | POA: Diagnosis not present

## 2020-11-09 DIAGNOSIS — Z803 Family history of malignant neoplasm of breast: Secondary | ICD-10-CM | POA: Diagnosis not present

## 2020-11-09 NOTE — Progress Notes (Signed)
Established patient visit   Patient: Ashley Contreras   DOB: Nov 07, 1960   60 y.o. Female  MRN: [id number] Visit Date: 11/09/2020  Today's healthcare provider: Megan Mans, MD   Chief Complaint  Patient presents with   Follow-up   Subjective    HPI   Patient presents today for a follow up. She feels well today with no other complaints.  She is taking Arads for macular degeneration.  This occasionally upsets her stomach. She is feeling well.  BP Readings from Last 3 Encounters:  11/09/20 (!) 98/58  09/21/20 116/75  07/09/20 115/73   Wt Readings from Last 3 Encounters:  11/09/20 132 lb (59.9 kg)  07/09/20 132 lb (59.9 kg)  06/18/20 134 lb (60.8 kg)      Medications: Outpatient Medications Prior to Visit  Medication Sig   acetaminophen (TYLENOL) 500 MG tablet Take 500 mg by mouth every 4 (four) hours as needed.   calcium-vitamin D (OSCAL WITH D) 500-200 MG-UNIT TABS tablet Take by mouth.   Cholecalciferol 50 MCG (2000 UT) TABS Take by mouth.   doxycycline (PERIOSTAT) 20 MG tablet Take 2 tablets (40 mg total) by mouth daily. Take with food   estradiol (VIVELLE-DOT) 0.0375 MG/24HR PLACE 1 PATCH ONTO THE SKIN 2 TIMES A WEEK.   fluticasone (FLONASE) 50 MCG/ACT nasal spray Place 2 sprays into both nostrils daily.   ibuprofen (ADVIL) 800 MG tablet TAKE 1 TABLET (800 MG TOTAL) BY MOUTH EVERY 8 (EIGHT) HOURS AS NEEDED.   Lifitegrast (XIIDRA) 5 % SOLN PLACE 1 DROP INTO BOTH EYES TWICE A DAY   loratadine (CLARITIN) 10 MG tablet Take 10 mg by mouth daily.   metroNIDAZOLE (METROCREAM) 0.75 % cream Apply to nose 1-2 times a day for rosacea.   Multiple Vitamins-Minerals (PRESERVISION AREDS 2 PO) Take by mouth 2 (two) times daily.   mupirocin ointment (BACTROBAN) 2 % Apply 1 application topically 2 (two) times daily. Apply inside both nares for 1 week   neomycin-polymyxin b-dexamethasone (MAXITROL) 3.5-10000-0.1 OINT Apply 1/4 inch on eyelid once a day   pantoprazole  (PROTONIX) 40 MG tablet Take 1 tablet (40 mg total) by mouth 2 (two) times daily.   polyethylene glycol (MIRALAX / GLYCOLAX) 17 g packet Take 17 g by mouth daily as needed for moderate constipation.   Tavaborole (KERYDIN) 5 % SOLN Apply to affected toenail nightly until improved.   valACYclovir (VALTREX) 1000 MG tablet TAKE 2 TABLETS AT THE ONSET OF A FEVER BLISTER AND THEN TAKE 2 TABLETS 12 HOURS LATER. USE AS NEEDED   promethazine-dextromethorphan (PROMETHAZINE-DM) 6.25-15 MG/5ML syrup Take 2.5 mLs by mouth 4 (four) times daily as needed for cough. (Patient not taking: Reported on 11/09/2020)   No facility-administered medications prior to visit.    Review of Systems  Constitutional:  Negative for activity change and fatigue.  Respiratory:  Negative for cough and shortness of breath.   Cardiovascular:  Negative for chest pain, palpitations and leg swelling.  Musculoskeletal:  Negative for arthralgias and myalgias.  Neurological:  Negative for dizziness, light-headedness and headaches.  Psychiatric/Behavioral:  Negative for agitation. The patient is not nervous/anxious.       Objective    BP (!) 98/58   Pulse (!) 59   Temp 98.6 F (37 C)   Resp 16   Ht 5\' 6"  (1.676 m)   Wt 132 lb (59.9 kg)   BMI 21.31 kg/m  BP Readings from Last 3 Encounters:  11/09/20 (!) 98/58  09/21/20  116/75  07/09/20 115/73   Wt Readings from Last 3 Encounters:  11/09/20 132 lb (59.9 kg)  07/09/20 132 lb (59.9 kg)  06/18/20 134 lb (60.8 kg)      Physical Exam Vitals reviewed.  Constitutional:      General: She is not in acute distress.    Appearance: She is well-developed.  HENT:     Head: Normocephalic and atraumatic.     Right Ear: Hearing normal.     Left Ear: Hearing normal.     Nose: Nose normal.  Eyes:     General: Lids are normal. No scleral icterus.       Right eye: No discharge.        Left eye: No discharge.     Conjunctiva/sclera: Conjunctivae normal.  Cardiovascular:     Rate  and Rhythm: Normal rate and regular rhythm.     Heart sounds: Normal heart sounds.  Pulmonary:     Effort: Pulmonary effort is normal. No respiratory distress.  Skin:    Findings: No lesion or rash.  Neurological:     General: No focal deficit present.     Mental Status: She is alert and oriented to person, place, and time.  Psychiatric:        Mood and Affect: Mood normal.        Speech: Speech normal.        Behavior: Behavior normal.        Thought Content: Thought content normal.        Judgment: Judgment normal.      No results found for any visits on 11/09/20.  Assessment & Plan     1. Fatty liver Follow-up liver functions and continue to work on diet and exercise  2. Acute gallstone pancreatitis Patient is resolved after cholecystectomy  3. Subclinical hyperthyroidism Follow TSH  4. Family history of breast cancer   5. H/O malignant melanoma of skin   6. Vitamin D deficiency On vitamin D  7. B12 deficiency Start B12 and folate daily   No follow-ups on file.      I, Megan Mans, MD, have reviewed all documentation for this visit. The documentation on 11/12/20 for the exam, diagnosis, procedures, and orders are all accurate and complete.    Gavin Telford Wendelyn Breslow, MD  [facility] [phone] (phone) [phone] (fax)  [facility]

## 2020-11-09 NOTE — Patient Instructions (Signed)
Start over the counter Vit 123456 and folic acid once daily.

## 2020-11-12 ENCOUNTER — Encounter: Payer: Self-pay | Admitting: Family Medicine

## 2020-11-12 NOTE — Telephone Encounter (Signed)
I am okay with referral to whoever she wants to see.  I do not know anything about that Dr. Hassell Done

## 2020-11-17 ENCOUNTER — Other Ambulatory Visit: Payer: Self-pay | Admitting: *Deleted

## 2020-11-17 DIAGNOSIS — L659 Nonscarring hair loss, unspecified: Secondary | ICD-10-CM

## 2020-11-19 ENCOUNTER — Telehealth: Payer: Self-pay

## 2020-11-19 NOTE — Telephone Encounter (Signed)
Copied from [place] [id number]. Topic: Referral - Status >> Nov 19, 2020  2:32 PM Alanda Slim E wrote: Reason for CRM: Sharee Pimple from [facility] dermatology called and stated that the Patients [facility] insurance will not cover hair loss or alepecia / so pt will have to pay out of pocket / Please advise

## 2020-11-23 NOTE — Telephone Encounter (Signed)
Patient is aware 

## 2020-11-27 ENCOUNTER — Other Ambulatory Visit: Payer: Self-pay

## 2020-11-27 MED ORDER — FLUARIX QUADRIVALENT 0.5 ML IM SUSY
PREFILLED_SYRINGE | INTRAMUSCULAR | 0 refills | Status: DC
  Filled 2020-11-27: qty 0.5, 1d supply, fill #0

## 2020-12-17 NOTE — Progress Notes (Signed)
PCP: Jerrol Banana., MD   Chief Complaint  Patient presents with   Gynecologic Exam   Vaginal Discharge    Itching and irritation, no odor since Saturday    HPI:      [name] is a 60 y.o. No obstetric history on file. who LMP was No LMP recorded. Patient has had a hysterectomy., presents today for her annual examination.  Her menses are absent due to supracervical hyst BSO for menorrhagia/endometriosis 2008. No PMB. She rarely has vasomotor sx on vivelle-dot 0.0375 mg. Doing well and wants to continue.   Sex activity: single partner, contraception - status post hysterectomy. She does not have vaginal dryness.  Last Pap: 11/06/18  Results were: no abnormalities /neg HPV DNA. Still has cx. Hx of STDs: none  Has had issues with vaginal itching/burning for 3 days, no prior abx use. Was treated for BV with metrogel 2/22 with sx relief. No fishy odor today.  Last mammogram: 06/22/20  Results were: normal--routine follow-up in 12 months There is a FH of breast cancer in her mom and 2 mat aunts. Pt is MyRisk neg except ATM VUS 9/20. IBIS=24.7%/riskscore=15.8%. There is no FH of ovarian cancer. The patient does do self-breast exams. Has not had screening breast MRI yet but interested this year.  Colonoscopy: 12/18 without abnormalities;  Repeat due after 5 years due to hx of polyps in past. Pt has a hx of ext hemorrhoid, sometimes with flare. Doesn't need RF of analpram, currently. Prefers supp to cream but not on formulary. DEXA: normal D[facility]A in spine and hip at [facility] 2016.   Tobacco use: The patient denies current or previous tobacco use. Alcohol use: none  No drug use Exercise: moderately active  She does get adequate calcium and Vitamin D in her diet. Hx of Vit D deficiency.  Diagnosed with macular degeneration and rosacea   Past Medical History:  Diagnosis Date   Abnormal Pap smear 1989   s/p colposcopy   Allergy    hay fever   Arthritis    Asthma    BRCA  negative 10/2018   MyRisk neg except ATM VUS   Chicken pox    Colon polyp    Complication of anesthesia    Family history of adverse reaction to anesthesia    sister - slow to wake   Family history of breast cancer    Fatty liver    seen by hepatology   GERD (gastroesophageal reflux disease)    Increased risk of breast cancer 10/2018   IBIS=24.7%/riskscore=15.8%   Mitral valve prolapse    PONV (postoperative nausea and vomiting)    Shingles    waist, right side   UTI (lower urinary tract infection)    Wears contact lenses     Past Surgical History:  Procedure Laterality Date   ABDOMINAL HYSTERECTOMY  2008   BREAST BIOPSY Left    benign   calcaneous osteotomy  06/2011   COLONOSCOPY WITH PROPOFOL N/A 02/17/2017   Procedure: COLONOSCOPY WITH PROPOFOL;  Surgeon: Lucilla Lame, MD;  Location: [place];  Service: Endoscopy;  Laterality: N/A;   ENDOSCOPIC RETROGRADE CHOLANGIOPANCREATOGRAPHY (ERCP) WITH PROPOFOL N/A 05/17/2020   Procedure: ENDOSCOPIC RETROGRADE CHOLANGIOPANCREATOGRAPHY (ERCP) WITH PROPOFOL;  Surgeon: Lucilla Lame, MD;  Location: [facility] ENDOSCOPY;  Service: Endoscopy;  Laterality: N/A;   ERCP N/A 06/18/2020   Procedure: ENDOSCOPIC RETROGRADE CHOLANGIOPANCREATOGRAPHY (ERCP);  Surgeon: Lucilla Lame, MD;  Location: [facility] ENDOSCOPY;  Service: Endoscopy;  Laterality: N/A;   ESOPHAGOGASTRODUODENOSCOPY (  EGD) WITH PROPOFOL N/A 02/17/2017   Procedure: ESOPHAGOGASTRODUODENOSCOPY (EGD) WITH PROPOFOL;  Surgeon: Lucilla Lame, MD;  Location: [place];  Service: Endoscopy;  Laterality: N/A;   foot and ankle surgery     OOPHORECTOMY     TONSILLECTOMY AND ADENOIDECTOMY  2008    Family History  Problem Relation Age of Onset   Arthritis Mother    Hypertension Mother    Mental illness Mother    Diabetes Mother    Breast cancer Mother 94       right breast   Hyperlipidemia Father    Heart disease Father    Cancer Father        lung   Mental illness Sister     Myasthenia gravis Sister    Multiple sclerosis Sister    Multiple sclerosis Other    Stroke Maternal Aunt    Breast cancer Maternal Aunt 57       right breast   Stroke Maternal Grandmother    Hypertension Maternal Grandmother    Diabetes Maternal Grandmother    Breast cancer Maternal Aunt 72       right breast    Social History   Socioeconomic History   Marital status: Married    Spouse name: Not on file   Number of children: 2   Years of education: Not on file   Highest education level: Not on file  Occupational History   Occupation: Transport planner    Employer: [employer]  Tobacco Use   Smoking status: Never   Smokeless tobacco: Never  Vaping Use   Vaping Use: Never used  Substance and Sexual Activity   Alcohol use: No   Drug use: No   Sexual activity: Yes    Birth control/protection: Surgical    Comment: Hysterectomy  Other Topics Concern   Not on file  Social History Narrative   Lives in [place], 2 children. No pets.      Work - [employer]      Diet - regular, healthy   Exercise -bike sometimes, limited by injury   Social Determinants of Radio broadcast assistant Strain: Not on file  Food Insecurity: Not on file  Transportation Needs: Not on file  Physical Activity: Not on file  Stress: Not on file  Social Connections: Not on file  Intimate Partner Violence: Not on file     Current Outpatient Medications:    acetaminophen (TYLENOL) 500 MG tablet, Take 500 mg by mouth every 4 (four) hours as needed., Disp: , Rfl:    calcium-vitamin D (OSCAL WITH D) 500-200 MG-UNIT TABS tablet, Take by mouth., Disp: , Rfl:    fluconazole (DIFLUCAN) 150 MG tablet, Take 1 tablet (150 mg total) by mouth once for 1 dose., Disp: 1 tablet, Rfl: 0   fluticasone (FLONASE) 50 MCG/ACT nasal spray, Place 2 sprays into both nostrils daily., Disp: 16 g, Rfl: 2   ibuprofen (ADVIL) 800 MG tablet, TAKE 1 TABLET (800 MG TOTAL) BY MOUTH EVERY 8 (EIGHT) HOURS AS NEEDED., Disp: 30  tablet, Rfl: 0   Lifitegrast (XIIDRA) 5 % SOLN, PLACE 1 DROP INTO BOTH EYES TWICE A DAY, Disp: 60 each, Rfl: 6   loratadine (CLARITIN) 10 MG tablet, Take 10 mg by mouth daily., Disp: , Rfl:    metroNIDAZOLE (METROCREAM) 0.75 % cream, Apply to nose 1-2 times a day for rosacea., Disp: 45 g, Rfl: 1   Multiple Vitamins-Minerals (PRESERVISION AREDS 2 PO), Take by mouth 2 (two) times daily., Disp: ,  Rfl:    mupirocin ointment (BACTROBAN) 2 %, Apply 1 application topically 2 (two) times daily. Apply inside both nares for 1 week, Disp: 22 g, Rfl: 1   neomycin-polymyxin b-dexamethasone (MAXITROL) 3.5-10000-0.1 OINT, Apply 1/4 inch on eyelid once a day, Disp: 3.5 g, Rfl: 1   pantoprazole (PROTONIX) 40 MG tablet, Take 1 tablet (40 mg total) by mouth 2 (two) times daily., Disp: 180 tablet, Rfl: 1   Tavaborole (KERYDIN) 5 % SOLN, Apply to affected toenail nightly until improved., Disp: 10 mL, Rfl: 2   valACYclovir (VALTREX) 1000 MG tablet, TAKE 2 TABLETS AT THE ONSET OF A FEVER BLISTER AND THEN TAKE 2 TABLETS 12 HOURS LATER. USE AS NEEDED, Disp: 30 tablet, Rfl: 1   estradiol (VIVELLE-DOT) 0.0375 MG/24HR, PLACE 1 PATCH ONTO THE SKIN 2 TIMES A WEEK., Disp: 24 patch, Rfl: 3     ROS:  Review of Systems  Constitutional:  Negative for fatigue, fever and unexpected weight change.  Respiratory:  Negative for cough, shortness of breath and wheezing.   Cardiovascular:  Negative for chest pain, palpitations and leg swelling.  Gastrointestinal:  Negative for blood in stool, constipation, diarrhea, nausea and vomiting.  Endocrine: Negative for cold intolerance, heat intolerance and polyuria.  Genitourinary:  Negative for dyspareunia, dysuria, flank pain, frequency, genital sores, hematuria, menstrual problem, pelvic pain, urgency, vaginal bleeding, vaginal discharge and vaginal pain.  Musculoskeletal:  Negative for back pain, joint swelling and myalgias.  Skin:  Negative for rash.  Neurological:  Negative for  dizziness, syncope, light-headedness, numbness and headaches.  Hematological:  Negative for adenopathy.  Psychiatric/Behavioral:  Negative for agitation, confusion, sleep disturbance and suicidal ideas. The patient is not nervous/anxious.  BREAST: No symptoms    Objective: BP 100/60   Ht 5' 6" (1.676 m)   Wt 131 lb (59.4 kg)   BMI 21.14 kg/m    Physical Exam Constitutional:      Appearance: She is well-developed.  Genitourinary:     Vulva normal.     Genitourinary Comments: UTERUS SURG REM     Right Labia: No rash, tenderness or lesions.    Left Labia: No tenderness, lesions or rash.    No vaginal discharge, erythema or tenderness.      Right Adnexa: absent.    Right Adnexa: not tender and no mass present.    Left Adnexa: absent.    Left Adnexa: not tender and no mass present.    No cervical friability or polyp.     Uterus is not enlarged or tender.     Uterus is absent.  Rectum:     External hemorrhoid present.  Breasts:    Right: No mass, nipple discharge, skin change or tenderness.     Left: No mass, nipple discharge, skin change or tenderness.  Neck:     Thyroid: No thyromegaly.  Cardiovascular:     Rate and Rhythm: Normal rate and regular rhythm.     Heart sounds: Normal heart sounds. No murmur heard. Pulmonary:     Effort: Pulmonary effort is normal.     Breath sounds: Normal breath sounds.  Abdominal:     Palpations: Abdomen is soft.     Tenderness: There is no abdominal tenderness. There is no guarding or rebound.  Musculoskeletal:        General: Normal range of motion.     Cervical back: Normal range of motion.  Lymphadenopathy:     Cervical: No cervical adenopathy.  Neurological:     General: No focal  deficit present.     Mental Status: She is alert and oriented to person, place, and time.     Cranial Nerves: No cranial nerve deficit.  Skin:    General: Skin is warm and dry.  Psychiatric:        Mood and Affect: Mood normal.        Behavior:  Behavior normal.        Thought Content: Thought content normal.        Judgment: Judgment normal.  Vitals reviewed.   Results for orders placed or performed in visit on 12/21/20 (from the past 24 hour(s))  POCT Wet Prep with KOH     Status: Normal   Collection Time: 12/21/20 10:56 AM  Result Value Ref Range   Trichomonas, UA Negative    Clue Cells Wet Prep HPF POC neg    Epithelial Wet Prep HPF POC     Yeast Wet Prep HPF POC neg    Bacteria Wet Prep HPF POC     RBC Wet Prep HPF POC     WBC Wet Prep HPF POC     KOH Prep POC Negative Negative      Assessment/Plan: Encounter for annual routine gynecological examination  Encounter for screening mammogram for malignant neoplasm of breast - Plan: MM 3D SCREEN BREAST BILATERAL; pt to sched mammo 5/23  Family history of breast cancer--Pt is MyRisk neg  Increased risk of breast cancer--pt aware of recommendations of monthly SBE, yearly CBE and mammos, and scr breast MRI. Will call after 5/23 mammo for MRI ref prn. Too late to do this yr. Cont Vit D supp  Hormone replacement therapy (HRT) - Plan: estradiol (VIVELLE-DOT) 0.0375 MG/24HR  Vasomotor symptoms due to menopause - Plan: estradiol (VIVELLE-DOT) 0.0375 MG/24HR; Rx RF. Doing well.   External hemorrhoid--will call for analpram RF prn  Vaginal itching - Plan: fluconazole (DIFLUCAN) 150 MG tablet; neg wet prep/exam, pos sx. Rx diflucan. F/u prn.    Meds ordered this encounter  Medications   fluconazole (DIFLUCAN) 150 MG tablet    Sig: Take 1 tablet (150 mg total) by mouth once for 1 dose.    Dispense:  1 tablet    Refill:  0    Order Specific Question:   Supervising Provider    Answer:   Gae Dry [[id number]]   estradiol (VIVELLE-DOT) 0.0375 MG/24HR    Sig: PLACE 1 PATCH ONTO THE SKIN 2 TIMES A WEEK.    Dispense:  24 patch    Refill:  3    Order Specific Question:   Supervising Provider    Answer:   Gae Dry [[id number]]           GYN counsel breast self exam,  mammography screening, adequate intake of calcium and vitamin D, diet and exercise    F/U  Return in about 1 year (around 12/21/2021).   B. , PA-C 12/21/2020 10:57 AM

## 2020-12-18 ENCOUNTER — Other Ambulatory Visit: Payer: Self-pay

## 2020-12-21 ENCOUNTER — Other Ambulatory Visit: Payer: Self-pay

## 2020-12-21 ENCOUNTER — Encounter: Payer: Self-pay | Admitting: Obstetrics and Gynecology

## 2020-12-21 ENCOUNTER — Ambulatory Visit (INDEPENDENT_AMBULATORY_CARE_PROVIDER_SITE_OTHER): Payer: 59 | Admitting: Obstetrics and Gynecology

## 2020-12-21 VITALS — BP 100/60 | Ht 66.0 in | Wt 131.0 lb

## 2020-12-21 DIAGNOSIS — K644 Residual hemorrhoidal skin tags: Secondary | ICD-10-CM | POA: Diagnosis not present

## 2020-12-21 DIAGNOSIS — N898 Other specified noninflammatory disorders of vagina: Secondary | ICD-10-CM

## 2020-12-21 DIAGNOSIS — N951 Menopausal and female climacteric states: Secondary | ICD-10-CM

## 2020-12-21 DIAGNOSIS — Z803 Family history of malignant neoplasm of breast: Secondary | ICD-10-CM | POA: Diagnosis not present

## 2020-12-21 DIAGNOSIS — Z01419 Encounter for gynecological examination (general) (routine) without abnormal findings: Secondary | ICD-10-CM

## 2020-12-21 DIAGNOSIS — Z1231 Encounter for screening mammogram for malignant neoplasm of breast: Secondary | ICD-10-CM | POA: Diagnosis not present

## 2020-12-21 DIAGNOSIS — Z9189 Other specified personal risk factors, not elsewhere classified: Secondary | ICD-10-CM

## 2020-12-21 DIAGNOSIS — Z79890 Hormone replacement therapy (postmenopausal): Secondary | ICD-10-CM | POA: Diagnosis not present

## 2020-12-21 LAB — POCT WET PREP WITH KOH
Clue Cells Wet Prep HPF POC: NEGATIVE
KOH Prep POC: NEGATIVE
Trichomonas, UA: NEGATIVE
Yeast Wet Prep HPF POC: NEGATIVE

## 2020-12-21 MED ORDER — FLUCONAZOLE 150 MG PO TABS
150.0000 mg | ORAL_TABLET | Freq: Once | ORAL | 0 refills | Status: AC
  Filled 2020-12-21: qty 1, 1d supply, fill #0

## 2020-12-21 MED ORDER — ESTRADIOL 0.0375 MG/24HR TD PTTW
MEDICATED_PATCH | TRANSDERMAL | 3 refills | Status: DC
  Filled 2020-12-21: qty 24, 84d supply, fill #0
  Filled 2021-04-09: qty 24, 84d supply, fill #1
  Filled 2021-07-01: qty 24, 84d supply, fill #2
  Filled 2021-09-28: qty 24, 84d supply, fill #3

## 2020-12-21 NOTE — Patient Instructions (Signed)
I value your feedback and you entrusting us with your care. If you get a [place] patient survey, I would appreciate you taking the time to let us know about your experience today. Thank you! ? ? ?

## 2021-01-12 ENCOUNTER — Other Ambulatory Visit: Payer: Self-pay

## 2021-02-10 ENCOUNTER — Other Ambulatory Visit: Payer: Self-pay | Admitting: Family Medicine

## 2021-02-10 ENCOUNTER — Other Ambulatory Visit: Payer: Self-pay

## 2021-02-10 DIAGNOSIS — J309 Allergic rhinitis, unspecified: Secondary | ICD-10-CM

## 2021-02-10 MED FILL — Fluticasone Propionate Nasal Susp 50 MCG/ACT: NASAL | 90 days supply | Qty: 48 | Fill #0 | Status: AC

## 2021-02-10 NOTE — Telephone Encounter (Signed)
Requested Prescriptions  Pending Prescriptions Disp Refills   fluticasone (FLONASE) 50 MCG/ACT nasal spray 16 g 2    Sig: Place 2 sprays into both nostrils daily.     Ear, Nose, and Throat: Nasal Preparations - Corticosteroids Passed - 02/10/2021  5:28 PM      Passed - Valid encounter within last 12 months    Recent Outpatient Visits          3 months ago Fatty liver   [facility] Jerrol Banana., MD   7 months ago Annual physical exam   [facility] Jerrol Banana., MD   8 months ago Antibiotic-induced yeast infection   [facility] [place], [place], [place]   1 year ago Postmenopausal   [facility] Jerrol Banana., MD   1 year ago Annual physical exam   [facility] Jerrol Banana., MD      Future Appointments            In 3 weeks Brendolyn Patty, MD [place]   In 5 months Jerrol Banana., MD [facility], [place]

## 2021-02-11 ENCOUNTER — Other Ambulatory Visit: Payer: Self-pay

## 2021-02-15 ENCOUNTER — Other Ambulatory Visit: Payer: Self-pay

## 2021-02-15 ENCOUNTER — Ambulatory Visit
Admission: RE | Admit: 2021-02-15 | Discharge: 2021-02-15 | Disposition: A | Discharge status: Home / Self Care | Payer: 59 | Source: Ambulatory Visit | Attending: Emergency Medicine | Admitting: Emergency Medicine

## 2021-02-15 VITALS — BP 130/79 | HR 70 | Temp 97.9°F

## 2021-02-15 DIAGNOSIS — B349 Viral infection, unspecified: Secondary | ICD-10-CM

## 2021-02-15 LAB — POCT INFLUENZA A/B
Influenza A, POC: NEGATIVE
Influenza B, POC: NEGATIVE

## 2021-02-15 NOTE — ED Provider Notes (Signed)
Roderic Palau    CSN: [id number] Arrival date & time: 02/15/21  1251      History   Chief Complaint Chief Complaint  Patient presents with   Nasal Congestion   Sore Throat   Cough    HPI HELIA HAESE is a 60 y.o. female.  Patient presents with 3-day history of low-grade fever, congestion, runny nose, sore throat, cough.  Treatment at home with DayQuil and NyQuil.  No wheezing or shortness of breath.  No rash, vomiting, diarrhea, or other symptoms.  Her medical history includes asthma, seasonal allergies, mitral valve prolapse, arthritis, GERD.  The history is provided by the patient and medical records.   Past Medical History:  Diagnosis Date   Abnormal Pap smear 1989   s/p colposcopy   Allergy    hay fever   Arthritis    Asthma    BRCA negative 10/2018   MyRisk neg except ATM VUS   Chicken pox    Colon polyp    Complication of anesthesia    Family history of adverse reaction to anesthesia    sister - slow to wake   Family history of breast cancer    Fatty liver    seen by hepatology   GERD (gastroesophageal reflux disease)    Increased risk of breast cancer 10/2018   IBIS=24.7%/riskscore=15.8%   Mitral valve prolapse    PONV (postoperative nausea and vomiting)    Shingles    waist, right side   UTI (lower urinary tract infection)    Wears contact lenses     Patient Active Problem List   Diagnosis Date Noted   Vasomotor symptoms due to menopause 12/21/2020   COVID-19 08/28/2020   Cough 08/28/2020   Nasal sinus congestion 08/28/2020   Sore throat (viral) 08/28/2020   Encounter for fitting and adjustment of other gastrointestinal appliance and device    Abnormal liver enzymes    Idiopathic acute pancreatitis without infection or necrosis    Jaundice    Elevated liver enzymes    Acute gallstone pancreatitis 05/16/2020   Increased risk of breast cancer 11/11/2019   External hemorrhoid 11/11/2019   Family history of breast cancer 11/06/2018    Chronic nonintractable headache 10/31/2017   Special screening for malignant neoplasms, colon    Fatty liver 04/29/2016   Abdominal pain, chronic, right lower quadrant 01/29/2016   Palpitations 01/29/2016   Subclinical hyperthyroidism 04/24/2015   Encounter for general adult medical examination with abnormal findings 11/02/2012   Personal history of colonic polyps 10/29/2012   GERD (gastroesophageal reflux disease) 09/21/2012   Chronic gastritis 09/21/2012   Dyspareunia 04/25/2012   Cervical radiculopathy 11/03/2011   Seasonal allergies 10/21/2011    Past Surgical History:  Procedure Laterality Date   ABDOMINAL HYSTERECTOMY  2008   BREAST BIOPSY Left    benign   calcaneous osteotomy  06/2011   COLONOSCOPY WITH PROPOFOL N/A 02/17/2017   Procedure: COLONOSCOPY WITH PROPOFOL;  Surgeon: Lucilla Lame, MD;  Location: [facility];  Service: Endoscopy;  Laterality: N/A;   ENDOSCOPIC RETROGRADE CHOLANGIOPANCREATOGRAPHY (ERCP) WITH PROPOFOL N/A 05/17/2020   Procedure: ENDOSCOPIC RETROGRADE CHOLANGIOPANCREATOGRAPHY (ERCP) WITH PROPOFOL;  Surgeon: Lucilla Lame, MD;  Location: [facility] ENDOSCOPY;  Service: Endoscopy;  Laterality: N/A;   ERCP N/A 06/18/2020   Procedure: ENDOSCOPIC RETROGRADE CHOLANGIOPANCREATOGRAPHY (ERCP);  Surgeon: Lucilla Lame, MD;  Location: [facility] ENDOSCOPY;  Service: Endoscopy;  Laterality: N/A;   ESOPHAGOGASTRODUODENOSCOPY (EGD) WITH PROPOFOL N/A 02/17/2017   Procedure: ESOPHAGOGASTRODUODENOSCOPY (EGD) WITH PROPOFOL;  Surgeon: Lucilla Lame,  MD;  Location: [place];  Service: Endoscopy;  Laterality: N/A;   foot and ankle surgery     OOPHORECTOMY     TONSILLECTOMY AND ADENOIDECTOMY  2008    OB History     Gravida  2   Para  2   Term  2   Preterm      AB      Living  2      SAB      IAB      Ectopic      Multiple      Live Births  2            Home Medications    Prior to Admission medications   Medication Sig Start Date End Date  Taking? Authorizing Provider  acetaminophen (TYLENOL) 500 MG tablet Take 500 mg by mouth every 4 (four) hours as needed.    [provider]  calcium-vitamin D (OSCAL WITH D) 500-200 MG-UNIT TABS tablet Take by mouth.    [provider]  doxycycline (PERIOSTAT) 20 MG tablet Take 2 tablets (40 mg total) by mouth daily. Take with food 11/03/20 03/12/21  Brendolyn Patty, MD  estradiol (VIVELLE-DOT) 0.0375 MG/24HR PLACE 1 PATCH ONTO THE SKIN 2 TIMES A WEEK. 12/21/20 95/18/84  Copland, Elmo Putt B, PA-C  fluticasone (FLONASE) 50 MCG/ACT nasal spray Place 2 sprays into both nostrils daily. 02/10/21   Jerrol Banana., MD  ibuprofen (ADVIL) 800 MG tablet TAKE 1 TABLET (800 MG TOTAL) BY MOUTH EVERY 8 (EIGHT) HOURS AS NEEDED. 05/19/20 05/19/21  Tylene Fantasia, PA-C  Lifitegrast Shirley Friar) 5 % SOLN PLACE 1 DROP INTO BOTH EYES TWICE A DAY 07/17/20 07/17/21    loratadine (CLARITIN) 10 MG tablet Take 10 mg by mouth daily.    [provider]  metroNIDAZOLE (METROCREAM) 0.75 % cream Apply to nose 1-2 times a day for rosacea. 08/10/20   Brendolyn Patty, MD  Multiple Vitamins-Minerals (PRESERVISION AREDS 2 PO) Take by mouth 2 (two) times daily.    [provider]  mupirocin ointment (BACTROBAN) 2 % Apply 1 application topically 2 (two) times daily. Apply inside both nares for 1 week 11/03/20   Brendolyn Patty, MD  neomycin-polymyxin b-dexamethasone (MAXITROL) 3.5-10000-0.1 OINT Apply 1/4 inch on eyelid once a day 09/07/20     pantoprazole (PROTONIX) 40 MG tablet Take 1 tablet (40 mg total) by mouth 2 (two) times daily. 07/09/20   Jerrol Banana., MD  Tavaborole [facility]) 5 % SOLN Apply to affected toenail nightly until improved. 06/10/20   Brendolyn Patty, MD  valACYclovir (VALTREX) 1000 MG tablet TAKE 2 TABLETS AT THE ONSET OF A FEVER BLISTER AND THEN TAKE 2 TABLETS 12 HOURS LATER. USE AS NEEDED 07/09/20 07/09/21  Jerrol Banana., MD    Family History Family History  Problem  Relation Age of Onset   Arthritis Mother    Hypertension Mother    Mental illness Mother    Diabetes Mother    Breast cancer Mother 7       right breast   Hyperlipidemia Father    Heart disease Father    Cancer Father        lung   Mental illness Sister    Myasthenia gravis Sister    Multiple sclerosis Sister    Multiple sclerosis Other    Stroke Maternal Aunt    Breast cancer Maternal Aunt 70       right breast   Stroke Maternal Grandmother  Hypertension Maternal Grandmother    Diabetes Maternal Grandmother    Breast cancer Maternal Aunt 72       right breast    Social History Social History   Tobacco Use   Smoking status: Never   Smokeless tobacco: Never  Vaping Use   Vaping Use: Never used  Substance Use Topics   Alcohol use: No   Drug use: No     Allergies   Penicillins and Cinnamon   Review of Systems Review of Systems  Constitutional:  Positive for fever. Negative for chills.  HENT:  Positive for congestion, rhinorrhea and sore throat. Negative for ear pain.   Respiratory:  Positive for cough. Negative for shortness of breath and wheezing.   Cardiovascular:  Negative for chest pain and palpitations.  Gastrointestinal:  Negative for diarrhea and vomiting.  Skin:  Negative for color change and rash.  All other systems reviewed and are negative.   Physical Exam Triage Vital Signs ED Triage Vitals  Enc Vitals Group     BP 02/15/21 1308 130/79     Pulse Rate 02/15/21 1308 70     Resp --      Temp 02/15/21 1308 97.9 F (36.6 C)     Temp Source 02/15/21 1308 Oral     SpO2 02/15/21 1308 97 %     Weight --      Height --      Head Circumference --      Peak Flow --      Pain Score 02/15/21 1318 0     Pain Loc --      Pain Edu? --      Excl. in [place]? --    No data found.  Updated Vital Signs BP 130/79 (BP Location: Right Arm)    Pulse 70    Temp 97.9 F (36.6 C) (Oral)    SpO2 97%   Visual Acuity Right Eye Distance:   Left Eye Distance:    Bilateral Distance:    Right Eye Near:   Left Eye Near:    Bilateral Near:     Physical Exam Vitals and nursing note reviewed.  Constitutional:      General: She is not in acute distress.    Appearance: She is well-developed.  HENT:     Right Ear: Tympanic membrane normal.     Left Ear: Tympanic membrane normal.     Nose: Congestion and rhinorrhea present.     Mouth/Throat:     Mouth: Mucous membranes are moist.     Pharynx: Oropharynx is clear.  Cardiovascular:     Rate and Rhythm: Normal rate and regular rhythm.     Heart sounds: Normal heart sounds.  Pulmonary:     Effort: Pulmonary effort is normal. No respiratory distress.     Breath sounds: Normal breath sounds.  Musculoskeletal:     Cervical back: Neck supple.  Skin:    General: Skin is warm and dry.  Neurological:     Mental Status: She is alert.  Psychiatric:        Mood and Affect: Mood normal.        Behavior: Behavior normal.     UC Treatments / Results  Labs (all labs ordered are listed, but only abnormal results are displayed) Labs Reviewed  COVID-19, FLU A+B NAA  NOVEL CORONAVIRUS, NAA  POCT INFLUENZA A/B    EKG   Radiology No results found.  Procedures Procedures (including critical care time)  Medications Ordered in UC  Medications - No data to display  Initial Impression / Assessment and Plan / UC Course  I have reviewed the triage vital signs and the nursing notes.  Pertinent labs & imaging results that were available during my care of the patient were reviewed by me and considered in my medical decision making (see chart for details).    Viral  illness.  Rapid flu negative.  COVID pending.  Instructed patient to self quarantine per CDC guidelines.  Discussed symptomatic treatment including Tylenol or ibuprofen, rest, hydration.  Instructed patient to follow up with PCP if symptoms are not improving.  Patient agrees to plan of care.   Final Clinical Impressions(s) / UC Diagnoses    Final diagnoses:  Viral illness     Discharge Instructions      Your flu test is negative.  Your COVID test is pending.    Take Tylenol or ibuprofen as needed for fever or discomfort.  Rest and keep yourself hydrated.    Follow-up with your primary care provider if your symptoms are not improving.         ED Prescriptions   None    PDMP not reviewed this encounter.   Sharion Balloon, NP 02/15/21 1416

## 2021-02-15 NOTE — ED Triage Notes (Signed)
Pt c/o cough, runny nose, ST, fatigue x 3 days.

## 2021-02-15 NOTE — Discharge Instructions (Addendum)
Your flu test is negative.  Your COVID test is pending.    Take Tylenol or ibuprofen as needed for fever or discomfort.  Rest and keep yourself hydrated.    Follow-up with your primary care provider if your symptoms are not improving.

## 2021-02-17 LAB — NOVEL CORONAVIRUS, NAA: SARS-CoV-2, NAA: NOT DETECTED

## 2021-02-17 LAB — SARS-COV-2, NAA 2 DAY TAT

## 2021-03-02 ENCOUNTER — Encounter (INDEPENDENT_AMBULATORY_CARE_PROVIDER_SITE_OTHER): Payer: 59 | Admitting: Ophthalmology

## 2021-03-02 ENCOUNTER — Other Ambulatory Visit: Payer: Self-pay

## 2021-03-02 DIAGNOSIS — H2513 Age-related nuclear cataract, bilateral: Secondary | ICD-10-CM

## 2021-03-02 DIAGNOSIS — H353132 Nonexudative age-related macular degeneration, bilateral, intermediate dry stage: Secondary | ICD-10-CM

## 2021-03-02 DIAGNOSIS — H43813 Vitreous degeneration, bilateral: Secondary | ICD-10-CM | POA: Diagnosis not present

## 2021-03-09 ENCOUNTER — Other Ambulatory Visit: Payer: Self-pay

## 2021-03-09 ENCOUNTER — Ambulatory Visit (INDEPENDENT_AMBULATORY_CARE_PROVIDER_SITE_OTHER): Payer: 59 | Admitting: Dermatology

## 2021-03-09 DIAGNOSIS — L719 Rosacea, unspecified: Secondary | ICD-10-CM | POA: Diagnosis not present

## 2021-03-09 DIAGNOSIS — B351 Tinea unguium: Secondary | ICD-10-CM | POA: Diagnosis not present

## 2021-03-09 NOTE — Patient Instructions (Addendum)
Rosacea  What is rosacea? Rosacea (say: ro-zay-sha) is a common skin disease that usually begins as a trend of flushing or blushing easily.  As rosacea progresses, a persistent redness in the center of the face will develop and may gradually spread beyond the nose and cheeks to the forehead and chin.  In some cases, the ears, chest, and back could be affected.  Rosacea may appear as tiny blood vessels or small red bumps that occur in crops.  Frequently they can contain pus, and are called pustules.  If the bumps do not contain pus, they are referred to as papules.  Rarely, in prolonged, untreated cases of rosacea, the oil glands of the nose and cheeks may become permanently enlarged.  This is called rhinophyma, and is seen more frequently in men.  Signs and Risks In its beginning stages, rosacea tends to come and go, which makes it difficult to recognize.  It can start as intermittent flushing of the face.  Eventually, blood vessels may become permanently visible.  Pustules and papules can appear, but can be mistaken for adult acne.  People of all races, ages, genders and ethnic groups are at risk of developing rosacea.  However, it is more common in women (especially around menopause) and adults with fair skin between the ages of 46 and 28.  Treatment Dermatologists typically recommend a combination of treatments to effectively manage rosacea.  Treatment can improve symptoms and may stop the progression of the rosacea.  Treatment may involve both topical and oral medications.  The tetracycline antibiotics are often used for their anti-inflammatory effect; however, because of the possibility of developing antibiotic resistance, they should not be used long term at full dose.  For dilated blood vessels the options include electrodessication (uses electric current through a small needle), laser treatment, and cosmetics to hide the redness.   With all forms of treatment, improvement is a slow process, and  patients may not see any results for the first 3-4 weeks.  It is very important to avoid the sun and other triggers.  Patients must wear sunscreen daily.  Skin Care Instructions: Cleanse the skin with a mild soap such as CeraVe cleanser, Cetaphil cleanser, or Dove soap once or twice daily as needed. Moisturize with Eucerin Redness Relief Daily Perfecting Lotion (has a subtle green tint), CeraVe Moisturizing Cream, or Oil of Olay Daily Moisturizer with sunscreen every morning and/or night as recommended. Makeup should be non-comedogenic (wont clog pores) and be labeled for sensitive skin. Good choices for cosmetics are: Neutrogena, Almay, and Physicians Formula.  Any product with a green tint tends to offset a red complexion. If your eyes are dry and irritated, use artificial tears 2-3 times per day and cleanse the eyelids daily with baby shampoo.  Have your eyes examined at least every 2 years.  Be sure to tell your eye doctor that you have rosacea. Alcoholic beverages tend to cause flushing of the skin, and may make rosacea worse. Always wear sunscreen, protect your skin from extreme hot and cold temperatures, and avoid spicy foods, hot drinks, and mechanical irritation such as rubbing, scrubbing, or massaging the face.  Avoid harsh skin cleansers, cleansing masks, astringents, and exfoliation. If a particular product burns or makes your face feel tight, then it is likely to flare your rosacea. If you are having difficulty finding a sunscreen that you can tolerate, you may try switching to a chemical-free sunscreen.  These are ones whose active ingredient is zinc oxide or titanium dioxide  only.  They should also be fragrance free, non-comedogenic, and labeled for sensitive skin. Rosacea triggers may vary from person to person.  There are a variety of foods that have been reported to trigger rosacea.  Some patients find that keeping a diary of what they were doing when they flared helps them avoid  triggers.    If You Need Anything After Your Visit  If you have any questions or concerns for your doctor, please call our main line at [phone] and press option 4 to reach your doctor's medical assistant. If no one answers, please leave a voicemail as directed and we will return your call as soon as possible. Messages left after 4 pm will be answered the following business day.   You may also send [place] a message via [place]. We typically respond to MyChart messages within 1-2 business days.  For prescription refills, please ask your pharmacy to contact our office. Our fax number is [phone].  If you have an urgent issue when the clinic is closed that cannot wait until the next business day, you can page your doctor at the number below.    Please note that while we do our best to be available for urgent issues outside of office hours, we are not available 24/7.   If you have an urgent issue and are unable to reach [place], you may choose to seek medical care at your doctor's office, retail clinic, urgent care center, or emergency room.  If you have a medical emergency, please immediately call 911 or go to the emergency department.  Pager Numbers  - Dr. Nehemiah Massed: [phone]  - Dr. Laurence Ferrari: [phone]  - Dr. Nicole Kindred: [phone]  In the event of inclement weather, please call our main line at [phone] for an update on the status of any delays or closures.  Dermatology Medication Tips: Please keep the boxes that topical medications come in in order to help keep track of the instructions about where and how to use these. Pharmacies typically print the medication instructions only on the boxes and not directly on the medication tubes.   If your medication is too expensive, please contact our office at [phone] option 4 or send [place] a message through [place].   We are unable to tell what your co-pay for medications will be in advance as this is different depending on your  insurance coverage. However, we may be able to find a substitute medication at lower cost or fill out paperwork to get insurance to cover a needed medication.   If a prior authorization is required to get your medication covered by your insurance company, please allow [place] 1-2 business days to complete this process.  Drug prices often vary depending on where the prescription is filled and some pharmacies may offer cheaper prices.  The website www.goodrx.com contains coupons for medications through different pharmacies. The prices here do not account for what the cost may be with help from insurance (it may be cheaper with your insurance), but the website can give you the price if you did not use any insurance.  - You can print the associated coupon and take it with your prescription to the pharmacy.  - You may also stop by our office during regular business hours and pick up a GoodRx coupon card.  - If you need your prescription sent electronically to a different pharmacy, notify our office through [facility] or by phone at [phone] option 4.     Si Usted [employer]  Despus de Su Visita  Tambin puede enviarnos un mensaje a travs de MyChart. Por lo general respondemos a los mensajes de MyChart en el transcurso de 1 a 2 das hbiles.  Para renovar recetas, por favor pida a su farmacia que se ponga en contacto con nuestra oficina. Harland Dingwall de fax es [place] [phone].  Si tiene un asunto urgente cuando la clnica est cerrada y que no puede esperar hasta el siguiente da hbil, puede llamar/localizar a su doctor(a) al nmero que aparece a continuacin.   Por favor, tenga en cuenta que aunque hacemos todo lo posible para estar disponibles para asuntos urgentes fuera del horario de [place], no estamos disponibles las 24 horas del da, los 7 das de la [place].   Si tiene un problema urgente y no puede comunicarse con nosotros, puede optar por buscar atencin mdica  en el  consultorio de su doctor(a), en una clnica privada, en un centro de atencin urgente o en una sala de emergencias.  Si tiene Engineering geologist, por favor llame inmediatamente al 911 o vaya a la sala de emergencias.  Nmeros de bper  - Dr. Nehemiah Massed: [phone]  - Dra. Moye: [phone]  - Dra. Nicole Kindred: [phone]  En caso de inclemencias del [place], por favor llame a Johnsie Kindred principal al [phone] para una actualizacin sobre el [place] de cualquier retraso o cierre.  Consejos para la medicacin en dermatologa: Por favor, guarde las cajas en las que vienen los medicamentos de uso tpico para ayudarle a seguir las instrucciones sobre dnde y cmo usarlos. Las farmacias generalmente imprimen las instrucciones del medicamento slo en las cajas y no directamente en los tubos del [place].   Si su medicamento es muy caro, por favor, pngase en contacto con Zigmund Daniel llamando al [phone] y presione la opcin 4 o envenos un mensaje a travs de Pharmacist, community.   No podemos decirle cul ser su copago por los medicamentos por adelantado ya que esto es diferente dependiendo de la cobertura de su seguro. Sin embargo, es posible que podamos encontrar un medicamento sustituto a Electrical engineer un formulario para que el seguro cubra el medicamento que se considera necesario.   Si se requiere una autorizacin previa para que su compaa de seguros [place] su medicamento, por favor permtanos de 1 a 2 das hbiles para completar este proceso.  Los precios de los medicamentos varan con frecuencia dependiendo del Environmental consultant de dnde se surte la receta y alguna farmacias pueden ofrecer precios ms baratos.  El sitio web www.goodrx.com tiene cupones para medicamentos de Airline pilot. Los precios aqu no tienen en cuenta lo que podra costar con la ayuda del seguro (puede ser ms barato con su seguro), pero el sitio web puede darle el precio si no utiliz Research scientist (physical sciences).  - Puede  imprimir el cupn correspondiente y llevarlo con su receta a la farmacia.  - Tambin puede pasar por nuestra oficina durante el horario de atencin regular y Charity fundraiser una tarjeta de cupones de GoodRx.  - Si necesita que su receta se enve electrnicamente a una farmacia diferente, informe a nuestra oficina a travs de MyChart de [place] o por telfono llamando al [phone] y presione la opcin 4.

## 2021-03-09 NOTE — Progress Notes (Signed)
° °  Follow-Up Visit   Subjective  Ashley Contreras is a 61 y.o. female who presents for the following: Follow-up.  Patient presents for 4 month follow-up rosacea. She was only able to tolerate doxycycline 20mg  2 po QD due to GI upset. The dryness and irritation of eyes did improve while she was taking it. She is using metronidazole 0.75% cream every night. She is also using Kerydin solution for her toenails, which are improving.   The following portions of the chart were reviewed this encounter and updated as appropriate:       Review of Systems:  No other skin or systemic complaints except as noted in HPI or Assessment and Plan.  Objective  Well appearing patient in no apparent distress; mood and affect are within normal limits.  A focused examination was performed including face. Relevant physical exam findings are noted in the Assessment and Plan.  Nose Mild erythema of the cheeks and nose.   Left Foot toenails Toenail dystrophy improved when compared to baseline photos,  Mild distal onycholysis     Assessment & Plan  Rosacea Nose  Skin improved, continued ocular symptoms since unable to tolerate generic doxy  Rosacea is a chronic progressive skin condition usually affecting the face of adults, causing redness and/or acne bumps. It is treatable but not curable. It sometimes affects the eyes (ocular rosacea) as well. It may respond to topical and/or systemic medication and can flare with stress, sun exposure, alcohol, exercise and some foods.  Daily application of broad spectrum spf 30+ sunscreen to face is recommended to reduce flares.  Continue metronidazole 0.75% cream qhs. Pt has.  Start Oracea 40mg  take 1 po QD with food. Samples boxes x 4 (8 pills) given to patient. Lot #[id number] Exp 10/23. If she can tolerate, will send in Rx to [facility]. If not tolerated, will send in Doryx 80 mg take 1/2 tab po QD with food #30 (write for 1 tab qd) to [place]. Could also try Doxy  monohydrate 50 mg PO qd, but not low dose.  Doxycycline should be taken with food to prevent nausea. Do not lay down for 30 minutes after taking. Be cautious with sun exposure and use good sun protection while on this medication. Pregnant women should not take this medication.    Related Medications metroNIDAZOLE (METROCREAM) 0.75 % cream Apply to nose 1-2 times a day for rosacea.  doxycycline (PERIOSTAT) 20 MG tablet Take 2 tablets (40 mg total) by mouth daily. Take with food  Tinea unguium Left Foot toenails  Improving compared to previous photos.   Continue Tavaborole 5% solution qhs toenails.   Avoid sleeping in socks, keep toes aired out as much as possible.   Related Medications Tavaborole (KERYDIN) 5 % SOLN Apply to affected toenail nightly until improved.   Return as scheduled for TBSE.  IJamesetta Orleans, CMA, am acting as scribe for Brendolyn Patty, MD .  Documentation: I have reviewed the above documentation for accuracy and completeness, and I agree with the above.  Brendolyn Patty MD

## 2021-03-19 ENCOUNTER — Encounter (INDEPENDENT_AMBULATORY_CARE_PROVIDER_SITE_OTHER): Payer: 59 | Admitting: Ophthalmology

## 2021-04-09 ENCOUNTER — Other Ambulatory Visit: Payer: Self-pay

## 2021-05-17 ENCOUNTER — Other Ambulatory Visit: Payer: Self-pay

## 2021-06-10 DIAGNOSIS — H524 Presbyopia: Secondary | ICD-10-CM | POA: Diagnosis not present

## 2021-07-01 ENCOUNTER — Other Ambulatory Visit: Payer: Self-pay

## 2021-07-01 MED ORDER — COVID-19 AT HOME ANTIGEN TEST VI KIT
PACK | 0 refills | Status: DC
  Filled 2021-07-01: qty 2, 4d supply, fill #0

## 2021-07-12 ENCOUNTER — Ambulatory Visit
Admission: RE | Admit: 2021-07-12 | Discharge: 2021-07-12 | Disposition: A | Discharge status: Home / Self Care | Payer: 59 | Source: Ambulatory Visit | Attending: Obstetrics and Gynecology | Admitting: Obstetrics and Gynecology

## 2021-07-12 ENCOUNTER — Encounter: Payer: Self-pay | Admitting: Family Medicine

## 2021-07-12 ENCOUNTER — Ambulatory Visit (INDEPENDENT_AMBULATORY_CARE_PROVIDER_SITE_OTHER): Payer: 59 | Admitting: Family Medicine

## 2021-07-12 VITALS — BP 100/66 | HR 64 | Resp 16 | Ht 66.0 in | Wt 130.0 lb

## 2021-07-12 DIAGNOSIS — R748 Abnormal levels of other serum enzymes: Secondary | ICD-10-CM

## 2021-07-12 DIAGNOSIS — Z780 Asymptomatic menopausal state: Secondary | ICD-10-CM

## 2021-07-12 DIAGNOSIS — Z1231 Encounter for screening mammogram for malignant neoplasm of breast: Secondary | ICD-10-CM | POA: Diagnosis not present

## 2021-07-12 DIAGNOSIS — E538 Deficiency of other specified B group vitamins: Secondary | ICD-10-CM | POA: Diagnosis not present

## 2021-07-12 DIAGNOSIS — Z23 Encounter for immunization: Secondary | ICD-10-CM | POA: Diagnosis not present

## 2021-07-12 DIAGNOSIS — E559 Vitamin D deficiency, unspecified: Secondary | ICD-10-CM | POA: Diagnosis not present

## 2021-07-12 DIAGNOSIS — K219 Gastro-esophageal reflux disease without esophagitis: Secondary | ICD-10-CM

## 2021-07-12 DIAGNOSIS — Z0000 Encounter for general adult medical examination without abnormal findings: Secondary | ICD-10-CM | POA: Diagnosis not present

## 2021-07-12 DIAGNOSIS — E0590 Thyrotoxicosis, unspecified without thyrotoxic crisis or storm: Secondary | ICD-10-CM | POA: Diagnosis not present

## 2021-07-12 DIAGNOSIS — Z1389 Encounter for screening for other disorder: Secondary | ICD-10-CM

## 2021-07-12 DIAGNOSIS — J309 Allergic rhinitis, unspecified: Secondary | ICD-10-CM | POA: Diagnosis not present

## 2021-07-12 LAB — POCT URINALYSIS DIPSTICK
Bilirubin, UA: NEGATIVE
Blood, UA: NEGATIVE
Glucose, UA: NEGATIVE
Ketones, UA: NEGATIVE
Leukocytes, UA: NEGATIVE
Nitrite, UA: NEGATIVE
Protein, UA: NEGATIVE
Spec Grav, UA: 1.025 (ref 1.010–1.025)
Urobilinogen, UA: 0.2 E.U./dL
pH, UA: 6 (ref 5.0–8.0)

## 2021-07-12 NOTE — Progress Notes (Signed)
Complete physical exam  I,April Miller,acting as a scribe for Megan Mans, MD.,have documented all relevant documentation on the behalf of Megan Mans, MD,as directed by  Megan Mans, MD while in the presence of Megan Mans, MD.   Patient: Ashley Contreras   DOB: [birth year]   60 y.o. Female  MRN: [id number] Visit Date: 07/12/2021  Today's healthcare provider: Megan Mans, MD   Chief Complaint  Patient presents with   Annual Exam   Subjective    Ashley Contreras is a 61 y.o. female who presents today for a complete physical exam.  She is married and is a mother of 2.  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  It is very stressful lately. She reports consuming a general diet. Home exercise routine includes walking. She generally feels well. She reports sleeping well. She does not have additional problems to discuss today.  HPI    Past Medical History:  Diagnosis Date   Abnormal Pap smear 1989   s/p colposcopy   Allergy    hay fever   Arthritis    Asthma    BRCA negative 10/2018   MyRisk neg except ATM VUS   Chicken pox    Colon polyp    Complication of anesthesia    Family history of adverse reaction to anesthesia    sister - slow to wake   Family history of breast cancer    Fatty liver    seen by hepatology   GERD (gastroesophageal reflux disease)    Increased risk of breast cancer 10/2018   IBIS=24.7%/riskscore=15.8%   Mitral valve prolapse    PONV (postoperative nausea and vomiting)    Shingles    waist, right side   UTI (lower urinary tract infection)    Wears contact lenses    Past Surgical History:  Procedure Laterality Date   ABDOMINAL HYSTERECTOMY  2008   BREAST BIOPSY Left    benign   calcaneous osteotomy  06/2011   COLONOSCOPY WITH PROPOFOL N/A 02/17/2017   Procedure: COLONOSCOPY WITH PROPOFOL;  Surgeon: Midge Minium, MD;  Location: [facility];  Service: Endoscopy;  Laterality: N/A;    ENDOSCOPIC RETROGRADE CHOLANGIOPANCREATOGRAPHY (ERCP) WITH PROPOFOL N/A 05/17/2020   Procedure: ENDOSCOPIC RETROGRADE CHOLANGIOPANCREATOGRAPHY (ERCP) WITH PROPOFOL;  Surgeon: Midge Minium, MD;  Location: [facility];  Service: Endoscopy;  Laterality: N/A;   ERCP N/A 06/18/2020   Procedure: ENDOSCOPIC RETROGRADE CHOLANGIOPANCREATOGRAPHY (ERCP);  Surgeon: Midge Minium, MD;  Location: [facility];  Service: Endoscopy;  Laterality: N/A;   ESOPHAGOGASTRODUODENOSCOPY (EGD) WITH PROPOFOL N/A 02/17/2017   Procedure: ESOPHAGOGASTRODUODENOSCOPY (EGD) WITH PROPOFOL;  Surgeon: Midge Minium, MD;  Location: [facility];  Service: Endoscopy;  Laterality: N/A;   foot and ankle surgery     OOPHORECTOMY     TONSILLECTOMY AND ADENOIDECTOMY  2008   Social History   Socioeconomic History   Marital status: Married    Spouse name: Not on file   Number of children: 2   Years of education: Not on file   Highest education level: Not on file  Occupational History   Occupation: Futures trader    Employer: [employer]  Tobacco Use   Smoking status: Never   Smokeless tobacco: Never  Vaping Use   Vaping Use: Never used  Substance and Sexual Activity   Alcohol use: No   Drug use: No   Sexual activity: Yes    Birth control/protection: Surgical    Comment: Hysterectomy  Other Topics Concern   Not on file  Social History Narrative   Lives in [place], 2 children. No pets.      Work - Medical sales representative      Diet - regular, healthy   Exercise -bike sometimes, limited by injury   Social Determinants of Corporate investment banker Strain: Not on file  Food Insecurity: Not on file  Transportation Needs: Not on file  Physical Activity: Not on file  Stress: Not on file  Social Connections: Not on file  Intimate Partner Violence: Not on file   Family Status  Relation Name Status   Mother  Deceased   Father  Deceased   Sister  Alive   Sister  Alive   Brother  Alive   Sister  Alive   Brother   Alive   Other Neice Alive   Mat Aunt  Deceased   MGM  Deceased   Mat Aunt  Deceased   Family History  Problem Relation Age of Onset   Arthritis Mother    Hypertension Mother    Mental illness Mother    Diabetes Mother    Breast cancer Mother 36       right breast   Hyperlipidemia Father    Heart disease Father    Cancer Father        lung   Mental illness Sister    Myasthenia gravis Sister    Multiple sclerosis Sister    Multiple sclerosis Other    Stroke Maternal Aunt    Breast cancer Maternal Aunt 32       right breast   Stroke Maternal Grandmother    Hypertension Maternal Grandmother    Diabetes Maternal Grandmother    Breast cancer Maternal Aunt 72       right breast   Allergies  Allergen Reactions   Penicillins Swelling   Cinnamon Swelling    Mouth and tongue become raw Swelling of tongue Swelling of tongue Mouth and tongue become raw     Patient Care Team: Maple Hudson., MD as PCP - General (Family Medicine)   Medications: Outpatient Medications Prior to Visit  Medication Sig   acetaminophen (TYLENOL) 500 MG tablet Take 500 mg by mouth every 4 (four) hours as needed.   calcium-vitamin D (OSCAL WITH D) 500-200 MG-UNIT TABS tablet Take by mouth.   estradiol (VIVELLE-DOT) 0.0375 MG/24HR PLACE 1 PATCH ONTO THE SKIN 2 TIMES A WEEK.   Fexofenadine HCl (ALLEGRA PO) Take by mouth.   fluticasone (FLONASE) 50 MCG/ACT nasal spray Place 2 sprays into both nostrils daily.   Lifitegrast (XIIDRA) 5 % SOLN PLACE 1 DROP INTO BOTH EYES TWICE A DAY   Multiple Vitamins-Minerals (PRESERVISION AREDS 2 PO) Take by mouth 2 (two) times daily.   mupirocin ointment (BACTROBAN) 2 % Apply 1 application topically 2 (two) times daily. Apply inside both nares for 1 week   pantoprazole (PROTONIX) 40 MG tablet Take 1 tablet (40 mg total) by mouth 2 (two) times daily.   Tavaborole (KERYDIN) 5 % SOLN Apply to affected toenail nightly until improved.   [DISCONTINUED] COVID-19 At  Home Antigen Test (CARESTART COVID-19 HOME TEST) KIT Use as directed   [DISCONTINUED] loratadine (CLARITIN) 10 MG tablet Take 10 mg by mouth daily. (Patient not taking: Reported on 07/12/2021)   [DISCONTINUED] metroNIDAZOLE (METROCREAM) 0.75 % cream Apply to nose 1-2 times a day for rosacea. (Patient not taking: Reported on 07/12/2021)   [DISCONTINUED] neomycin-polymyxin b-dexamethasone (MAXITROL) 3.5-10000-0.1 OINT Apply 1/4 inch  on eyelid once a day (Patient not taking: Reported on 07/12/2021)   No facility-administered medications prior to visit.    Review of Systems  Eyes:  Positive for itching.  Allergic/Immunologic: Positive for environmental allergies and food allergies.  All other systems reviewed and are negative.  Last metabolic panel Lab Results  Component Value Date   GLUCOSE 91 07/09/2020   NA 143 07/09/2020   K 5.4 (H) 07/09/2020   CL 105 07/09/2020   CO2 22 07/09/2020   BUN 19 07/09/2020   CREATININE 0.74 07/09/2020   EGFR 93 07/09/2020   CALCIUM 9.6 07/09/2020   PROT 6.5 07/09/2020   ALBUMIN 4.4 07/09/2020   LABGLOB 2.1 07/09/2020   AGRATIO 2.1 07/09/2020   BILITOT 0.4 07/09/2020   ALKPHOS 126 (H) 07/09/2020   AST 15 07/09/2020   ALT 17 07/09/2020   ANIONGAP 6 05/19/2020      Objective     BP 100/66 (BP Location: Left Arm, Patient Position: Sitting, Cuff Size: Normal)   Pulse 64   Resp 16   Ht 5\' 6"  (1.676 m)   Wt 130 lb (59 kg)   SpO2 100%   BMI 20.98 kg/m  BP Readings from Last 3 Encounters:  07/12/21 100/66  02/15/21 130/79  12/21/20 100/60   Wt Readings from Last 3 Encounters:  07/12/21 130 lb (59 kg)  12/21/20 131 lb (59.4 kg)  11/09/20 132 lb (59.9 kg)       Physical Exam Vitals reviewed.  Constitutional:      Appearance: Normal appearance.  HENT:     Right Ear: Tympanic membrane, ear canal and external ear normal.     Left Ear: Tympanic membrane, ear canal and external ear normal.     Nose: Nose normal.     Mouth/Throat:      Mouth: Mucous membranes are dry.     Pharynx: Oropharynx is clear.  Eyes:     Extraocular Movements: Extraocular movements intact.     Conjunctiva/sclera: Conjunctivae normal.     Pupils: Pupils are equal, round, and reactive to light.  Cardiovascular:     Rate and Rhythm: Normal rate and regular rhythm.     Pulses: Normal pulses.     Heart sounds: Normal heart sounds.  Pulmonary:     Effort: Pulmonary effort is normal.     Breath sounds: Normal breath sounds.  Chest:  Breasts:    Breasts are symmetrical.     Right: Normal. No swelling, bleeding, inverted nipple, mass, nipple discharge, skin change or tenderness.     Left: Normal. No swelling, bleeding, inverted nipple, mass, nipple discharge, skin change or tenderness.  Abdominal:     Palpations: Abdomen is soft.  Musculoskeletal:     Cervical back: Neck supple.     Right lower leg: No edema.     Left lower leg: No edema.  Skin:    General: Skin is warm and dry.  Neurological:     General: No focal deficit present.     Mental Status: She is alert and oriented to person, place, and time.     Comments: Mild decrease sensation to monofilament and distal right foot  Psychiatric:        Mood and Affect: Mood normal.        Behavior: Behavior normal.        Thought Content: Thought content normal.        Judgment: Judgment normal.      Last depression screening scores  07/12/2021    9:14 AM 07/09/2020    9:21 AM 01/06/2020    9:08 AM  PHQ 2/9 Scores  PHQ - 2 Score 0 0 0  PHQ- 9 Score 0 0 1   Last fall risk screening    07/12/2021    9:14 AM  Fall Risk   Falls in the past year? 0  Number falls in past yr: 0  Injury with Fall? 0  Risk for fall due to : No Fall Risks  Follow up Falls evaluation completed   Last Audit-C alcohol use screening    07/12/2021    9:14 AM  Alcohol Use Disorder Test (AUDIT)  1. How often do you have a drink containing alcohol? 1  2. How many drinks containing alcohol do you have on a  typical day when you are drinking? 0  3. How often do you have six or more drinks on one occasion? 0  AUDIT-C Score 1   A score of 3 or more in women, and 4 or more in men indicates increased risk for alcohol abuse, EXCEPT if all of the points are from question 1   Results for orders placed or performed in visit on 07/12/21  POCT urinalysis dipstick  Result Value Ref Range   Color, UA Yellow    Clarity, UA Clear    Glucose, UA Negative Negative   Bilirubin, UA Negative    Ketones, UA Negative    Spec Grav, UA 1.025 1.010 - 1.025   Blood, UA Negative    pH, UA 6.0 5.0 - 8.0   Protein, UA Negative Negative   Urobilinogen, UA 0.2 0.2 or 1.0 E.U./dL   Nitrite, UA Negative    Leukocytes, UA Negative Negative    Assessment & Plan    Routine Health Maintenance and Physical Exam  Exercise Activities and Dietary recommendations  Goals   None     Immunization History  Administered Date(s) Administered   Hep A / Hep B 04/29/2016, 06/02/2016, 11/09/2016   Influenza Whole 11/22/2014, 11/26/2015   Influenza,inj,Quad PF,6+ Mos 11/27/2020   Influenza-Unspecified 10/23/2011, 11/26/2014, 11/10/2016, 11/19/2019   PFIZER(Purple Top)SARS-COV-2 Vaccination 02/18/2019, 03/11/2019, 12/13/2019   Tdap 09/22/2010, 07/12/2021    Health Maintenance  Topic Date Due   Zoster Vaccines- Shingrix (1 of 2) Never done   COVID-19 Vaccine (4 - Booster for Pfizer series) 02/07/2020   INFLUENZA VACCINE  09/21/2021   PAP SMEAR-Modifier  11/05/2021   MAMMOGRAM  06/23/2022   COLONOSCOPY (Pts 45-54yrs Insurance coverage will need to be confirmed)  02/18/2027   TETANUS/TDAP  07/13/2031   Hepatitis C Screening  Completed   HIV Screening  Completed   HPV VACCINES  Aged Out    Discussed health benefits of physical activity, and encouraged her to engage in regular exercise appropriate for her age and condition.  1. Annual physical exam Well woman exam per [facility] at [facility] - Lipid panel -  TSH - CBC w/Diff/Platelet - Comprehensive Metabolic Panel (CMET)  2. Subclinical hyperthyroidism If TSH shows lower refer back to [facility] endocrinology - Lipid panel - TSH - CBC w/Diff/Platelet - Comprehensive Metabolic Panel (CMET)  3. Postmenopausal  - Lipid panel - TSH - CBC w/Diff/Platelet - Comprehensive Metabolic Panel (CMET)  4. Gastroesophageal reflux disease, unspecified whether esophagitis present  - Lipid panel - TSH - CBC w/Diff/Platelet - Comprehensive Metabolic Panel (CMET)  5. Abnormal liver enzymes Due to fatty liver.  Continue to work on diet and exercise. - Lipid panel - TSH -  CBC w/Diff/Platelet - Comprehensive Metabolic Panel (CMET)  6. Allergic rhinitis, unspecified seasonality, unspecified trigger  - Lipid panel - TSH - CBC w/Diff/Platelet - Comprehensive Metabolic Panel (CMET)  7. B12 deficiency  - Vitamin B12  8. Vitamin D deficiency  - VITAMIN D 25 Hydroxy (Vit-D Deficiency, Fractures)  9. Screening for blood or protein in urine  - POCT urinalysis dipstick  10. Need for Tdap vaccination  - Tdap vaccine greater than or equal to 7yo IM   Return in about 1 year (around 07/13/2022).     I, Megan Mans, MD, have reviewed all documentation for this visit. The documentation on 07/12/21 for the exam, diagnosis, procedures, and orders are all accurate and complete.    Jaziya Obarr Wendelyn Breslow, MD  [facility] [phone] (phone) [phone] (fax)  [facility]

## 2021-07-13 LAB — CBC WITH DIFFERENTIAL/PLATELET
Basophils Absolute: 0 10*3/uL (ref 0.0–0.2)
Basos: 1 %
EOS (ABSOLUTE): 0.1 10*3/uL (ref 0.0–0.4)
Eos: 2 %
Hematocrit: 43.4 % (ref 34.0–46.6)
Hemoglobin: 14.9 g/dL (ref 11.1–15.9)
Immature Grans (Abs): 0 10*3/uL (ref 0.0–0.1)
Immature Granulocytes: 0 %
Lymphocytes Absolute: 1.4 10*3/uL (ref 0.7–3.1)
Lymphs: 30 %
MCH: 32.6 pg (ref 26.6–33.0)
MCHC: 34.3 g/dL (ref 31.5–35.7)
MCV: 95 fL (ref 79–97)
Monocytes Absolute: 0.5 10*3/uL (ref 0.1–0.9)
Monocytes: 10 %
Neutrophils Absolute: 2.7 10*3/uL (ref 1.4–7.0)
Neutrophils: 57 %
Platelets: 225 10*3/uL (ref 150–450)
RBC: 4.57 x10E6/uL (ref 3.77–5.28)
RDW: 11.9 % (ref 11.7–15.4)
WBC: 4.8 10*3/uL (ref 3.4–10.8)

## 2021-07-13 LAB — COMPREHENSIVE METABOLIC PANEL
ALT: 12 IU/L (ref 0–32)
AST: 16 IU/L (ref 0–40)
Albumin/Globulin Ratio: 1.8 (ref 1.2–2.2)
Albumin: 4.3 g/dL (ref 3.8–4.9)
Alkaline Phosphatase: 76 IU/L (ref 44–121)
BUN/Creatinine Ratio: 29 — ABNORMAL HIGH (ref 12–28)
BUN: 20 mg/dL (ref 8–27)
Bilirubin Total: 0.4 mg/dL (ref 0.0–1.2)
CO2: 25 mmol/L (ref 20–29)
Calcium: 9.2 mg/dL (ref 8.7–10.3)
Chloride: 104 mmol/L (ref 96–106)
Creatinine, Ser: 0.7 mg/dL (ref 0.57–1.00)
Globulin, Total: 2.4 g/dL (ref 1.5–4.5)
Glucose: 93 mg/dL (ref 70–99)
Potassium: 4.6 mmol/L (ref 3.5–5.2)
Sodium: 139 mmol/L (ref 134–144)
Total Protein: 6.7 g/dL (ref 6.0–8.5)
eGFR: 99 mL/min/{1.73_m2} (ref >59)

## 2021-07-13 LAB — LIPID PANEL
Chol/HDL Ratio: 2.9 ratio (ref 0.0–4.4)
Cholesterol, Total: 209 mg/dL — ABNORMAL HIGH (ref 100–199)
HDL: 71 mg/dL (ref >39)
LDL Chol Calc (NIH): 126 mg/dL — ABNORMAL HIGH (ref 0–99)
Triglycerides: 69 mg/dL (ref 0–149)
VLDL Cholesterol Cal: 12 mg/dL (ref 5–40)

## 2021-07-13 LAB — VITAMIN B12: Vitamin B-12: 461 pg/mL (ref 232–1245)

## 2021-07-13 LAB — TSH: TSH: 0.607 u[IU]/mL (ref 0.450–4.500)

## 2021-07-13 LAB — VITAMIN D 25 HYDROXY (VIT D DEFICIENCY, FRACTURES): Vit D, 25-Hydroxy: 28.5 ng/mL — ABNORMAL LOW (ref 30.0–100.0)

## 2021-08-04 ENCOUNTER — Other Ambulatory Visit: Payer: Self-pay

## 2021-09-09 DIAGNOSIS — M3500 Sicca syndrome, unspecified: Secondary | ICD-10-CM | POA: Diagnosis not present

## 2021-09-09 DIAGNOSIS — H353132 Nonexudative age-related macular degeneration, bilateral, intermediate dry stage: Secondary | ICD-10-CM | POA: Diagnosis not present

## 2021-09-09 DIAGNOSIS — H2513 Age-related nuclear cataract, bilateral: Secondary | ICD-10-CM | POA: Diagnosis not present

## 2021-09-28 ENCOUNTER — Other Ambulatory Visit: Payer: Self-pay

## 2021-09-29 ENCOUNTER — Other Ambulatory Visit: Payer: Self-pay

## 2021-09-29 MED ORDER — LIFITEGRAST 5 % OP SOLN
OPHTHALMIC | 6 refills | Status: DC
  Filled 2021-09-29: qty 60, 30d supply, fill #0
  Filled 2021-11-23: qty 60, 30d supply, fill #1
  Filled 2022-01-14: qty 60, 30d supply, fill #2
  Filled 2022-02-15: qty 60, 30d supply, fill #3
  Filled 2022-05-09 – 2022-05-26 (×2): qty 60, 30d supply, fill #4
  Filled 2022-07-12: qty 60, 30d supply, fill #5
  Filled 2022-09-27 – 2022-09-28 (×2): qty 60, 30d supply, fill #6

## 2021-09-29 MED ORDER — XIIDRA 5 % OP SOLN
OPHTHALMIC | 6 refills | Status: DC
  Filled 2021-09-29: qty 60, 30d supply, fill #0

## 2021-11-01 ENCOUNTER — Other Ambulatory Visit: Payer: Self-pay

## 2021-11-01 MED FILL — Fluticasone Propionate Nasal Susp 50 MCG/ACT: NASAL | 90 days supply | Qty: 48 | Fill #1 | Status: AC

## 2021-11-05 ENCOUNTER — Encounter: Payer: Self-pay | Admitting: Family Medicine

## 2021-11-08 ENCOUNTER — Other Ambulatory Visit: Payer: Self-pay

## 2021-11-08 ENCOUNTER — Ambulatory Visit (INDEPENDENT_AMBULATORY_CARE_PROVIDER_SITE_OTHER): Payer: 59 | Admitting: Dermatology

## 2021-11-08 DIAGNOSIS — B351 Tinea unguium: Secondary | ICD-10-CM | POA: Diagnosis not present

## 2021-11-08 DIAGNOSIS — L219 Seborrheic dermatitis, unspecified: Secondary | ICD-10-CM | POA: Diagnosis not present

## 2021-11-08 DIAGNOSIS — D229 Melanocytic nevi, unspecified: Secondary | ICD-10-CM

## 2021-11-08 DIAGNOSIS — L821 Other seborrheic keratosis: Secondary | ICD-10-CM

## 2021-11-08 DIAGNOSIS — Z1283 Encounter for screening for malignant neoplasm of skin: Secondary | ICD-10-CM | POA: Diagnosis not present

## 2021-11-08 DIAGNOSIS — L719 Rosacea, unspecified: Secondary | ICD-10-CM | POA: Diagnosis not present

## 2021-11-08 DIAGNOSIS — L720 Epidermal cyst: Secondary | ICD-10-CM | POA: Diagnosis not present

## 2021-11-08 DIAGNOSIS — L814 Other melanin hyperpigmentation: Secondary | ICD-10-CM

## 2021-11-08 DIAGNOSIS — L578 Other skin changes due to chronic exposure to nonionizing radiation: Secondary | ICD-10-CM | POA: Diagnosis not present

## 2021-11-08 DIAGNOSIS — D1800 Hemangioma unspecified site: Secondary | ICD-10-CM

## 2021-11-08 DIAGNOSIS — J309 Allergic rhinitis, unspecified: Secondary | ICD-10-CM

## 2021-11-08 MED ORDER — TAVABOROLE 5 % EX SOLN: CUTANEOUS | 5 refills | Status: DC

## 2021-11-08 MED ORDER — DOXYCYCLINE HYCLATE 80 MG PO TBEC: DELAYED_RELEASE_TABLET | ORAL | 2 refills | Status: DC

## 2021-11-08 MED ORDER — ZILXI 1.5 % EX FOAM: CUTANEOUS | 5 refills | Status: DC

## 2021-11-08 NOTE — Patient Instructions (Addendum)
Start Doryx 80 mg 1/2 tablet daily with food.  Start Zilxi once daily at bedtime.   Doxycycline should be taken with food to prevent nausea. Do not lay down for 30 minutes after taking. Be cautious with sun exposure and use good sun protection while on this medication. Pregnant women should not take this medication.   Rosacea  What is rosacea? Rosacea (say: ro-zay-sha) is a common skin disease that usually begins as a trend of flushing or blushing easily.  As rosacea progresses, a persistent redness in the center of the face will develop and may gradually spread beyond the nose and cheeks to the forehead and chin.  In some cases, the ears, chest, and back could be affected.  Rosacea may appear as tiny blood vessels or small red bumps that occur in crops.  Frequently they can contain pus, and are called "pustules".  If the bumps do not contain pus, they are referred to as "papules".  Rarely, in prolonged, untreated cases of rosacea, the oil glands of the nose and cheeks may become permanently enlarged.  This is called rhinophyma, and is seen more frequently in men.  Signs and Risks In its beginning stages, rosacea tends to come and go, which makes it difficult to recognize.  It can start as intermittent flushing of the face.  Eventually, blood vessels may become permanently visible.  Pustules and papules can appear, but can be mistaken for adult acne.  People of all races, ages, genders and ethnic groups are at risk of developing rosacea.  However, it is more common in women (especially around menopause) and adults with fair skin between the ages of 65 and 26.  Treatment Dermatologists typically recommend a combination of treatments to effectively manage rosacea.  Treatment can improve symptoms and may stop the progression of the rosacea.  Treatment may involve both topical and oral medications.  The tetracycline antibiotics are often used for their anti-inflammatory effect; however, because of the  possibility of developing antibiotic resistance, they should not be used long term at full dose.  For dilated blood vessels the options include electrodessication (uses electric current through a small needle), laser treatment, and cosmetics to hide the redness.   With all forms of treatment, improvement is a slow process, and patients may not see any results for the first 3-4 weeks.  It is very important to avoid the sun and other triggers.  Patients must wear sunscreen daily.  Skin Care Instructions: Cleanse the skin with a mild soap such as CeraVe cleanser, Cetaphil cleanser, or Dove soap once or twice daily as needed. Moisturize with Eucerin Redness Relief Daily Perfecting Lotion (has a subtle green tint), CeraVe Moisturizing Cream, or Oil of Olay Daily Moisturizer with sunscreen every morning and/or night as recommended. Makeup should be "non-comedogenic" (won't clog pores) and be labeled "for sensitive skin". Good choices for cosmetics are: Neutrogena, Almay, and Physician's Formula.  Any product with a green tint tends to offset a red complexion. If your eyes are dry and irritated, use artificial tears 2-3 times per day and cleanse the eyelids daily with baby shampoo.  Have your eyes examined at least every 2 years.  Be sure to tell your eye doctor that you have rosacea. Alcoholic beverages tend to cause flushing of the skin, and may make rosacea worse. Always wear sunscreen, protect your skin from extreme hot and cold temperatures, and avoid spicy foods, hot drinks, and mechanical irritation such as rubbing, scrubbing, or massaging the face.  Avoid harsh  skin cleansers, cleansing masks, astringents, and exfoliation. If a particular product burns or makes your face feel tight, then it is likely to flare your rosacea. If you are having difficulty finding a sunscreen that you can tolerate, you may try switching to a chemical-free sunscreen.  These are ones whose active ingredient is zinc oxide or  titanium dioxide only.  They should also be fragrance free, non-comedogenic, and labeled for sensitive skin. Rosacea triggers may vary from person to person.  There are a variety of foods that have been reported to trigger rosacea.  Some patients find that keeping a diary of what they were doing when they flared helps them avoid triggers.  Melanoma ABCDEs  Melanoma is the most dangerous type of skin cancer, and is the leading cause of death from skin disease.  You are more likely to develop melanoma if you: Have light-colored skin, light-colored eyes, or red or blond hair Spend a lot of time in the sun Tan regularly, either outdoors or in a tanning bed Have had blistering sunburns, especially during childhood Have a close family member who has had a melanoma Have atypical moles or large birthmarks  Early detection of melanoma is key since treatment is typically straightforward and cure rates are extremely high if we catch it early.   The first sign of melanoma is often a change in a mole or a new dark spot.  The ABCDE system is a way of remembering the signs of melanoma.  A for asymmetry:  The two halves do not match. B for border:  The edges of the growth are irregular. C for color:  A mixture of colors are present instead of an even brown color. D for diameter:  Melanomas are usually (but not always) greater than 10m - the size of a pencil eraser. E for evolution:  The spot keeps changing in size, shape, and color.  Please check your skin once per month between visits. You can use a small mirror in front and a large mirror behind you to keep an eye on the back side or your body.   If you see any new or changing lesions before your next follow-up, please call to schedule a visit.  Please continue daily skin protection including broad spectrum sunscreen SPF 30+ to sun-exposed areas, reapplying every 2 hours as needed when you're outdoors.    Due to recent changes in healthcare laws, you  may see results of your pathology and/or laboratory studies on MyChart before the doctors have had a chance to review them. We understand that in some cases there may be results that are confusing or concerning to you. Please understand that not all results are received at the same time and often the doctors may need to interpret multiple results in order to provide you with the best plan of care or course of treatment. Therefore, we ask that you please give u[place]2 business days to thoroughly review all your results before contacting the office for clarification. Should we see a critical lab result, you will be contacted sooner.   If You Need Anything After Your Visit  If you have any questions or concerns for your doctor, please call our main line at [phone] press option 4 to reach your doctor's medical assistant. If no one answers, please leave a voicemail as directed and we will return your call as soon as possible. Messages left after 4 pm will be answered the following business day.   You may also  send [place] a message via MyChart. We typically respond to MyChart messages within 1-2 business days.  For prescription refills, please ask your pharmacy to contact our office. Our fax number is [phone].  If you have an urgent issue when the clinic is closed that cannot wait until the next business day, you can page your doctor at the number below.    Please note that while we do our best to be available for urgent issues outside of office hours, we are not available 24/7.   If you have an urgent issue and are unable to reach [place], you may choose to seek medical care at your doctor's office, retail clinic, urgent care center, or emergency room.  If you have a medical emergency, please immediately call 911 or go to the emergency department.  Pager Numbers  - Dr. Nehemiah Massed: [phone]  - Dr. Laurence Ferrari: [phone]  - Dr. Nicole Kindred: [phone]  In the event of inclement weather, please call  our main line at [phone] for an update on the status of any delays or closures.  Dermatology Medication Tips: Please keep the boxes that topical medications come in in order to help keep track of the instructions about where and how to use these. Pharmacies typically print the medication instructions only on the boxes and not directly on the medication tubes.   If your medication is too expensive, please contact our office at [phone] option 4 or send [place] a message through [place].   We are unable to tell what your co-pay for medications will be in advance as this is different depending on your insurance coverage. However, we may be able to find a substitute medication at lower cost or fill out paperwork to get insurance to cover a needed medication.   If a prior authorization is required to get your medication covered by your insurance company, please allow [place] 1-2 business days to complete this process.  Drug prices often vary depending on where the prescription is filled and some pharmacies may offer cheaper prices.  The website www.goodrx.com contains coupons for medications through different pharmacies. The prices here do not account for what the cost may be with help from insurance (it may be cheaper with your insurance), but the website can give you the price if you did not use any insurance.  - You can print the associated coupon and take it with your prescription to the pharmacy.  - You may also stop by our office during regular business hours and pick up a GoodRx coupon card.  - If you need your prescription sent electronically to a different pharmacy, notify our office through [facility] or by phone at [phone] option 4.     Si Usted Necesita Algo Despus de Su Visita  Tambin puede enviarnos un mensaje a travs de Pharmacist, community. Por lo general respondemos a los mensajes de MyChart en el transcurso de 1 a 2 das hbiles.  Para renovar recetas, por favor pida a su  farmacia que se ponga en contacto con nuestra oficina. Harland Dingwall de fax es [place] [phone].  Si tiene un asunto urgente cuando la clnica est cerrada y que no puede esperar hasta el siguiente da hbil, puede llamar/localizar a su doctor(a) al nmero que aparece a continuacin.   Por favor, tenga en cuenta que aunque hacemos todo lo posible para estar disponibles para asuntos urgentes fuera del horario de [place], no estamos disponibles las 24 horas del da, los 7 das de la [place].   Si tiene  un problema urgente y no puede comunicarse con nosotros, puede optar por buscar atencin mdica  en el consultorio de su doctor(a), en una clnica privada, en un centro de atencin urgente o en una sala de emergencias.  Si tiene Engineering geologist, por favor llame inmediatamente al 911 o vaya a la sala de emergencias.  Nmeros de bper  - Dr. Nehemiah Massed: [phone]  - Dra. Moye: [phone]  - Dra. Nicole Kindred: [phone]  En caso de inclemencias del [place], por favor llame a Johnsie Kindred principal al [phone] para una actualizacin sobre el [place] de cualquier retraso o cierre.  Consejos para la medicacin en dermatologa: Por favor, guarde las cajas en las que vienen los medicamentos de uso tpico para ayudarle a seguir las instrucciones sobre dnde y cmo usarlos. Las farmacias generalmente imprimen las instrucciones del medicamento slo en las cajas y no directamente en los tubos del [place].   Si su medicamento es muy caro, por favor, pngase en contacto con Zigmund Daniel llamando al [phone] y presione la opcin 4 o envenos un mensaje a travs de Pharmacist, community.   No podemos decirle cul ser su copago por los medicamentos por adelantado ya que esto es diferente dependiendo de la cobertura de su seguro. Sin embargo, es posible que podamos encontrar un medicamento sustituto a Electrical engineer un formulario para que el seguro cubra el medicamento que se considera necesario.    Si se requiere una autorizacin previa para que su compaa de seguros [place] su medicamento, por favor permtanos de 1 a 2 das hbiles para completar este proceso.  Los precios de los medicamentos varan con frecuencia dependiendo del Environmental consultant de dnde se surte la receta y alguna farmacias pueden ofrecer precios ms baratos.  El sitio web www.goodrx.com tiene cupones para medicamentos de Airline pilot. Los precios aqu no tienen en cuenta lo que podra costar con la ayuda del seguro (puede ser ms barato con su seguro), pero el sitio web puede darle el precio si no utiliz Research scientist (physical sciences).  - Puede imprimir el cupn correspondiente y llevarlo con su receta a la farmacia.  - Tambin puede pasar por nuestra oficina durante el horario de atencin regular y Charity fundraiser una tarjeta de cupones de GoodRx.  - Si necesita que su receta se enve electrnicamente a una farmacia diferente, informe a nuestra oficina a travs de MyChart de [place] o por telfono llamando al [phone] y presione la opcin 4.

## 2021-11-08 NOTE — Progress Notes (Signed)
Follow-Up Visit   Subjective  Ashley Contreras is a 61 y.o. female who presents for the following: Annual Exam (The patient presents for Total-Body Skin Exam (TBSE) for skin cancer screening and mole check.  The patient has spots, moles and lesions to be evaluated, some may be new or changing and the patient has concerns that these could be cancer./) and Rosacea (Using metronidazole 0.75 % once daily. Unable to tolerate doxycycline 40 mg due to stomach upset. ).   The following portions of the chart were reviewed this encounter and updated as appropriate:       Review of Systems:  No other skin or systemic complaints except as noted in HPI or Assessment and Plan.  Objective  Well appearing patient in no apparent distress; mood and affect are within normal limits.  A full examination was performed including scalp, head, eyes, ears, nose, lips, neck, chest, axillae, abdomen, back, buttocks, bilateral upper extremities, bilateral lower extremities, hands, feet, fingers, toes, fingernails, and toenails. All findings within normal limits unless otherwise noted below.   face Mild erythema at nasal tip, malar cheeks  right lower cheek Subcutaneous nodule.   Right Upper Arm 7 x 5 mm waxy tan macule speckled darker inferior  right great toenail White discoloration at right great toenail, left great toenail clear  Scalp Mild erythema and scale occipital scalp    Assessment & Plan  Rosacea face  Chronic and persistent condition with duration or expected duration over one year. Condition is symptomatic/ bothersome to patient. Not currently at goal.   Rosacea is a chronic progressive skin condition usually affecting the face of adults, causing redness and/or acne bumps. It is treatable but not curable. It sometimes affects the eyes (ocular rosacea) as well. It may respond to topical and/or systemic medication and can flare with stress, sun exposure, alcohol, exercise and some foods.   Daily application of broad spectrum spf 30+ sunscreen to face is recommended to reduce flares.  Start Doryx 80 mg 1/2 tablet daily with food.  Start Zilxi foam once daily at bedtime.   Discussed treating with Soolantra, azelaic acid, combination cream from [facility].   Doxycycline Hyclate (DORYX) 80 MG TBEC - face Take with food  Minocycline HCl Micronized (ZILXI) 1.5 % FOAM - face Apply thin layer to affected areas at face once daily at bedtime.  Epidermal inclusion cyst right lower cheek  Benign-appearing. Exam most consistent with an epidermal inclusion cyst. Discussed that a cyst is a benign growth that can grow over time and sometimes get irritated or inflamed. Recommend observation if it is not bothersome. Discussed option of surgical excision to remove it if it is growing, symptomatic, or other changes noted. Please call for new or changing lesions so they can be evaluated.    Seborrheic keratosis Right Upper Arm  Reassured benign age-related growth.  Recommend observation.  Discussed cryotherapy if spot(s) become irritated or inflamed.  Stable, unchanged when compared to photo  Tinea unguium right great toenail  Continue Kerydin nightly to affected toenails.   Tavaborole (KERYDIN) 5 % SOLN - right great toenail Apply to affected toenail nightly until improved.  Seborrheic dermatitis Scalp  Mild  Seborrheic Dermatitis  -  is a chronic persistent rash characterized by pinkness and scaling most commonly of the mid face but also can occur on the scalp (dandruff), ears; mid chest, mid back and groin.  It tends to be exacerbated by stress and cooler weather.  People who have neurologic disease  may experience new onset or exacerbation of existing seborrheic dermatitis.  The condition is not curable but treatable and can be controlled.  Recommend OTC Head & Shoulders shampoo 2-3x per week, massage into scalp and let sit 3-5 minutes before rinsing.   Lentigines -  Scattered tan macules - Due to sun exposure - Benign-appearing, observe - Recommend daily broad spectrum sunscreen SPF 30+ to sun-exposed areas, reapply every 2 hours as needed. - Call for any changes  Seborrheic Keratoses - Stuck-on, waxy, tan-brown papules and/or plaques  - Benign-appearing - Discussed benign etiology and prognosis. - Observe - Call for any changes  Melanocytic Nevi - Tan-brown and/or pink-flesh-colored symmetric macules and papules - Benign appearing on exam today - Observation - Call clinic for new or changing moles - Recommend daily use of broad spectrum spf 30+ sunscreen to sun-exposed areas.   Hemangiomas - Red papules - Discussed benign nature - Observe - Call for any changes  Actinic Damage - Chronic condition, secondary to cumulative UV/sun exposure - diffuse scaly erythematous macules with underlying dyspigmentation - Recommend daily broad spectrum sunscreen SPF 30+ to sun-exposed areas, reapply every 2 hours as needed.  - Staying in the shade or wearing long sleeves, sun glasses (UVA+UVB protection) and wide brim hats (4-inch brim around the entire circumference of the hat) are also recommended for sun protection.  - Call for new or changing lesions.  Skin cancer screening performed today.  Return in about 1 year (around 11/09/2022) for TBSE, Rosacea.  Graciella Belton, RMA, am acting as scribe for Brendolyn Patty, MD .  Documentation: I have reviewed the above documentation for accuracy and completeness, and I agree with the above.  Brendolyn Patty MD

## 2021-11-11 ENCOUNTER — Other Ambulatory Visit: Payer: Self-pay | Admitting: Family Medicine

## 2021-11-11 ENCOUNTER — Other Ambulatory Visit: Payer: Self-pay

## 2021-11-11 DIAGNOSIS — J309 Allergic rhinitis, unspecified: Secondary | ICD-10-CM

## 2021-11-11 MED ORDER — SUCRALFATE 1 G PO TABS
1.0000 g | ORAL_TABLET | Freq: Three times a day (TID) | ORAL | 3 refills | Status: DC
  Filled 2021-11-11: qty 270, 68d supply, fill #0

## 2021-11-11 MED ORDER — FLUTICASONE PROPIONATE 50 MCG/ACT NA SUSP
2.0000 | Freq: Every day | NASAL | 1 refills | Status: DC
  Filled 2021-11-11 – 2022-09-28 (×3): qty 48, 90d supply, fill #0

## 2021-11-12 ENCOUNTER — Other Ambulatory Visit: Payer: Self-pay

## 2021-11-24 ENCOUNTER — Other Ambulatory Visit: Payer: Self-pay

## 2021-11-25 ENCOUNTER — Ambulatory Visit
Admission: RE | Admit: 2021-11-25 | Discharge: 2021-11-25 | Disposition: A | Discharge status: Home / Self Care | Payer: 59 | Source: Ambulatory Visit | Attending: Urgent Care | Admitting: Urgent Care

## 2021-11-25 VITALS — BP 99/59 | HR 68 | Temp 97.8°F | Resp 16

## 2021-11-25 DIAGNOSIS — R300 Dysuria: Secondary | ICD-10-CM | POA: Insufficient documentation

## 2021-11-25 DIAGNOSIS — N898 Other specified noninflammatory disorders of vagina: Secondary | ICD-10-CM | POA: Insufficient documentation

## 2021-11-25 LAB — POCT URINALYSIS DIP (MANUAL ENTRY)
Bilirubin, UA: NEGATIVE
Glucose, UA: NEGATIVE mg/dL
Ketones, POC UA: NEGATIVE mg/dL
Leukocytes, UA: NEGATIVE
Nitrite, UA: NEGATIVE
Protein Ur, POC: NEGATIVE mg/dL
Spec Grav, UA: 1.025 (ref 1.010–1.025)
Urobilinogen, UA: 0.2 E.U./dL
pH, UA: 5.5 (ref 5.0–8.0)

## 2021-11-25 MED ORDER — NITROFURANTOIN MONOHYD MACRO 100 MG PO CAPS
100.0000 mg | ORAL_CAPSULE | Freq: Two times a day (BID) | ORAL | 0 refills | Status: DC
  Filled 2021-11-25: qty 10, 5d supply, fill #0

## 2021-11-25 MED ORDER — FLUCONAZOLE 150 MG PO TABS
150.0000 mg | ORAL_TABLET | Freq: Once | ORAL | 0 refills | Status: AC
  Filled 2021-11-25: qty 2, 2d supply, fill #0

## 2021-11-25 NOTE — ED Triage Notes (Signed)
Patient presents to UC for urinary freq, lower back pain, vaginal itching, and dysuria since 4 days. Hx of BV and UTI.   Denies hematuria or fever.

## 2021-11-25 NOTE — ED Provider Notes (Signed)
Roderic Palau    CSN: [id number] Arrival date & time: 11/25/21  1826      History   Chief Complaint Chief Complaint  Patient presents with   Urinary Frequency    Have burning and itching with lower back pain when urinating. - Entered by patient   Dysuria   Vaginal Itching    HPI Ashley Contreras is a 61 y.o. female.    Urinary Frequency  Dysuria Vaginal Itching    Presents to urgent care with complaint of urinary frequency, lower back pain, vaginal itching, painful urination x4 days.  Patient endorses history of BV and UTI.  She denies hematuria.  Denies fever.  Past Medical History:  Diagnosis Date   Abnormal Pap smear 1989   s/p colposcopy   Allergy    hay fever   Arthritis    Asthma    BRCA negative 10/2018   MyRisk neg except ATM VUS   Chicken pox    Colon polyp    Complication of anesthesia    Family history of adverse reaction to anesthesia    sister - slow to wake   Family history of breast cancer    Fatty liver    seen by hepatology   GERD (gastroesophageal reflux disease)    Increased risk of breast cancer 10/2018   IBIS=24.7%/riskscore=15.8%   Mitral valve prolapse    PONV (postoperative nausea and vomiting)    Shingles    waist, right side   UTI (lower urinary tract infection)    Wears contact lenses     Patient Active Problem List   Diagnosis Date Noted   Vasomotor symptoms due to menopause 12/21/2020   COVID-19 08/28/2020   Cough 08/28/2020   Nasal sinus congestion 08/28/2020   Sore throat (viral) 08/28/2020   Encounter for fitting and adjustment of other gastrointestinal appliance and device    Abnormal liver enzymes    Idiopathic acute pancreatitis without infection or necrosis    Jaundice    Elevated liver enzymes    Acute gallstone pancreatitis 05/16/2020   Increased risk of breast cancer 11/11/2019   External hemorrhoid 11/11/2019   Family history of breast cancer 11/06/2018   Chronic nonintractable headache  10/31/2017   Special screening for malignant neoplasms, colon    Fatty liver 04/29/2016   Abdominal pain, chronic, right lower quadrant 01/29/2016   Palpitations 01/29/2016   Subclinical hyperthyroidism 04/24/2015   Encounter for general adult medical examination with abnormal findings 11/02/2012   Personal history of colonic polyps 10/29/2012   GERD (gastroesophageal reflux disease) 09/21/2012   Chronic gastritis 09/21/2012   Dyspareunia 04/25/2012   Cervical radiculopathy 11/03/2011   Seasonal allergies 10/21/2011    Past Surgical History:  Procedure Laterality Date   ABDOMINAL HYSTERECTOMY  2008   BREAST BIOPSY Left    benign   calcaneous osteotomy  06/2011   COLONOSCOPY WITH PROPOFOL N/A 02/17/2017   Procedure: COLONOSCOPY WITH PROPOFOL;  Surgeon: Lucilla Lame, MD;  Location: [place];  Service: Endoscopy;  Laterality: N/A;   ENDOSCOPIC RETROGRADE CHOLANGIOPANCREATOGRAPHY (ERCP) WITH PROPOFOL N/A 05/17/2020   Procedure: ENDOSCOPIC RETROGRADE CHOLANGIOPANCREATOGRAPHY (ERCP) WITH PROPOFOL;  Surgeon: Lucilla Lame, MD;  Location: [facility] ENDOSCOPY;  Service: Endoscopy;  Laterality: N/A;   ERCP N/A 06/18/2020   Procedure: ENDOSCOPIC RETROGRADE CHOLANGIOPANCREATOGRAPHY (ERCP);  Surgeon: Lucilla Lame, MD;  Location: [facility] ENDOSCOPY;  Service: Endoscopy;  Laterality: N/A;   ESOPHAGOGASTRODUODENOSCOPY (EGD) WITH PROPOFOL N/A 02/17/2017   Procedure: ESOPHAGOGASTRODUODENOSCOPY (EGD) WITH PROPOFOL;  Surgeon: Lucilla Lame, MD;  Location: [place];  Service: Endoscopy;  Laterality: N/A;   foot and ankle surgery     OOPHORECTOMY     TONSILLECTOMY AND ADENOIDECTOMY  2008    OB History     Gravida  2   Para  2   Term  2   Preterm      AB      Living  2      SAB      IAB      Ectopic      Multiple      Live Births  2            Home Medications    Prior to Admission medications   Medication Sig Start Date End Date Taking? Authorizing Provider   acetaminophen (TYLENOL) 500 MG tablet Take 500 mg by mouth every 4 (four) hours as needed.    [provider]  calcium-vitamin D (OSCAL WITH D) 500-200 MG-UNIT TABS tablet Take by mouth.    [provider]  Doxycycline Hyclate (DORYX) 80 MG TBEC Take with food 11/08/21   Brendolyn Patty, MD  estradiol (VIVELLE-DOT) 0.0375 MG/24HR PLACE 1 PATCH ONTO THE SKIN 2 TIMES A WEEK. 12/21/20 52/0/80  Copland, Elmo Putt B, PA-C  Fexofenadine HCl (ALLEGRA PO) Take by mouth.    [provider]  fluticasone (FLONASE) 50 MCG/ACT nasal spray Place 2 sprays into both nostrils daily. 11/11/21   Jerrol Banana., MD  Lifitegrast [facility]) 5 % SOLN PLACE 1 DROP INTO BOTH EYES TWICE A DAY 09/29/21 09/29/22    Lifitegrast (XIIDRA) 5 % SOLN Instill 1 drop into both eyes twice a day PLACE 1 DROP INTO BOTH EYES TWICE A DAY 09/29/21     Minocycline HCl Micronized (ZILXI) 1.5 % FOAM Apply thin layer to affected areas at face once daily at bedtime. 11/08/21   Brendolyn Patty, MD  Multiple Vitamins-Minerals (PRESERVISION AREDS 2 PO) Take by mouth 2 (two) times daily.    [provider]  mupirocin ointment (BACTROBAN) 2 % Apply 1 application topically 2 (two) times daily. Apply inside both nares for 1 week 11/03/20   Brendolyn Patty, MD  pantoprazole (PROTONIX) 40 MG tablet Take 1 tablet (40 mg total) by mouth 2 (two) times daily. 07/09/20   Jerrol Banana., MD  sucralfate (CARAFATE) 1 g tablet Take 1 tablet (1 g total) by mouth 4 (four) times daily -  with meals and at bedtime. 11/11/21   Jerrol Banana., MD  Tavaborole [facility]) 5 % SOLN Apply to affected toenail nightly until improved. 11/08/21   Brendolyn Patty, MD    Family History Family History  Problem Relation Age of Onset   Arthritis Mother    Hypertension Mother    Mental illness Mother    Diabetes Mother    Breast cancer Mother 44       right breast   Hyperlipidemia Father    Heart disease Father    Cancer Father         lung   Mental illness Sister    Myasthenia gravis Sister    Multiple sclerosis Sister    Stroke Maternal Aunt    Breast cancer Maternal Aunt 70       right breast   Breast cancer Maternal Aunt 72       right breast   Stroke Maternal Grandmother    Hypertension Maternal Grandmother    Diabetes Maternal Grandmother    Breast cancer Other  Multiple sclerosis Other     Social History Social History   Tobacco Use   Smoking status: Never   Smokeless tobacco: Never  Vaping Use   Vaping Use: Never used  Substance Use Topics   Alcohol use: No   Drug use: No     Allergies   Penicillins and Cinnamon   Review of Systems Review of Systems   Physical Exam Triage Vital Signs ED Triage Vitals  Enc Vitals Group     BP 11/25/21 1844 (!) 99/59     Pulse Rate 11/25/21 1844 68     Resp 11/25/21 1844 16     Temp 11/25/21 1844 97.8 F (36.6 C)     Temp Source 11/25/21 1844 Oral     SpO2 11/25/21 1844 98 %     Weight --      Height --      Head Circumference --      Peak Flow --      Pain Score 11/25/21 1843 0     Pain Loc --      Pain Edu? --      Excl. in [place]? --    No data found.  Updated Vital Signs BP (!) 99/59 (BP Location: Left Arm)   Pulse 68   Temp 97.8 F (36.6 C) (Oral)   Resp 16   SpO2 98%   Visual Acuity Right Eye Distance:   Left Eye Distance:   Bilateral Distance:    Right Eye Near:   Left Eye Near:    Bilateral Near:     Physical Exam Vitals reviewed.  Constitutional:      Appearance: Normal appearance.  Skin:    General: Skin is warm and dry.  Neurological:     General: No focal deficit present.     Mental Status: She is alert and oriented to person, place, and time.  Psychiatric:        Mood and Affect: Mood normal.        Behavior: Behavior normal.      UC Treatments / Results  Labs (all labs ordered are listed, but only abnormal results are displayed) Labs Reviewed  POCT URINALYSIS DIP (MANUAL ENTRY)     EKG   Radiology No results found.  Procedures Procedures (including critical care time)  Medications Ordered in UC Medications - No data to display  Initial Impression / Assessment and Plan / UC Course  I have reviewed the triage vital signs and the nursing notes.  Pertinent labs & imaging results that were available during my care of the patient were reviewed by me and considered in my medical decision making (see chart for details).   UA is negative for leuks and nitrites.  Given her symptoms I will treat for presumptive UTI with Macrobid while sending urine for culture to verify infection.  Patient is given instruction to stop antibiotic if culture is negative.  Vaginal swab is obtained to look for BV versus yeast.  Will prescribe fluconazole as patient states she frequently develops Candida infection with antibiotic.  Final Clinical Impressions(s) / UC Diagnoses   Final diagnoses:  Dysuria  Vaginal itching   Discharge Instructions   None    ED Prescriptions   None    PDMP not reviewed this encounter.   Rose Phi, [place] 11/25/21 1913

## 2021-11-25 NOTE — Discharge Instructions (Addendum)
Sample of your urine has been sent to the lab to confirm whether you have a UTI.  If the results are negative, please stop the prescribed antibiotic.  The results of your swab will be available in MyChart in a day or so.  If they are positive someone will call you to arrange appropriate treatment.  I would recommend that you continue the antibiotic prescribed for the presumed UTI until receiving a negative urine culture.     You have been prescribed fluconazole to treat vaginal yeast infection to be used in the event you develop an obvious infection while taking an antibiotic.  I have prescribed 2 tablets in the event 1 tablet does not treat the infection or it recurs after completing the antibiotic.  Follow up here or with your primary care provider if your symptoms are worsening or not improving with treatment.

## 2021-11-26 ENCOUNTER — Other Ambulatory Visit: Payer: Self-pay

## 2021-11-26 LAB — CERVICOVAGINAL ANCILLARY ONLY
Bacterial Vaginitis (gardnerella): NEGATIVE
Candida Glabrata: NEGATIVE
Candida Vaginitis: POSITIVE — AB
Comment: NEGATIVE
Comment: NEGATIVE
Comment: NEGATIVE

## 2021-11-26 LAB — URINE CULTURE: Culture: NO GROWTH

## 2021-12-10 ENCOUNTER — Other Ambulatory Visit: Payer: Self-pay

## 2021-12-10 DIAGNOSIS — H6983 Other specified disorders of Eustachian tube, bilateral: Secondary | ICD-10-CM | POA: Diagnosis not present

## 2021-12-10 MED ORDER — PREDNISONE 20 MG PO TABS
20.0000 mg | ORAL_TABLET | Freq: Two times a day (BID) | ORAL | 0 refills | Status: DC
  Filled 2021-12-10: qty 30, 15d supply, fill #0

## 2021-12-22 NOTE — Progress Notes (Unsigned)
PCP: Jerrol Banana., MD   No chief complaint on file.   HPI:      Ashley Contreras is a 61 y.o. No obstetric history on file. who LMP was No LMP recorded. Patient has had a hysterectomy., presents today for her annual examination.  Her menses are absent due to supracervical hyst BSO for menorrhagia/endometriosis 2008. No PMB. She rarely has vasomotor sx on vivelle-dot 0.0375 mg. Doing well and wants to continue.   Sex activity: single partner, contraception - status post hysterectomy. She does not have vaginal dryness.  Last Pap: 11/06/18  Results were: no abnormalities /neg HPV DNA. Still has cx. Hx of STDs: none  Has had issues with vaginal itching/burning for 3 days, no prior abx use. Was treated for BV with metrogel 2/22 with sx relief. No fishy odor today.  Last mammogram: 07/12/21  Results were: normal--routine follow-up in 12 months There is a FH of breast cancer in her mom and 2 mat aunts. Pt is MyRisk neg except ATM VUS 9/20. IBIS=24.7%/riskscore=15.8%. There is no FH of ovarian cancer. The patient does do self-breast exams. Has not had screening breast MRI yet but interested this year.  Colonoscopy: 12/18 without abnormalities;  Repeat due after 5 years due to hx of polyps in past. Pt has a hx of ext hemorrhoid, sometimes with flare. Doesn't need RF of analpram, currently. Prefers supp to cream but not on formulary. DEXA: normal DEXA in spine and hip at [facility] 2016.   Tobacco use: The patient denies current or previous tobacco use. Alcohol use: none  No drug use Exercise: moderately active  She does get adequate calcium and Vitamin D in her diet. Hx of Vit D deficiency.  Diagnosed with macular degeneration and rosacea   Past Medical History:  Diagnosis Date   Abnormal Pap smear 1989   s/p colposcopy   Allergy    hay fever   Arthritis    Asthma    BRCA negative 10/2018   MyRisk neg except ATM VUS   Chicken pox    Colon polyp    Complication of  anesthesia    Family history of adverse reaction to anesthesia    sister - slow to wake   Family history of breast cancer    Fatty liver    seen by hepatology   GERD (gastroesophageal reflux disease)    Increased risk of breast cancer 10/2018   IBIS=24.7%/riskscore=15.8%   Mitral valve prolapse    PONV (postoperative nausea and vomiting)    Shingles    waist, right side   UTI (lower urinary tract infection)    Wears contact lenses     Past Surgical History:  Procedure Laterality Date   ABDOMINAL HYSTERECTOMY  2008   BREAST BIOPSY Left    benign   calcaneous osteotomy  06/2011   COLONOSCOPY WITH PROPOFOL N/A 02/17/2017   Procedure: COLONOSCOPY WITH PROPOFOL;  Surgeon: Lucilla Lame, MD;  Location: [place];  Service: Endoscopy;  Laterality: N/A;   ENDOSCOPIC RETROGRADE CHOLANGIOPANCREATOGRAPHY (ERCP) WITH PROPOFOL N/A 05/17/2020   Procedure: ENDOSCOPIC RETROGRADE CHOLANGIOPANCREATOGRAPHY (ERCP) WITH PROPOFOL;  Surgeon: Lucilla Lame, MD;  Location: [facility] ENDOSCOPY;  Service: Endoscopy;  Laterality: N/A;   ERCP N/A 06/18/2020   Procedure: ENDOSCOPIC RETROGRADE CHOLANGIOPANCREATOGRAPHY (ERCP);  Surgeon: Lucilla Lame, MD;  Location: [facility] ENDOSCOPY;  Service: Endoscopy;  Laterality: N/A;   ESOPHAGOGASTRODUODENOSCOPY (EGD) WITH PROPOFOL N/A 02/17/2017   Procedure: ESOPHAGOGASTRODUODENOSCOPY (EGD) WITH PROPOFOL;  Surgeon: Lucilla Lame, MD;  Location: [facility];  Service: Endoscopy;  Laterality: N/A;   foot and ankle surgery     OOPHORECTOMY     TONSILLECTOMY AND ADENOIDECTOMY  2008    Family History  Problem Relation Age of Onset   Arthritis Mother    Hypertension Mother    Mental illness Mother    Diabetes Mother    Breast cancer Mother 22       right breast   Hyperlipidemia Father    Heart disease Father    Cancer Father        lung   Mental illness Sister    Myasthenia gravis Sister    Multiple sclerosis Sister    Stroke Maternal Aunt    Breast cancer  Maternal Aunt 70       right breast   Breast cancer Maternal Aunt 72       right breast   Stroke Maternal Grandmother    Hypertension Maternal Grandmother    Diabetes Maternal Grandmother    Breast cancer Other    Multiple sclerosis Other     Social History   Socioeconomic History   Marital status: Married    Spouse name: Not on file   Number of children: 2   Years of education: Not on file   Highest education level: Not on file  Occupational History   Occupation: Transport planner    Employer: [employer]  Tobacco Use   Smoking status: Never   Smokeless tobacco: Never  Vaping Use   Vaping Use: Never used  Substance and Sexual Activity   Alcohol use: No   Drug use: No   Sexual activity: Yes    Birth control/protection: Surgical    Comment: Hysterectomy  Other Topics Concern   Not on file  Social History Narrative   Lives in [place], 2 children. No pets.      Work - [employer]      Diet - regular, healthy   Exercise -bike sometimes, limited by injury   Social Determinants of Radio broadcast assistant Strain: Not on file  Food Insecurity: Not on file  Transportation Needs: Not on file  Physical Activity: Not on file  Stress: Not on file  Social Connections: Not on file  Intimate Partner Violence: Not on file     Current Outpatient Medications:    acetaminophen (TYLENOL) 500 MG tablet, Take 500 mg by mouth every 4 (four) hours as needed., Disp: , Rfl:    calcium-vitamin D (OSCAL WITH D) 500-200 MG-UNIT TABS tablet, Take by mouth., Disp: , Rfl:    Doxycycline Hyclate (DORYX) 80 MG TBEC, Take with food, Disp: 30 tablet, Rfl: 2   estradiol (VIVELLE-DOT) 0.0375 MG/24HR, PLACE 1 PATCH ONTO THE SKIN 2 TIMES A WEEK., Disp: 24 patch, Rfl: 3   Fexofenadine HCl (ALLEGRA PO), Take by mouth., Disp: , Rfl:    fluticasone (FLONASE) 50 MCG/ACT nasal spray, Place 2 sprays into both nostrils daily., Disp: 48 g, Rfl: 1   Lifitegrast (XIIDRA) 5 % SOLN, PLACE 1 DROP INTO BOTH  EYES TWICE A DAY, Disp: 60 each, Rfl: 6   Lifitegrast (XIIDRA) 5 % SOLN, Instill 1 drop into both eyes twice a day PLACE 1 DROP INTO BOTH EYES TWICE A DAY, Disp: 60 each, Rfl: 6   Minocycline HCl Micronized (ZILXI) 1.5 % FOAM, Apply thin layer to affected areas at face once daily at bedtime., Disp: 30 g, Rfl: 5   Multiple Vitamins-Minerals (PRESERVISION AREDS 2 PO), Take by mouth 2 (two)  times daily., Disp: , Rfl:    mupirocin ointment (BACTROBAN) 2 %, Apply 1 application topically 2 (two) times daily. Apply inside both nares for 1 week, Disp: 22 g, Rfl: 1   nitrofurantoin, macrocrystal-monohydrate, (MACROBID) 100 MG capsule, Take 1 capsule (100 mg total) by mouth 2 (two) times daily., Disp: 10 capsule, Rfl: 0   pantoprazole (PROTONIX) 40 MG tablet, Take 1 tablet (40 mg total) by mouth 2 (two) times daily., Disp: 180 tablet, Rfl: 1   predniSONE (DELTASONE) 20 MG tablet, Take 1 tablet (20 mg total) by mouth 2 (two) times daily as directed., Disp: 30 tablet, Rfl: 0   sucralfate (CARAFATE) 1 g tablet, Take 1 tablet (1 g total) by mouth 4 (four) times daily -  with meals and at bedtime., Disp: 270 tablet, Rfl: 3   Tavaborole (KERYDIN) 5 % SOLN, Apply to affected toenail nightly until improved., Disp: 10 mL, Rfl: 5     ROS:  Review of Systems  Constitutional:  Negative for fatigue, fever and unexpected weight change.  Respiratory:  Negative for cough, shortness of breath and wheezing.   Cardiovascular:  Negative for chest pain, palpitations and leg swelling.  Gastrointestinal:  Negative for blood in stool, constipation, diarrhea, nausea and vomiting.  Endocrine: Negative for cold intolerance, heat intolerance and polyuria.  Genitourinary:  Negative for dyspareunia, dysuria, flank pain, frequency, genital sores, hematuria, menstrual problem, pelvic pain, urgency, vaginal bleeding, vaginal discharge and vaginal pain.  Musculoskeletal:  Negative for back pain, joint swelling and myalgias.  Skin:   Negative for rash.  Neurological:  Negative for dizziness, syncope, light-headedness, numbness and headaches.  Hematological:  Negative for adenopathy.  Psychiatric/Behavioral:  Negative for agitation, confusion, sleep disturbance and suicidal ideas. The patient is not nervous/anxious.   BREAST: No symptoms    Objective: There were no vitals taken for this visit.   Physical Exam Constitutional:      Appearance: She is well-developed.  Genitourinary:     Vulva normal.     Genitourinary Comments: UTERUS SURG REM     Right Labia: No rash, tenderness or lesions.    Left Labia: No tenderness, lesions or rash.    No vaginal discharge, erythema or tenderness.      Right Adnexa: absent.    Right Adnexa: not tender and no mass present.    Left Adnexa: absent.    Left Adnexa: not tender and no mass present.    No cervical friability or polyp.     Uterus is not enlarged or tender.     Uterus is absent.  Rectum:     External hemorrhoid present.  Breasts:    Right: No mass, nipple discharge, skin change or tenderness.     Left: No mass, nipple discharge, skin change or tenderness.  Neck:     Thyroid: No thyromegaly.  Cardiovascular:     Rate and Rhythm: Normal rate and regular rhythm.     Heart sounds: Normal heart sounds. No murmur heard. Pulmonary:     Effort: Pulmonary effort is normal.     Breath sounds: Normal breath sounds.  Abdominal:     Palpations: Abdomen is soft.     Tenderness: There is no abdominal tenderness. There is no guarding or rebound.  Musculoskeletal:        General: Normal range of motion.     Cervical back: Normal range of motion.  Lymphadenopathy:     Cervical: No cervical adenopathy.  Neurological:     General: No focal deficit  present.     Mental Status: She is alert and oriented to person, place, and time.     Cranial Nerves: No cranial nerve deficit.  Skin:    General: Skin is warm and dry.  Psychiatric:        Mood and Affect: Mood normal.         Behavior: Behavior normal.        Thought Content: Thought content normal.        Judgment: Judgment normal.  Vitals reviewed.    No results found for this or any previous visit (from the past 24 hour(s)).     Assessment/Plan: Encounter for annual routine gynecological examination  Encounter for screening mammogram for malignant neoplasm of breast - Plan: MM 3D SCREEN BREAST BILATERAL; pt to sched mammo 5/23  Family history of breast cancer--Pt is MyRisk neg  Increased risk of breast cancer--pt aware of recommendations of monthly SBE, yearly CBE and mammos, and scr breast MRI. Will call after 5/23 mammo for MRI ref prn. Too late to do this yr. Cont Vit D supp  Hormone replacement therapy (HRT) - Plan: estradiol (VIVELLE-DOT) 0.0375 MG/24HR  Vasomotor symptoms due to menopause - Plan: estradiol (VIVELLE-DOT) 0.0375 MG/24HR; Rx RF. Doing well.   External hemorrhoid--will call for analpram RF prn  Vaginal itching - Plan: fluconazole (DIFLUCAN) 150 MG tablet; neg wet prep/exam, pos sx. Rx diflucan. F/u prn.    No orders of the defined types were placed in this encounter.          GYN counsel breast self exam, mammography screening, adequate intake of calcium and vitamin D, diet and exercise    F/U  No follow-ups on file.  Ashley Brass B. Pluma Diniz, PA-C 12/22/2021 8:36 PM

## 2021-12-23 ENCOUNTER — Encounter: Payer: Self-pay | Admitting: Obstetrics and Gynecology

## 2021-12-23 ENCOUNTER — Ambulatory Visit (INDEPENDENT_AMBULATORY_CARE_PROVIDER_SITE_OTHER): Payer: 59 | Admitting: Obstetrics and Gynecology

## 2021-12-23 ENCOUNTER — Other Ambulatory Visit: Payer: Self-pay

## 2021-12-23 ENCOUNTER — Other Ambulatory Visit (HOSPITAL_COMMUNITY)
Admission: RE | Admit: 2021-12-23 | Discharge: 2021-12-23 | Disposition: A | Discharge status: Home / Self Care | Payer: 59 | Source: Ambulatory Visit | Attending: Obstetrics and Gynecology | Admitting: Obstetrics and Gynecology

## 2021-12-23 VITALS — BP 100/64 | Ht 66.0 in | Wt 129.0 lb

## 2021-12-23 DIAGNOSIS — Z124 Encounter for screening for malignant neoplasm of cervix: Secondary | ICD-10-CM | POA: Insufficient documentation

## 2021-12-23 DIAGNOSIS — Z01419 Encounter for gynecological examination (general) (routine) without abnormal findings: Secondary | ICD-10-CM

## 2021-12-23 DIAGNOSIS — Z1151 Encounter for screening for human papillomavirus (HPV): Secondary | ICD-10-CM | POA: Insufficient documentation

## 2021-12-23 DIAGNOSIS — Z9189 Other specified personal risk factors, not elsewhere classified: Secondary | ICD-10-CM

## 2021-12-23 DIAGNOSIS — N951 Menopausal and female climacteric states: Secondary | ICD-10-CM

## 2021-12-23 DIAGNOSIS — Z79890 Hormone replacement therapy (postmenopausal): Secondary | ICD-10-CM

## 2021-12-23 DIAGNOSIS — Z803 Family history of malignant neoplasm of breast: Secondary | ICD-10-CM

## 2021-12-23 DIAGNOSIS — Z1231 Encounter for screening mammogram for malignant neoplasm of breast: Secondary | ICD-10-CM

## 2021-12-23 DIAGNOSIS — Z1211 Encounter for screening for malignant neoplasm of colon: Secondary | ICD-10-CM

## 2021-12-23 MED ORDER — ESTRADIOL 0.0375 MG/24HR TD PTTW
MEDICATED_PATCH | TRANSDERMAL | 3 refills | Status: DC
  Filled 2021-12-23: qty 24, 84d supply, fill #0
  Filled 2022-04-05 – 2022-04-11 (×2): qty 24, 84d supply, fill #1
  Filled 2022-07-12 (×2): qty 24, 84d supply, fill #2
  Filled 2022-09-27: qty 24, 84d supply, fill #3

## 2021-12-23 NOTE — Patient Instructions (Addendum)
I value your feedback and you entrusting us with your care. If you get a [facility] patient survey, I would appreciate you taking the time to let us know about your experience today. Thank you!  [facility]: [phone]      

## 2021-12-24 LAB — CYTOLOGY - PAP
Comment: NEGATIVE
Diagnosis: NEGATIVE
High risk HPV: NEGATIVE

## 2022-01-04 DIAGNOSIS — H6983 Other specified disorders of Eustachian tube, bilateral: Secondary | ICD-10-CM | POA: Diagnosis not present

## 2022-01-17 ENCOUNTER — Other Ambulatory Visit: Payer: Self-pay

## 2022-02-15 ENCOUNTER — Other Ambulatory Visit: Payer: Self-pay

## 2022-03-01 ENCOUNTER — Other Ambulatory Visit: Payer: Self-pay

## 2022-03-04 ENCOUNTER — Encounter (INDEPENDENT_AMBULATORY_CARE_PROVIDER_SITE_OTHER): Payer: 59 | Admitting: Ophthalmology

## 2022-03-04 DIAGNOSIS — H353132 Nonexudative age-related macular degeneration, bilateral, intermediate dry stage: Secondary | ICD-10-CM

## 2022-03-04 DIAGNOSIS — H43813 Vitreous degeneration, bilateral: Secondary | ICD-10-CM

## 2022-03-14 DIAGNOSIS — F900 Attention-deficit hyperactivity disorder, predominantly inattentive type: Secondary | ICD-10-CM | POA: Diagnosis not present

## 2022-04-05 ENCOUNTER — Other Ambulatory Visit: Payer: Self-pay

## 2022-04-11 ENCOUNTER — Other Ambulatory Visit: Payer: Self-pay

## 2022-04-22 ENCOUNTER — Other Ambulatory Visit: Payer: Self-pay

## 2022-04-22 DIAGNOSIS — M3500 Sicca syndrome, unspecified: Secondary | ICD-10-CM | POA: Diagnosis not present

## 2022-04-22 DIAGNOSIS — H353132 Nonexudative age-related macular degeneration, bilateral, intermediate dry stage: Secondary | ICD-10-CM | POA: Diagnosis not present

## 2022-04-22 DIAGNOSIS — H04123 Dry eye syndrome of bilateral lacrimal glands: Secondary | ICD-10-CM | POA: Diagnosis not present

## 2022-04-22 MED ORDER — MAXITROL 3.5-10000-0.1 OP OINT
TOPICAL_OINTMENT | Freq: Every evening | OPHTHALMIC | 2 refills | Status: DC
  Filled 2022-04-22: qty 3.5, 30d supply, fill #0

## 2022-05-09 ENCOUNTER — Other Ambulatory Visit: Payer: Self-pay

## 2022-05-10 ENCOUNTER — Other Ambulatory Visit: Payer: Self-pay

## 2022-05-27 ENCOUNTER — Other Ambulatory Visit: Payer: Self-pay

## 2022-07-12 ENCOUNTER — Other Ambulatory Visit: Payer: Self-pay

## 2022-07-14 ENCOUNTER — Encounter: Payer: 59 | Admitting: Family Medicine

## 2022-07-14 DIAGNOSIS — Z79899 Other long term (current) drug therapy: Secondary | ICD-10-CM | POA: Diagnosis not present

## 2022-07-14 DIAGNOSIS — M4135 Thoracogenic scoliosis, thoracolumbar region: Secondary | ICD-10-CM | POA: Diagnosis not present

## 2022-07-14 DIAGNOSIS — M5136 Other intervertebral disc degeneration, lumbar region: Secondary | ICD-10-CM | POA: Diagnosis not present

## 2022-07-14 DIAGNOSIS — E0590 Thyrotoxicosis, unspecified without thyrotoxic crisis or storm: Secondary | ICD-10-CM | POA: Diagnosis not present

## 2022-07-14 DIAGNOSIS — J302 Other seasonal allergic rhinitis: Secondary | ICD-10-CM | POA: Diagnosis not present

## 2022-07-14 DIAGNOSIS — Z0000 Encounter for general adult medical examination without abnormal findings: Secondary | ICD-10-CM | POA: Diagnosis not present

## 2022-07-14 DIAGNOSIS — M5441 Lumbago with sciatica, right side: Secondary | ICD-10-CM | POA: Diagnosis not present

## 2022-07-14 DIAGNOSIS — K219 Gastro-esophageal reflux disease without esophagitis: Secondary | ICD-10-CM | POA: Diagnosis not present

## 2022-07-14 DIAGNOSIS — G8929 Other chronic pain: Secondary | ICD-10-CM | POA: Diagnosis not present

## 2022-07-14 DIAGNOSIS — Z1389 Encounter for screening for other disorder: Secondary | ICD-10-CM | POA: Diagnosis not present

## 2022-07-14 DIAGNOSIS — K760 Fatty (change of) liver, not elsewhere classified: Secondary | ICD-10-CM | POA: Diagnosis not present

## 2022-07-21 ENCOUNTER — Telehealth: Payer: 59 | Admitting: Physician Assistant

## 2022-07-21 ENCOUNTER — Other Ambulatory Visit: Payer: Self-pay | Admitting: Family Medicine

## 2022-07-21 DIAGNOSIS — Z0000 Encounter for general adult medical examination without abnormal findings: Secondary | ICD-10-CM

## 2022-07-21 DIAGNOSIS — L282 Other prurigo: Secondary | ICD-10-CM

## 2022-07-21 DIAGNOSIS — Z79899 Other long term (current) drug therapy: Secondary | ICD-10-CM

## 2022-07-22 ENCOUNTER — Other Ambulatory Visit: Payer: Self-pay

## 2022-07-22 ENCOUNTER — Telehealth: Payer: 59

## 2022-07-22 MED ORDER — PREDNISONE 10 MG PO TABS
ORAL_TABLET | ORAL | 0 refills | Status: DC
  Filled 2022-07-22: qty 21, 6d supply, fill #0

## 2022-07-22 MED ORDER — TRIAMCINOLONE ACETONIDE 0.025 % EX CREA
1.0000 | TOPICAL_CREAM | Freq: Two times a day (BID) | CUTANEOUS | 0 refills | Status: AC
  Filled 2022-07-22: qty 30, 15d supply, fill #0

## 2022-07-22 NOTE — Addendum Note (Signed)
Addended by: Freddy Finner on: 07/22/2022 08:46 AM   Modules accepted: Orders

## 2022-07-22 NOTE — Progress Notes (Signed)
E Visit for Rash  We are sorry that you are not feeling well. Here is how we plan to help!  Based on what you shared with me you may have an allergic reaction.  It is hard to determine through just pictures but it appears like bug bites with a localized histamine (allergic) response.   Prednisone 10 mg daily for 6 days (see taper instructions below)  Directions for 6 day taper: Day 1: 2 tablets before breakfast, 1 after both lunch & dinner and 2 at bedtime Day 2: 1 tab before breakfast, 1 after both lunch & dinner and 2 at bedtime Day 3: 1 tab at each meal & 1 at bedtime Day 4: 1 tab at breakfast, 1 at lunch, 1 at bedtime Day 5: 1 tab at breakfast & 1 tab at bedtime Day 6: 1 tab at breakfast   HOME CARE:  Take cool showers and avoid direct sunlight. Apply cool compress or wet dressings. Take a bath in an oatmeal bath.  Sprinkle content of one Aveeno packet under running faucet with comfortably warm water.  Bathe for 15-20 minutes, 1-2 times daily.  Pat dry with a towel. Do not rub the rash. Use hydrocortisone cream. Take an antihistamine like Benadryl for widespread rashes that itch.  The adult dose of Benadryl is 25-50 mg by mouth 4 times daily. Caution:  This type of medication may cause sleepiness.  Do not drink alcohol, drive, or operate dangerous machinery while taking antihistamines.  Do not take these medications if you have prostate enlargement.  Read package instructions thoroughly on all medications that you take.  GET HELP RIGHT AWAY IF:  Symptoms don't go away after treatment. Severe itching that persists. If you rash spreads or swells. If you rash begins to smell. If it blisters and opens or develops a yellow-brown crust. You develop a fever. You have a sore throat. You become short of breath.  MAKE SURE YOU:  Understand these instructions. Will watch your condition. Will get help right away if you are not doing well or get worse.  Thank you for choosing an  e-visit.  Your e-visit answers were reviewed by a board certified advanced clinical practitioner to complete your personal care plan. Depending upon the condition, your plan could have included both over the counter or prescription medications.  Please review your pharmacy choice. Make sure the pharmacy is open so you can pick up prescription now. If there is a problem, you may contact your provider through [employer] and have the prescription routed to another pharmacy.  Your safety is important to [place]. If you have drug allergies check your prescription carefully.   For the next 24 hours you can use MyChart to ask questions about today's visit, request a non-urgent call back, or ask for a work or school excuse. You will get an email in the next two days asking about your experience. I hope that your e-visit has been valuable and will speed your recovery.   I have spent 5 minutes in review of e-visit questionnaire, review and updating patient chart, medical decision making and response to patient.   Margaretann Loveless, PA-C

## 2022-07-25 DIAGNOSIS — H353131 Nonexudative age-related macular degeneration, bilateral, early dry stage: Secondary | ICD-10-CM | POA: Diagnosis not present

## 2022-07-25 DIAGNOSIS — H524 Presbyopia: Secondary | ICD-10-CM | POA: Diagnosis not present

## 2022-07-29 ENCOUNTER — Ambulatory Visit
Admission: RE | Admit: 2022-07-29 | Discharge: 2022-07-29 | Disposition: A | Discharge status: Home / Self Care | Payer: 59 | Source: Ambulatory Visit | Attending: Obstetrics and Gynecology

## 2022-07-29 DIAGNOSIS — Z1231 Encounter for screening mammogram for malignant neoplasm of breast: Secondary | ICD-10-CM | POA: Diagnosis not present

## 2022-08-03 DIAGNOSIS — Z780 Asymptomatic menopausal state: Secondary | ICD-10-CM | POA: Diagnosis not present

## 2022-08-24 DIAGNOSIS — H43811 Vitreous degeneration, right eye: Secondary | ICD-10-CM | POA: Diagnosis not present

## 2022-08-24 DIAGNOSIS — H2513 Age-related nuclear cataract, bilateral: Secondary | ICD-10-CM | POA: Diagnosis not present

## 2022-08-24 DIAGNOSIS — H353132 Nonexudative age-related macular degeneration, bilateral, intermediate dry stage: Secondary | ICD-10-CM | POA: Diagnosis not present

## 2022-09-12 ENCOUNTER — Other Ambulatory Visit: Payer: Self-pay

## 2022-09-13 ENCOUNTER — Other Ambulatory Visit: Payer: Self-pay

## 2022-09-15 ENCOUNTER — Other Ambulatory Visit: Payer: Self-pay

## 2022-09-19 ENCOUNTER — Other Ambulatory Visit: Payer: Self-pay

## 2022-09-21 ENCOUNTER — Other Ambulatory Visit: Payer: Self-pay

## 2022-09-21 MED ORDER — PANTOPRAZOLE SODIUM 40 MG PO TBEC
40.0000 mg | DELAYED_RELEASE_TABLET | Freq: Every day | ORAL | 3 refills | Status: DC
  Filled 2022-09-21: qty 90, 90d supply, fill #0
  Filled 2023-02-20: qty 90, 90d supply, fill #1

## 2022-09-22 ENCOUNTER — Other Ambulatory Visit: Payer: Self-pay

## 2022-09-27 DIAGNOSIS — H2513 Age-related nuclear cataract, bilateral: Secondary | ICD-10-CM | POA: Diagnosis not present

## 2022-09-27 DIAGNOSIS — H04123 Dry eye syndrome of bilateral lacrimal glands: Secondary | ICD-10-CM | POA: Diagnosis not present

## 2022-09-27 DIAGNOSIS — H353132 Nonexudative age-related macular degeneration, bilateral, intermediate dry stage: Secondary | ICD-10-CM | POA: Diagnosis not present

## 2022-09-27 DIAGNOSIS — H43811 Vitreous degeneration, right eye: Secondary | ICD-10-CM | POA: Diagnosis not present

## 2022-09-28 ENCOUNTER — Other Ambulatory Visit: Payer: Self-pay

## 2022-11-01 ENCOUNTER — Other Ambulatory Visit: Payer: Self-pay

## 2022-11-01 ENCOUNTER — Encounter: Payer: Self-pay | Admitting: Dermatology

## 2022-11-01 ENCOUNTER — Ambulatory Visit (INDEPENDENT_AMBULATORY_CARE_PROVIDER_SITE_OTHER): Payer: 59 | Admitting: Dermatology

## 2022-11-01 VITALS — BP 104/65 | HR 73

## 2022-11-01 DIAGNOSIS — L578 Other skin changes due to chronic exposure to nonionizing radiation: Secondary | ICD-10-CM | POA: Diagnosis not present

## 2022-11-01 DIAGNOSIS — L719 Rosacea, unspecified: Secondary | ICD-10-CM

## 2022-11-01 DIAGNOSIS — L814 Other melanin hyperpigmentation: Secondary | ICD-10-CM

## 2022-11-01 DIAGNOSIS — L0100 Impetigo, unspecified: Secondary | ICD-10-CM | POA: Diagnosis not present

## 2022-11-01 DIAGNOSIS — B351 Tinea unguium: Secondary | ICD-10-CM

## 2022-11-01 DIAGNOSIS — L821 Other seborrheic keratosis: Secondary | ICD-10-CM | POA: Diagnosis not present

## 2022-11-01 DIAGNOSIS — Z1283 Encounter for screening for malignant neoplasm of skin: Secondary | ICD-10-CM | POA: Diagnosis not present

## 2022-11-01 DIAGNOSIS — L219 Seborrheic dermatitis, unspecified: Secondary | ICD-10-CM

## 2022-11-01 DIAGNOSIS — L237 Allergic contact dermatitis due to plants, except food: Secondary | ICD-10-CM

## 2022-11-01 DIAGNOSIS — D229 Melanocytic nevi, unspecified: Secondary | ICD-10-CM

## 2022-11-01 DIAGNOSIS — W908XXA Exposure to other nonionizing radiation, initial encounter: Secondary | ICD-10-CM | POA: Diagnosis not present

## 2022-11-01 DIAGNOSIS — D1801 Hemangioma of skin and subcutaneous tissue: Secondary | ICD-10-CM

## 2022-11-01 MED ORDER — MUPIROCIN 2 % EX OINT
TOPICAL_OINTMENT | CUTANEOUS | 0 refills | Status: DC
Start: 2022-11-01 — End: 2023-01-03
  Filled 2022-11-01: qty 22, 14d supply, fill #0

## 2022-11-01 MED ORDER — METRONIDAZOLE 0.75 % EX CREA
TOPICAL_CREAM | CUTANEOUS | 5 refills | Status: DC
Start: 2022-11-01 — End: 2023-11-07
  Filled 2022-11-01: qty 45, 30d supply, fill #0

## 2022-11-01 MED ORDER — TAVABOROLE 5 % EX SOLN: CUTANEOUS | 5 refills | Status: DC

## 2022-11-01 MED ORDER — MOMETASONE FUROATE 0.1 % EX OINT
TOPICAL_OINTMENT | CUTANEOUS | 0 refills | Status: DC
Start: 2022-11-01 — End: 2023-11-07
  Filled 2022-11-01: qty 45, 14d supply, fill #0

## 2022-11-01 NOTE — Patient Instructions (Addendum)
For toenails  Restart Kerydin solution nightly to affected toenails. If not responding after several treatments send mychart message or call.  Avoid nail polish to affected nails at this time.   For itchy rash at face and bug bites  Start mometasone ointment - apply twice daily to itchy rash for up to 2 weeks or until clear. Can also apply to itchy bug bites twice daily for a few days  Topical steroids (such as triamcinolone, fluocinolone, fluocinonide, mometasone, clobetasol, halobetasol, betamethasone, hydrocortisone) can cause thinning and lightening of the skin if they are used for too long in the same area. Your physician has selected the right strength medicine for your problem and area affected on the body. Please use your medication only as directed by your physician to prevent side effects.   For bump inside nose  Start mupirocin ointment  apply twice daily to affected area for 2 weeks. If not resolved recommend follow up with ENT.    Recommend OTC Head & Shoulders shampoo 2-3x per week, massage into scalp and let sit 3-5 minutes before rinsing.      Melanoma ABCDEs  Melanoma is the most dangerous type of skin cancer, and is the leading cause of death from skin disease.  You are more likely to develop melanoma if you: Have light-colored skin, light-colored eyes, or red or blond hair Spend a lot of time in the sun Tan regularly, either outdoors or in a tanning bed Have had blistering sunburns, especially during childhood Have a close family member who has had a melanoma Have atypical moles or large birthmarks  Early detection of melanoma is key since treatment is typically straightforward and cure rates are extremely high if we catch it early.   The first sign of melanoma is often a change in a mole or a new dark spot.  The ABCDE system is a way of remembering the signs of melanoma.  A for asymmetry:  The two halves do not match. B for border:  The edges of the growth are  irregular. C for color:  A mixture of colors are present instead of an even brown color. D for diameter:  Melanomas are usually (but not always) greater than 6mm - the size of a pencil eraser. E for evolution:  The spot keeps changing in size, shape, and color.  Please check your skin once per month between visits. You can use a small mirror in front and a large mirror behind you to keep an eye on the back side or your body.   If you see any new or changing lesions before your next follow-up, please call to schedule a visit.  Please continue daily skin protection including broad spectrum sunscreen SPF 30+ to sun-exposed areas, reapplying every 2 hours as needed when you're outdoors.   Staying in the shade or wearing long sleeves, sun glasses (UVA+UVB protection) and wide brim hats (4-inch brim around the entire circumference of the hat) are also recommended for sun protection.    Due to recent changes in healthcare laws, you may see results of your pathology and/or laboratory studies on MyChart before the doctors have had a chance to review them. We understand that in some cases there may be results that are confusing or concerning to you. Please understand that not all results are received at the same time and often the doctors may need to interpret multiple results in order to provide you with the best plan of care or course of treatment. Therefore, we  ask that you please give [place] 2 business days to thoroughly review all your results before contacting the office for clarification. Should we see a critical lab result, you will be contacted sooner.   If You Need Anything After Your Visit  If you have any questions or concerns for your doctor, please call our main line at [phone] and press option 4 to reach your doctor's medical assistant. If no one answers, please leave a voicemail as directed and we will return your call as soon as possible. Messages left after 4 pm will be answered the  following business day.   You may also send [place] a message via MyChart. We typically respond to MyChart messages within 1-2 business days.  For prescription refills, please ask your pharmacy to contact our office. Our fax number is [phone].  If you have an urgent issue when the clinic is closed that cannot wait until the next business day, you can page your doctor at the number below.    Please note that while we do our best to be available for urgent issues outside of office hours, we are not available 24/7.   If you have an urgent issue and are unable to reach [place], you may choose to seek medical care at your doctor's office, retail clinic, urgent care center, or emergency room.  If you have a medical emergency, please immediately call 911 or go to the emergency department.  Pager Numbers  - Dr. Gwen Pounds: [phone]  - Dr. Roseanne Reno: [phone]  - Dr. Katrinka Blazing: [phone]   In the event of inclement weather, please call our main line at [phone] for an update on the status of any delays or closures.  Dermatology Medication Tips: Please keep the boxes that topical medications come in in order to help keep track of the instructions about where and how to use these. Pharmacies typically print the medication instructions only on the boxes and not directly on the medication tubes.   If your medication is too expensive, please contact our office at [phone] option 4 or send [place] a message through MyChart.   We are unable to tell what your co-pay for medications will be in advance as this is different depending on your insurance coverage. However, we may be able to find a substitute medication at lower cost or fill out paperwork to get insurance to cover a needed medication.   If a prior authorization is required to get your medication covered by your insurance company, please allow [place] 1-2 business days to complete this process.  Drug prices often vary depending on where the  prescription is filled and some pharmacies may offer cheaper prices.  The website www.goodrx.com contains coupons for medications through different pharmacies. The prices here do not account for what the cost may be with help from insurance (it may be cheaper with your insurance), but the website can give you the price if you did not use any insurance.  - You can print the associated coupon and take it with your prescription to the pharmacy.  - You may also stop by our office during regular business hours and pick up a GoodRx coupon card.  - If you need your prescription sent electronically to a different pharmacy, notify our office through [facility] or by phone at [phone] option 4.     Si Usted Necesita Algo Despus de Su Visita  Tambin puede enviarnos un mensaje a travs de Clinical cytogeneticist. Por lo general respondemos a los mensajes de [employer]  en el transcurso de 1 a 2 das hbiles.  Para renovar recetas, por favor pida a su farmacia que se ponga en contacto con nuestra oficina. Annie Sable de fax es [place] [phone].  Si tiene un asunto urgente cuando la clnica est cerrada y que no puede esperar hasta el siguiente da hbil, puede llamar/localizar a su doctor(a) al nmero que aparece a continuacin.   Por favor, tenga en cuenta que aunque hacemos todo lo posible para estar disponibles para asuntos urgentes fuera del horario de [place], no estamos disponibles las 24 horas del da, los 7 [address]  [address] de la [place].   Si tiene un problema urgente y no puede comunicarse con nosotros, puede optar por buscar atencin mdica  en el consultorio de su doctor(a), en una clnica privada, en un centro de atencin urgente o en una sala de emergencias.  Si tiene Engineer, drilling, por favor llame inmediatamente al 911 o vaya a la sala de emergencias.  Nmeros de bper  - Dr. Gwen Pounds: [phone]  - Dra. Roseanne Reno: [phone]  - Dr. Katrinka Blazing: [phone]   En caso de inclemencias del  tiempo, por favor llame a Lacy Duverney principal al [phone] para una actualizacin sobre el [place] de cualquier retraso o cierre.  Consejos para la medicacin en dermatologa: Por favor, guarde las cajas en las que vienen los medicamentos de uso tpico para ayudarle a seguir las instrucciones sobre dnde y cmo usarlos. Las farmacias generalmente imprimen las instrucciones del medicamento slo en las cajas y no directamente en los tubos del [place].   Si su medicamento es muy caro, por favor, pngase en contacto con Rolm Gala llamando al [phone] y presione la opcin 4 o envenos un mensaje a travs de Clinical cytogeneticist.   No podemos decirle cul ser su copago por los medicamentos por adelantado ya que esto es diferente dependiendo de la cobertura de su seguro. Sin embargo, es posible que podamos encontrar un medicamento sustituto a Audiological scientist un formulario para que el seguro cubra el medicamento que se considera necesario.   Si se requiere una autorizacin previa para que su compaa de seguros [place] su medicamento, por favor permtanos de 1 a 2 das hbiles para completar [address]  Los precios de los medicamentos varan con frecuencia dependiendo del Environmental consultant de dnde se surte la receta y alguna farmacias pueden ofrecer precios ms baratos.  El sitio web www.goodrx.com tiene cupones para medicamentos de Health and safety inspector. Los precios aqu no tienen en cuenta lo que podra costar con la ayuda del seguro (puede ser ms barato con su seguro), pero el sitio web puede darle el precio si no utiliz Tourist information centre manager.  - Puede imprimir el cupn correspondiente y llevarlo con su receta a la farmacia.  - Tambin puede pasar por nuestra oficina durante el horario de atencin regular y Education officer, museum una tarjeta de cupones de GoodRx.  - Si necesita que su receta se enve electrnicamente a una farmacia diferente, informe a nuestra oficina a travs de MyChart de [place] o por telfono  llamando al [phone] y presione la opcin 4.

## 2022-11-01 NOTE — Progress Notes (Signed)
Follow-Up Visit   Subjective  Ashley Contreras is a 62 y.o. female who presents for the following: Skin Cancer Screening and Full Body Skin Exam Hx of tinea unguium has used Kerydin solution in past- looks worse after recently removing nail polish, hx of rosacea , patient reports she did not like doryx (stomach upset) or zilxi. Is currently using metronidazole cream to affected areas. Also reports a bump inside left nose has appointment to see ENT.  Also reports itchy rash at face developed after gardening and exposure to poison ivy.    The patient presents for Total-Body Skin Exam (TBSE) for skin cancer screening and mole check. The patient has spots, moles and lesions to be evaluated, some may be new or changing and the patient may have concern these could be cancer.    The following portions of the chart were reviewed this encounter and updated as appropriate: medications, allergies, medical history  Review of Systems:  No other skin or systemic complaints except as noted in HPI or Assessment and Plan.  Objective  Well appearing patient in no apparent distress; mood and affect are within normal limits.  A full examination was performed including scalp, head, eyes, ears, nose, lips, neck, chest, axillae, abdomen, back, buttocks, bilateral upper extremities, bilateral lower extremities, hands, feet, fingers, toes, fingernails, and toenails. All findings within normal limits unless otherwise noted below.   Relevant physical exam findings are noted in the Assessment and Plan.    Assessment & Plan   SKIN CANCER SCREENING PERFORMED TODAY.  ACTINIC DAMAGE - Chronic condition, secondary to cumulative UV/sun exposure - diffuse scaly erythematous macules with underlying dyspigmentation - Recommend daily broad spectrum sunscreen SPF 30+ to sun-exposed areas, reapply every 2 hours as needed.  - Staying in the shade or wearing long sleeves, sun glasses (UVA+UVB protection) and wide brim hats  (4-inch brim around the entire circumference of the hat) are also recommended for sun protection.  - Call for new or changing lesions.  LENTIGINES, SEBORRHEIC KERATOSES, HEMANGIOMAS - Benign normal skin lesions - Benign-appearing - Call for any changes  MELANOCYTIC NEVI - Tan-brown and/or pink-flesh-colored symmetric macules and papules - Benign appearing on exam today - Observation - Call clinic for new or changing moles - Recommend daily use of broad spectrum spf 30+ sunscreen to sun-exposed areas.   Rosacea  Exam: mild erythema at nose and cheeks    Chronic and persistent condition with duration or expected duration over one year. Condition is symptomatic/ bothersome to patient. Not currently at goal.    Rosacea is a chronic progressive skin condition usually affecting the face of adults, causing redness and/or acne bumps. It is treatable but not curable. It sometimes affects the eyes (ocular rosacea) as well. It may respond to topical and/or systemic medication and can flare with stress, sun exposure, alcohol, exercise and some foods.  Daily application of broad spectrum spf 30+ sunscreen to face is recommended to reduce flares.  Treatment Plan:  Patient did not like Doryx (GI upset) or Zilxi foam Restart metronidazole 0.75 % cream - apply to affected areas nightly    Tinea unguium Exam: right great toenail White discoloration at right great toenail, left great toenail clear with slight whitish discoloration likely from nail polish  Photo today   Treatment Plan:   Restart  Kerydin nightly to affected toenail.  Discussed fluconazole once weekly for 7- 9 months if not responding to kerydin solution treatment Rx generic Kerydin sent to [facility], discussed using  Good Rx    Seborrheic dermatitis- mild Exam: Scalp with pink scaliness   Chronic and persistent condition with duration or expected duration over one year. Condition is symptomatic/ bothersome to patient. Not  currently at goal.    Seborrheic Dermatitis  -  is a chronic persistent rash characterized by pinkness and scaling most commonly of the mid face but also can occur on the scalp (dandruff), ears; mid chest, mid back and groin.  It tends to be exacerbated by stress and cooler weather.  People who have neurologic disease may experience new onset or exacerbation of existing seborrheic dermatitis.  The condition is not curable but treatable and can be controlled.  Treatment Plan:   Recommend OTC Head & Shoulders shampoo 2-3x per week, massage into scalp and let sit 3-5 minutes before rinsing.  Recommend OTC Scalpicin Extra Strength solution for spot treating to affected areas of scalp as needed for itch.    ALLERGIC CONTACT DERMATITIS due to Poison Ivy Exam: Pink edematous plaque at L lower cheek  Treatment Plan: Start Mometasone ointment - apply to affected areas of itchy rash twice daily until clear, no longer than 2 weeks on face Can also use prn for bug bites   Topical steroids (such as triamcinolone, fluocinolone, fluocinonide, mometasone, clobetasol, halobetasol, betamethasone, hydrocortisone) can cause thinning and lightening of the skin if they are used for too long in the same area. Your physician has selected the right strength medicine for your problem and area affected on the body. Please use your medication only as directed by your physician to prevent side effects.     Impetigo  Exam: red crusted papule inside left nostril   Treatment Plan: Apply mupirocin ointment twice daily inside BL nares for 1 week. If not improving, pt has appt with ENT for further evaluation   Allergic contact dermatitis due to plants, except food  Related Medications mometasone (ELOCON) 0.1 % ointment Apply 1 application topically to itchy rash twice daily as needed for 2 weeks. Avoid applying to eye area, groin, and axilla. Use as directed. Long-term use can cause thinning of the skin.  Tinea  unguium  Related Medications Tavaborole (KERYDIN) 5 % SOLN Apply to affected toenail nightly until improved.  Rosacea  Related Medications Doxycycline Hyclate (DORYX) 80 MG TBEC Take with food  metroNIDAZOLE (METROCREAM) 0.75 % cream Apply to nose 1-2 times a day for rosacea.  Irritant contact dermatitis due to plants, except food  Impetigo  Related Medications mupirocin ointment (BACTROBAN) 2 % Apply 1 application topically 2 (two) times daily. Apply inside both nares for 1 week  mupirocin ointment (BACTROBAN) 2 % Apply topically to bump inside left nose twice daily for 2 weeks.   Return in about 1 year (around 11/01/2023) for TBSE.  I, Asher Muir, CMA, am acting as scribe for Willeen Niece, MD.   Documentation: I have reviewed the above documentation for accuracy and completeness, and I agree with the above.  Willeen Niece, MD

## 2022-11-17 ENCOUNTER — Other Ambulatory Visit: Payer: Self-pay

## 2022-11-22 DIAGNOSIS — D140 Benign neoplasm of middle ear, nasal cavity and accessory sinuses: Secondary | ICD-10-CM | POA: Diagnosis not present

## 2022-12-16 DIAGNOSIS — H353132 Nonexudative age-related macular degeneration, bilateral, intermediate dry stage: Secondary | ICD-10-CM | POA: Diagnosis not present

## 2022-12-16 DIAGNOSIS — H04123 Dry eye syndrome of bilateral lacrimal glands: Secondary | ICD-10-CM | POA: Diagnosis not present

## 2022-12-29 ENCOUNTER — Other Ambulatory Visit: Payer: Self-pay

## 2023-01-02 NOTE — Progress Notes (Unsigned)
PCP: Bosie Clos, MD   No chief complaint on file.   HPI:      Ashley Contreras is a 62 y.o. No obstetric history on file. who LMP was No LMP recorded. Patient has had a hysterectomy., presents today for her annual examination.  Her menses are absent due to supracervical hyst BSO for menorrhagia/endometriosis 2008. No PMB. She doesn't vasomotor sx on vivelle-dot 0.0375 mg. Doing well and wants to continue.   Sex activity: single partner, contraception - status post hysterectomy. She does have some vaginal dryness, sometimes improved with lubricants. Hasn't tried coconut oil.  Last Pap: 12/23/21 Results were: no abnormalities /neg HPV DNA. Still has cx. Hx of STDs: none  Last mammogram: 07/29/22  Results were: normal--routine follow-up in 12 months There is a FH of breast cancer in her mom and 2 mat aunts. Pt is MyRisk neg except ATM VUS 9/20. IBIS=24.7%/riskscore=15.8%. There is no FH of ovarian cancer. The patient does do self-breast exams. Has not had screening breast MRI but may be interested next year.  Colonoscopy: 12/18 without abnormalities with Dr. Servando Snare;  Repeat due after 10 years per pt (hx of polyps in past). Pt has a hx of ext hemorrhoid, sometimes with flare, treats with analpram. Prefers supp to cream but not on formulary. DEXA: normal D[facility]A in spine and hip at [facility] 2016.   Tobacco use: The patient denies current or previous tobacco use. Alcohol use: none  No drug use Exercise: min active  She does get adequate calcium and Vitamin D in her diet. Hx of Vit D deficiency. Labs with PCP Diagnosed with macular degeneration and rosacea   Past Medical History:  Diagnosis Date   Abnormal Pap smear 1989   s/p colposcopy   Allergy    hay fever   Arthritis    Asthma    BRCA negative 10/2018   MyRisk neg except ATM VUS   Chicken pox    Chronically dry eyes 2021   Colon polyp    Complication of anesthesia    Family history of adverse reaction to anesthesia     sister - slow to wake   Family history of breast cancer    Fatty liver    seen by hepatology   GERD (gastroesophageal reflux disease)    Increased risk of breast cancer 10/2018   IBIS=24.7%/riskscore=15.8%   Mitral valve prolapse    PONV (postoperative nausea and vomiting)    Rosacea 10/2020   Shingles    waist, right side   UTI (lower urinary tract infection)    Wears contact lenses     Past Surgical History:  Procedure Laterality Date   ABDOMINAL HYSTERECTOMY  2008   BREAST BIOPSY Left    benign   calcaneous osteotomy  06/2011   COLONOSCOPY WITH PROPOFOL N/A 02/17/2017   Procedure: COLONOSCOPY WITH PROPOFOL;  Surgeon: Midge Minium, MD;  Location: [facility];  Service: Endoscopy;  Laterality: N/A;   ENDOSCOPIC RETROGRADE CHOLANGIOPANCREATOGRAPHY (ERCP) WITH PROPOFOL N/A 05/17/2020   Procedure: ENDOSCOPIC RETROGRADE CHOLANGIOPANCREATOGRAPHY (ERCP) WITH PROPOFOL;  Surgeon: Midge Minium, MD;  Location: [facility] ENDOSCOPY;  Service: Endoscopy;  Laterality: N/A;   ERCP N/A 06/18/2020   Procedure: ENDOSCOPIC RETROGRADE CHOLANGIOPANCREATOGRAPHY (ERCP);  Surgeon: Midge Minium, MD;  Location: [facility] END[facility];  Service: Endoscopy;  Laterality: N/A;   ESOPHAGOGASTRODUODENOSCOPY (EGD) WITH PROPOFOL N/A 02/17/2017   Procedure: ESOPHAGOGASTRODUODENOSCOPY (EGD) WITH PROPOFOL;  Surgeon: Midge Minium, MD;  Location: [facility];  Service: Endoscopy;  Laterality: N/A;  foot and ankle surgery     OOPHORECTOMY     TONSILLECTOMY AND ADENOIDECTOMY  2008    Family History  Problem Relation Age of Onset   Arthritis Mother    Hypertension Mother    Mental illness Mother    Diabetes Mother    Breast cancer Mother 72       right breast   Hyperlipidemia Father    Heart disease Father    Cancer Father        lung   Mental illness Sister    Myasthenia gravis Sister    Multiple sclerosis Sister    Stroke Maternal Aunt    Breast cancer Maternal Aunt 70       right breast   Breast  cancer Maternal Aunt 72       right breast   Stroke Maternal Grandmother    Hypertension Maternal Grandmother    Diabetes Maternal Grandmother    Breast cancer Other    Multiple sclerosis Other     Social History   Socioeconomic History   Marital status: Married    Spouse name: Not on file   Number of children: 2   Years of education: Not on file   Highest education level: Not on file  Occupational History   Occupation: Futures trader    Employer: [employer]  Tobacco Use   Smoking status: Never   Smokeless tobacco: Never  Vaping Use   Vaping status: Never Used  Substance and Sexual Activity   Alcohol use: No   Drug use: No   Sexual activity: Yes    Birth control/protection: Surgical    Comment: Hysterectomy  Other Topics Concern   Not on file  Social History Narrative   Lives in [place], 2 children. No pets.      Work - [employer]      Diet - regular, healthy   Exercise -bike sometimes, limited by injury   Social Determinants of Corporate investment banker Strain: Not on file  Food Insecurity: Not on file  Transportation Needs: Not on file  Physical Activity: Not on file  Stress: Not on file  Social Connections: Not on file  Intimate Partner Violence: Not on file     Current Outpatient Medications:    calcium-vitamin D (OSCAL WITH D) 500-200 MG-UNIT TABS tablet, Take by mouth., Disp: , Rfl:    Doxycycline Hyclate (DORYX) 80 MG TBEC, Take with food, Disp: 30 tablet, Rfl: 2   estradiol (VIVELLE-DOT) 0.0375 MG/24HR, PLACE 1 PATCH ONTO THE SKIN 2 TIMES A WEEK., Disp: 24 patch, Rfl: 3   Fexofenadine HCl (ALLEGRA PO), Take by mouth., Disp: , Rfl:    fluticasone (FLONASE) 50 MCG/ACT nasal spray, Place 2 sprays into both nostrils daily., Disp: 48 g, Rfl: 1   metroNIDAZOLE (METROCREAM) 0.75 % cream, Apply to nose 1-2 times a day for rosacea., Disp: 45 g, Rfl: 5   mometasone (ELOCON) 0.1 % ointment, Apply 1 application topically to itchy rash twice daily as needed  for 2 weeks. Avoid applying to eye area, groin, and axilla. Use as directed. Long-term use can cause thinning of the skin., Disp: 45 g, Rfl: 0   Multiple Vitamins-Minerals (PRESERVISION AREDS 2 PO), Take by mouth 2 (two) times daily., Disp: , Rfl:    mupirocin ointment (BACTROBAN) 2 %, Apply 1 application topically 2 (two) times daily. Apply inside both nares for 1 week, Disp: 22 g, Rfl: 1   mupirocin ointment (BACTROBAN) 2 %, Apply topically to  bump inside left nose twice daily for 2 weeks., Disp: 22 g, Rfl: 0   pantoprazole (PROTONIX) 40 MG tablet, Take 1 tablet (40 mg total) by mouth daily., Disp: 90 tablet, Rfl: 3   sucralfate (CARAFATE) 1 g tablet, Take 1 tablet (1 g total) by mouth 4 (four) times daily -  with meals and at bedtime., Disp: 270 tablet, Rfl: 3   Tavaborole (KERYDIN) 5 % SOLN, Apply to affected toenail nightly until improved., Disp: 10 mL, Rfl: 5   triamcinolone (KENALOG) 0.025 % cream, Apply 1 Application topically 2 (two) times daily., Disp: 30 g, Rfl: 0     ROS:  Review of Systems  Constitutional:  Negative for fatigue, fever and unexpected weight change.  Respiratory:  Negative for cough, shortness of breath and wheezing.   Cardiovascular:  Negative for chest pain, palpitations and leg swelling.  Gastrointestinal:  Negative for blood in stool, constipation, diarrhea, nausea and vomiting.  Endocrine: Negative for cold intolerance, heat intolerance and polyuria.  Genitourinary:  Negative for dyspareunia, dysuria, flank pain, frequency, genital sores, hematuria, menstrual problem, pelvic pain, urgency, vaginal bleeding, vaginal discharge and vaginal pain.  Musculoskeletal:  Negative for back pain, joint swelling and myalgias.  Skin:  Negative for rash.  Neurological:  Negative for dizziness, syncope, light-headedness, numbness and headaches.  Hematological:  Negative for adenopathy.  Psychiatric/Behavioral:  Negative for agitation, confusion, sleep disturbance and  suicidal ideas. The patient is not nervous/anxious.   BREAST: No symptoms    Objective: There were no vitals taken for this visit.   Physical Exam Constitutional:      Appearance: She is well-developed.  Genitourinary:     Vulva normal.     Genitourinary Comments: UTERUS SURG REM     Right Labia: No rash, tenderness or lesions.    Left Labia: No tenderness, lesions or rash.    Vaginal cuff intact.    No vaginal discharge, erythema or tenderness.      Right Adnexa: not tender and no mass present.    Left Adnexa: not tender and no mass present.    Cervix is not absent.     No cervical polyp.     Uterus is absent.  Breasts:    Right: No mass, nipple discharge, skin change or tenderness.     Left: No mass, nipple discharge, skin change or tenderness.  Neck:     Thyroid: No thyromegaly.  Cardiovascular:     Rate and Rhythm: Normal rate and regular rhythm.     Heart sounds: Normal heart sounds. No murmur heard. Pulmonary:     Effort: Pulmonary effort is normal.     Breath sounds: Normal breath sounds.  Abdominal:     Palpations: Abdomen is soft.     Tenderness: There is no abdominal tenderness. There is no guarding.  Musculoskeletal:        General: Normal range of motion.     Cervical back: Normal range of motion.  Neurological:     General: No focal deficit present.     Mental Status: She is alert and oriented to person, place, and time.     Cranial Nerves: No cranial nerve deficit.  Skin:    General: Skin is warm and dry.  Psychiatric:        Mood and Affect: Mood normal.        Behavior: Behavior normal.        Thought Content: Thought content normal.        Judgment: Judgment normal.  Vitals reviewed.     Assessment/Plan: Encounter for annual routine gynecological examination  Cervical cancer screening - Plan: Cytology - PAP  Screening for HPV (human papillomavirus) - Plan: Cytology - PAP  Encounter for screening mammogram for malignant neoplasm of  breast - Plan: MM 3D SCREEN BREAST BILATERAL, CANCELED: MM 3D SCREEN BREAST BILATERAL; pt current on mamo  Family history of breast cancer--Pt is MyRisk neg  Increased risk of breast cancer--pt aware of recommendations of monthly SBE, yearly CBE and mammos, and scr breast MRI. Will call after 5/24 mammo for MRI ref prn.  Declines this yr. Cont Vit D supp  Hormone replacement therapy (HRT) - Plan: estradiol (VIVELLE-DOT) 0.0375 MG/24HR  Vasomotor symptoms due to menopause - Plan: estradiol (VIVELLE-DOT) 0.0375 MG/24HR; doing well, Rx RF.    No orders of the defined types were placed in this encounter.          GYN counsel breast self exam, mammography screening, adequate intake of calcium and vitamin D, diet and exercise    F/U  No follow-ups on file.  Ivan Maskell B. Kathlene Yano, PA-C 01/02/2023 6:44 PM

## 2023-01-03 ENCOUNTER — Ambulatory Visit (INDEPENDENT_AMBULATORY_CARE_PROVIDER_SITE_OTHER): Payer: 59 | Admitting: Obstetrics and Gynecology

## 2023-01-03 ENCOUNTER — Other Ambulatory Visit: Payer: Self-pay

## 2023-01-03 ENCOUNTER — Encounter: Payer: Self-pay | Admitting: Obstetrics and Gynecology

## 2023-01-03 VITALS — BP 100/72 | Ht 66.0 in | Wt 129.0 lb

## 2023-01-03 DIAGNOSIS — K640 First degree hemorrhoids: Secondary | ICD-10-CM

## 2023-01-03 DIAGNOSIS — N951 Menopausal and female climacteric states: Secondary | ICD-10-CM

## 2023-01-03 DIAGNOSIS — Z803 Family history of malignant neoplasm of breast: Secondary | ICD-10-CM

## 2023-01-03 DIAGNOSIS — Z79890 Hormone replacement therapy (postmenopausal): Secondary | ICD-10-CM

## 2023-01-03 DIAGNOSIS — Z1231 Encounter for screening mammogram for malignant neoplasm of breast: Secondary | ICD-10-CM

## 2023-01-03 DIAGNOSIS — Z01419 Encounter for gynecological examination (general) (routine) without abnormal findings: Secondary | ICD-10-CM

## 2023-01-03 DIAGNOSIS — Z9189 Other specified personal risk factors, not elsewhere classified: Secondary | ICD-10-CM

## 2023-01-03 MED ORDER — ESTRADIOL 0.0375 MG/24HR TD PTTW
1.0000 | MEDICATED_PATCH | TRANSDERMAL | 3 refills | Status: DC
Start: 2023-01-05 — End: 2023-12-21
  Filled 2023-01-03: qty 24, 84d supply, fill #0
  Filled 2023-04-10: qty 24, 84d supply, fill #1
  Filled 2023-06-19: qty 24, 84d supply, fill #2
  Filled 2023-09-28: qty 24, 84d supply, fill #3

## 2023-01-03 MED ORDER — HYDROCORTISONE ACETATE 25 MG RE SUPP
25.0000 mg | Freq: Two times a day (BID) | RECTAL | 2 refills | Status: DC
Start: 2023-01-03 — End: 2024-01-08
  Filled 2023-01-03: qty 14, 7d supply, fill #0

## 2023-01-03 NOTE — Patient Instructions (Signed)
I value your feedback and you entrusting us with your care. If you get a [facility] patient survey, I would appreciate you taking the time to let us know about your experience today. Thank you! ? ? ?

## 2023-01-08 ENCOUNTER — Ambulatory Visit
Admission: RE | Admit: 2023-01-08 | Discharge: 2023-01-08 | Disposition: A | Discharge status: Home / Self Care | Payer: 59 | Source: Ambulatory Visit | Attending: Emergency Medicine | Admitting: Emergency Medicine

## 2023-01-08 VITALS — BP 110/72 | HR 100 | Temp 99.3°F | Resp 18

## 2023-01-08 DIAGNOSIS — B349 Viral infection, unspecified: Secondary | ICD-10-CM

## 2023-01-08 LAB — POC COVID19/FLU A&B COMBO
Covid Antigen, POC: NEGATIVE
Influenza A Antigen, POC: NEGATIVE
Influenza B Antigen, POC: NEGATIVE

## 2023-01-08 MED ORDER — BENZONATATE 100 MG PO CAPS: 100.0000 mg | ORAL_CAPSULE | Freq: Three times a day (TID) | ORAL | 0 refills | Status: DC | PRN

## 2023-01-08 NOTE — ED Triage Notes (Signed)
Patient to Urgent Care with complaints of cough/ hoarseness/ chills/ body aches/ diarrhea/ fatigue. Symptoms worse at night.   Reports symptoms started three days ago (woke up Friday morning w/ symptoms).   OTC meds: Dayquil and Nyquil.

## 2023-01-08 NOTE — Discharge Instructions (Addendum)
 The COVID and flu tests are negative.   Take Tylenol or ibuprofen as needed for fever or discomfort.  Take plain Mucinex as needed for congestion.  Rest and keep yourself hydrated.    Follow-up with your primary care provider if your symptoms are not improving.

## 2023-01-08 NOTE — ED Provider Notes (Signed)
[facility]    CSN: [id number] Arrival date & time: 01/08/23  1106      History   Chief Complaint Chief Complaint  Patient presents with   Cough    I have been running a low grade fever accompanied with chills, body ache, diarrhea, cough, congestion and general malaise - Entered by patient    HPI Ashley Contreras is a 62 y.o. female.  Patient presents with 3-day history of fatigue, body aches, low-grade fever, chills, hoarse voice, cough, diarrhea.  Tmax 100.  Treatment attempted with DayQuil and NyQuil.  No diarrhea today.  No sore throat, chest pain, shortness of breath, abdominal pain, vomiting, or other symptoms.     The history is provided by the patient and medical records.    Past Medical History:  Diagnosis Date   Abnormal Pap smear 1989   s/p colposcopy   Allergy    hay fever   Arthritis    Asthma    BRCA negative 10/2018   MyRisk neg except ATM VUS   Chicken pox    Chronically dry eyes 2021   Colon polyp    Complication of anesthesia    Family history of adverse reaction to anesthesia    sister - slow to wake   Family history of breast cancer    Fatty liver    seen by hepatology   GERD (gastroesophageal reflux disease)    Increased risk of breast cancer 10/2018   IBIS=24.7%/riskscore=15.8%   Mitral valve prolapse    PONV (postoperative nausea and vomiting)    Rosacea 10/2020   Shingles    waist, right side   UTI (lower urinary tract infection)    Wears contact lenses     Patient Active Problem List   Diagnosis Date Noted   Vasomotor symptoms due to menopause 12/21/2020   COVID-19 08/28/2020   Cough 08/28/2020   Nasal sinus congestion 08/28/2020   Sore throat (viral) 08/28/2020   Encounter for fitting and adjustment of other gastrointestinal appliance and device    Abnormal liver enzymes    Idiopathic acute pancreatitis without infection or necrosis    Jaundice    Elevated liver enzymes    Acute gallstone pancreatitis 05/16/2020    Increased risk of breast cancer 11/11/2019   External hemorrhoid 11/11/2019   Family history of breast cancer 11/06/2018   Chronic nonintractable headache 10/31/2017   Special screening for malignant neoplasms, colon    Fatty liver 04/29/2016   Abdominal pain, chronic, right lower quadrant 01/29/2016   Palpitations 01/29/2016   Subclinical hyperthyroidism 04/24/2015   Encounter for general adult medical examination with abnormal findings 11/02/2012   History of colonic polyps 10/29/2012   GERD (gastroesophageal reflux disease) 09/21/2012   Chronic gastritis 09/21/2012   Dyspareunia 04/25/2012   Cervical radiculopathy 11/03/2011   Seasonal allergies 10/21/2011    Past Surgical History:  Procedure Laterality Date   ABDOMINAL HYSTERECTOMY  2008   BREAST BIOPSY Left    benign   calcaneous osteotomy  06/2011   COLONOSCOPY WITH PROPOFOL N/A 02/17/2017   Procedure: COLONOSCOPY WITH PROPOFOL;  Surgeon: Midge Minium, MD;  Location: [facility];  Service: Endoscopy;  Laterality: N/A;   ENDOSCOPIC RETROGRADE CHOLANGIOPANCREATOGRAPHY (ERCP) WITH PROPOFOL N/A 05/17/2020   Procedure: ENDOSCOPIC RETROGRADE CHOLANGIOPANCREATOGRAPHY (ERCP) WITH PROPOFOL;  Surgeon: Midge Minium, MD;  Location: [facility] ENDOSCOPY;  Service: Endoscopy;  Laterality: N/A;   ERCP N/A 06/18/2020   Procedure: ENDOSCOPIC RETROGRADE CHOLANGIOPANCREATOGRAPHY (ERCP);  Surgeon: Midge Minium, MD;  Location: [facility]  ENDOSCOPY;  Service: Endoscopy;  Laterality: N/A;   ESOPHAGOGASTRODUODENOSCOPY (EGD) WITH PROPOFOL N/A 02/17/2017   Procedure: ESOPHAGOGASTRODUODENOSCOPY (EGD) WITH PROPOFOL;  Surgeon: Midge Minium, MD;  Location: [facility];  Service: Endoscopy;  Laterality: N/A;   foot and ankle surgery     OOPHORECTOMY     TONSILLECTOMY AND ADENOIDECTOMY  2008    OB History     Gravida  2   Para  2   Term  2   Preterm      AB      Living  2      SAB      IAB      Ectopic      Multiple      Live  Births  2            Home Medications    Prior to Admission medications   Medication Sig Start Date End Date Taking? Authorizing Provider  benzonatate (TESSALON) 100 MG capsule Take 1 capsule (100 mg total) by mouth 3 (three) times daily as needed for cough. 01/08/23  Yes Mickie Bail, NP  calcium-vitamin D (OSCAL WITH D) 500-200 MG-UNIT TABS tablet Take by mouth.    [provider]  estradiol (VIVELLE-DOT) 0.0375 MG/24HR Place 1 patch onto the skin 2 (two) times a week. 01/05/23 01/05/24  Copland, Ilona Sorrel, PA-C  Fexofenadine HCl (ALLEGRA PO) Take by mouth.    [provider]  FINAPOD 0.1-7 % SOLN Apply topically 2 (two) times daily. 11/22/22   [provider]  fluticasone (FLONASE) 50 MCG/ACT nasal spray Place 2 sprays into both nostrils daily. 11/11/21   Bosie Clos, MD  hydrocortisone (ANUSOL-HC) 25 MG suppository Place 1 suppository (25 mg total) rectally 2 (two) times daily for 7 days. 01/03/23 01/12/23  Copland, Ilona Sorrel, PA-C  metroNIDAZOLE (METROCREAM) 0.75 % cream Apply to nose 1-2 times a day for rosacea. 11/01/22   Willeen Niece, MD  mometasone (ELOCON) 0.1 % ointment Apply 1 application topically to itchy rash twice daily as needed for 2 weeks. Avoid applying to eye area, groin, and axilla. Use as directed. Long-term use can cause thinning of the skin. 11/01/22   Willeen Niece, MD  Multiple Vitamins-Minerals (PRESERVISION AREDS 2 PO) Take by mouth 2 (two) times daily.    [provider]  pantoprazole (PROTONIX) 40 MG tablet Take 1 tablet (40 mg total) by mouth daily. 09/21/22     sucralfate (CARAFATE) 1 g tablet Take 1 tablet (1 g total) by mouth 4 (four) times daily -  with meals and at bedtime. 11/11/21   Bosie Clos, MD  Tavaborole (KERYDIN) 5 % SOLN Apply to affected toenail nightly until improved. 11/01/22   Willeen Niece, MD  triamcinolone (KENALOG) 0.025 % cream Apply 1 Application topically 2 (two) times daily. 07/22/22   Freddy Finner, NP    Family History Family History  Problem Relation Age of Onset   Arthritis Mother    Hypertension Mother    Mental illness Mother    Diabetes Mother    Breast cancer Mother 74       right breast   Hyperlipidemia Father    Heart disease Father    Cancer Father        lung   Mental illness Sister    Myasthenia gravis Sister    Multiple sclerosis Sister    Stroke Maternal Aunt    Breast cancer Maternal Aunt 95       right breast  Breast cancer Maternal Aunt 72       right breast   Stroke Maternal Grandmother    Hypertension Maternal Grandmother    Diabetes Maternal Grandmother    Breast cancer Other    Multiple sclerosis Other     Social History Social History   Tobacco Use   Smoking status: Never   Smokeless tobacco: Never  Vaping Use   Vaping status: Never Used  Substance Use Topics   Alcohol use: No   Drug use: No     Allergies   Penicillins and Cinnamon   Review of Systems Review of Systems  Constitutional:  Positive for chills and fever.  HENT:  Positive for voice change. Negative for ear pain and sore throat.   Respiratory:  Positive for cough. Negative for shortness of breath.   Cardiovascular:  Negative for chest pain and palpitations.  Gastrointestinal:  Positive for diarrhea. Negative for abdominal pain and vomiting.     Physical Exam Triage Vital Signs ED Triage Vitals  Encounter Vitals Group     BP --      Systolic BP Percentile --      Diastolic BP Percentile --      Pulse Rate 01/08/23 1116 100     Resp 01/08/23 1116 18     Temp 01/08/23 1116 99.3 F (37.4 C)     Temp src --      SpO2 01/08/23 1116 96 %     Weight --      Height --      Head Circumference --      Peak Flow --      Pain Score 01/08/23 1124 3     Pain Loc --      Pain Education --      Exclude from Growth Chart --    No data found.  Updated Vital Signs BP 110/72   Pulse 100   Temp 99.3 F (37.4 C)   Resp 18   SpO2 96%   Visual  Acuity Right Eye Distance:   Left Eye Distance:   Bilateral Distance:    Right Eye Near:   Left Eye Near:    Bilateral Near:     Physical Exam Constitutional:      General: She is not in acute distress. HENT:     Right Ear: Tympanic membrane normal.     Left Ear: Tympanic membrane normal.     Nose: Nose normal.     Mouth/Throat:     Mouth: Mucous membranes are moist.     Pharynx: Oropharynx is clear.     Comments: PND.  Hoarse voice. Cardiovascular:     Rate and Rhythm: Normal rate and regular rhythm.     Heart sounds: Normal heart sounds.  Pulmonary:     Effort: Pulmonary effort is normal. No respiratory distress.     Breath sounds: Normal breath sounds.  Abdominal:     General: Bowel sounds are normal.     Palpations: Abdomen is soft.     Tenderness: There is no abdominal tenderness. There is no guarding or rebound.  Skin:    General: Skin is warm and dry.  Neurological:     Mental Status: She is alert.      UC Treatments / Results  Labs (all labs ordered are listed, but only abnormal results are displayed) Labs Reviewed  POC COVID19/FLU A&B COMBO    EKG   Radiology No results found.  Procedures Procedures (including critical care time)  Medications Ordered in UC Medications - No data to display  Initial Impression / Assessment and Plan / UC Course  I have reviewed the triage vital signs and the nursing notes.  Pertinent labs & imaging results that were available during my care of the patient were reviewed by me and considered in my medical decision making (see chart for details).    Viral illness.  Afebrile and vital signs are stable.  Treating cough with Tessalon Perles.  Rapid COVID and flu negative.  Discussed symptomatic treatment including Tylenol or ibuprofen as needed for fever or discomfort, plain Mucinex as needed for congestion, rest, hydration.  Instructed patient to follow-up with PCP if not improving.  ED precautions given.  Patient  agrees to plan of care.   Final Clinical Impressions(s) / UC Diagnoses   Final diagnoses:  Viral illness     Discharge Instructions      The COVID and flu tests are negative.   Take Tylenol or ibuprofen as needed for fever or discomfort.  Take plain Mucinex as needed for congestion.  Rest and keep yourself hydrated.    Follow-up with your primary care provider if your symptoms are not improving.         ED Prescriptions     Medication Sig Dispense Auth. Provider   benzonatate (TESSALON) 100 MG capsule Take 1 capsule (100 mg total) by mouth 3 (three) times daily as needed for cough. 21 capsule Mickie Bail, NP      PDMP not reviewed this encounter.   Mickie Bail, NP 01/08/23 1151

## 2023-01-12 ENCOUNTER — Other Ambulatory Visit: Payer: Self-pay

## 2023-01-12 DIAGNOSIS — J069 Acute upper respiratory infection, unspecified: Secondary | ICD-10-CM | POA: Diagnosis not present

## 2023-01-12 DIAGNOSIS — J040 Acute laryngitis: Secondary | ICD-10-CM | POA: Diagnosis not present

## 2023-01-12 DIAGNOSIS — R051 Acute cough: Secondary | ICD-10-CM | POA: Diagnosis not present

## 2023-01-12 MED ORDER — PREDNISONE 20 MG PO TABS
20.0000 mg | ORAL_TABLET | Freq: Every day | ORAL | 0 refills | Status: DC
  Filled 2023-01-12: qty 7, 7d supply, fill #0

## 2023-01-12 MED ORDER — HYDROCOD POLI-CHLORPHE POLI ER 10-8 MG/5ML PO SUER
5.0000 mL | Freq: Two times a day (BID) | ORAL | 0 refills | Status: DC | PRN
  Filled 2023-01-12: qty 115, 12d supply, fill #0

## 2023-01-17 ENCOUNTER — Other Ambulatory Visit: Payer: Self-pay

## 2023-01-17 MED ORDER — PREDNISONE 20 MG PO TABS
20.0000 mg | ORAL_TABLET | Freq: Every day | ORAL | 0 refills | Status: DC
  Filled 2023-01-17: qty 5, 5d supply, fill #0

## 2023-01-18 ENCOUNTER — Other Ambulatory Visit: Payer: Self-pay

## 2023-01-18 MED ORDER — LIFITEGRAST 5 % OP SOLN
OPHTHALMIC | 6 refills | Status: DC
  Filled 2023-01-18: qty 60, 30d supply, fill #0
  Filled 2023-02-20: qty 60, 30d supply, fill #1
  Filled 2023-06-19: qty 60, 30d supply, fill #2
  Filled 2023-09-05: qty 60, 30d supply, fill #3
  Filled 2023-10-24: qty 60, 30d supply, fill #4

## 2023-01-26 IMAGING — MR MR 3D RECON AT SCANNER
2 series · 13 of 16 positions shown · IV contrast (gadavist)
Comparison: Same-day right upper quadrant ultrasound. CT abdomen
pelvis February 12, 2016.

CLINICAL DATA: Concern for choledocholithiasis.

EXAM:
MRI ABDOMEN WITHOUT AND WITH CONTRAST (INCLUDING MRCP)
TECHNIQUE: Multiplanar multisequence MR imaging of the abdomen was performed
both before and after the administration of intravenous contrast.
Heavily T2-weighted images of the biliary and pancreatic ducts were
obtained, and three-dimensional MRCP images were rendered by post
processing.
CONTRAST:  7.5mL GADAVIST GADOBUTROL 1 MMOL/ML IV SOLN

[Series 12: MRCP · coronal · 1.0mm · 0.49mm/px · 12 of 75 slices shown (1 of 2)]
[im 1/75]
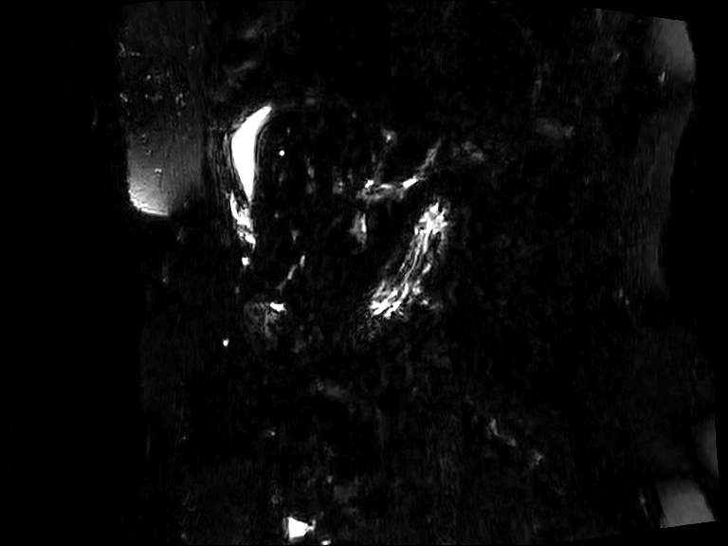
[im 6/75]
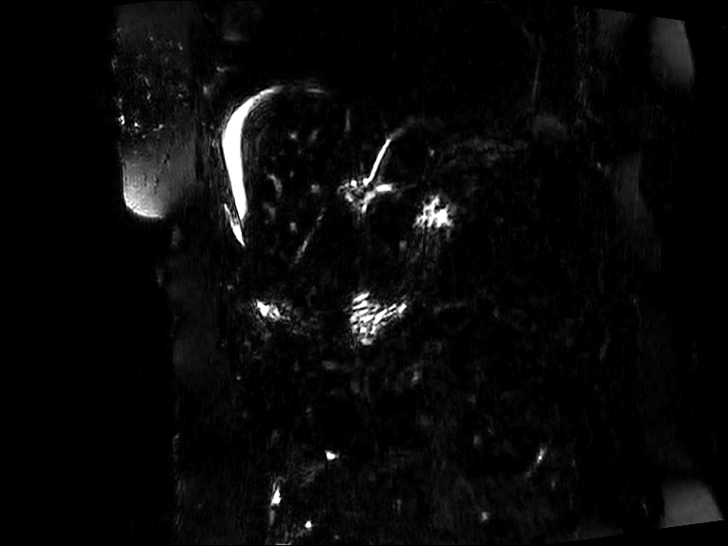
[im 16/75]
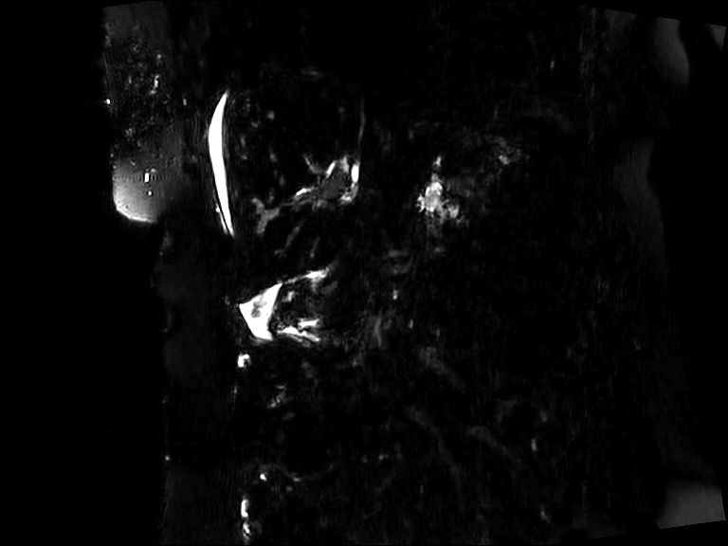
[im 22/75]
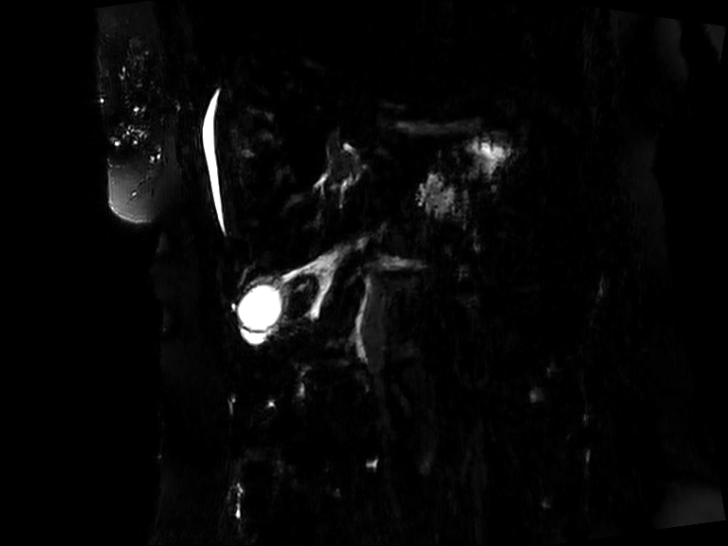
[im 27/75]
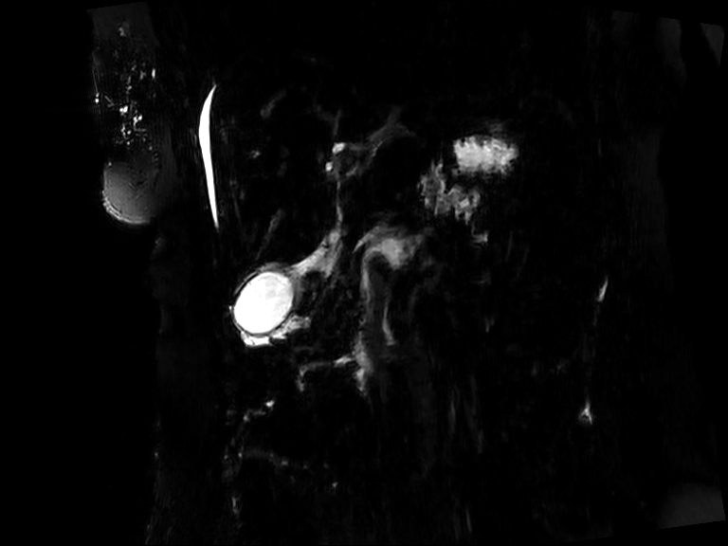
[im 32/75]
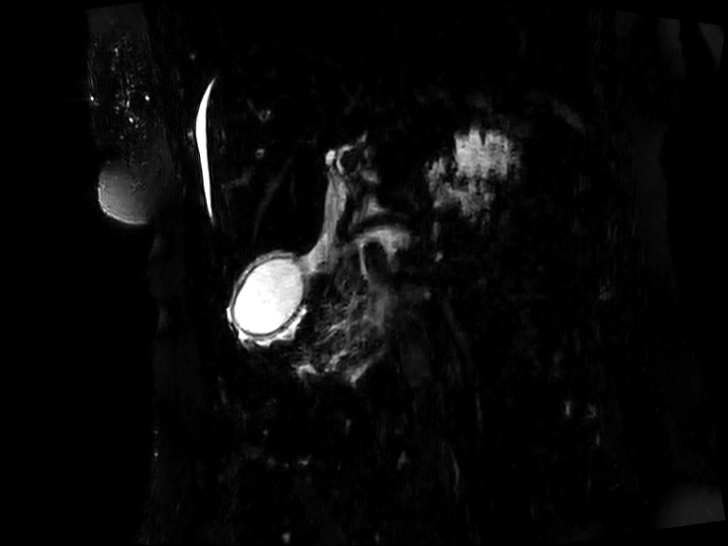
[im 43/75]
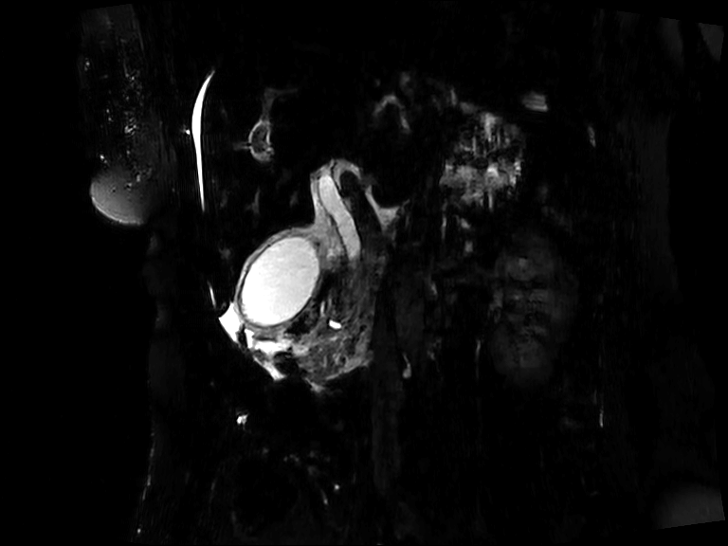
[im 48/75]
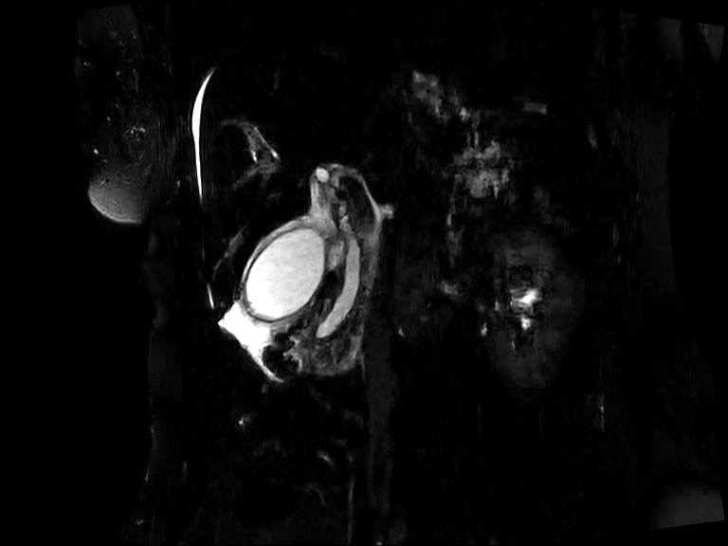
[im 53/75]
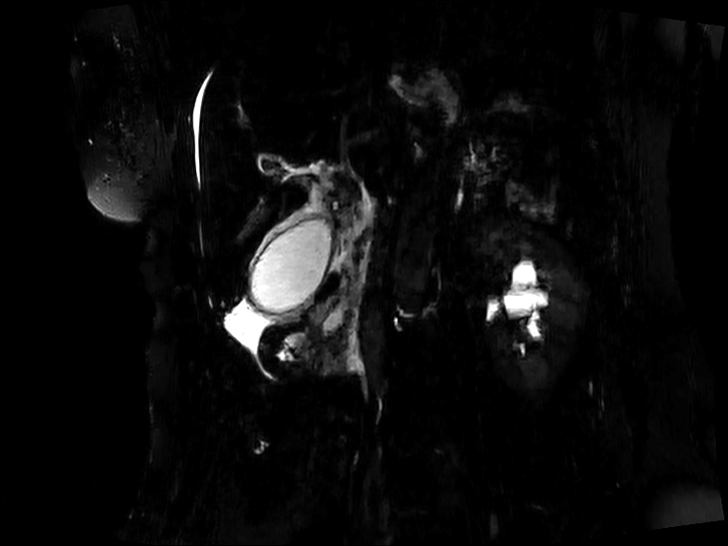
[im 59/75]
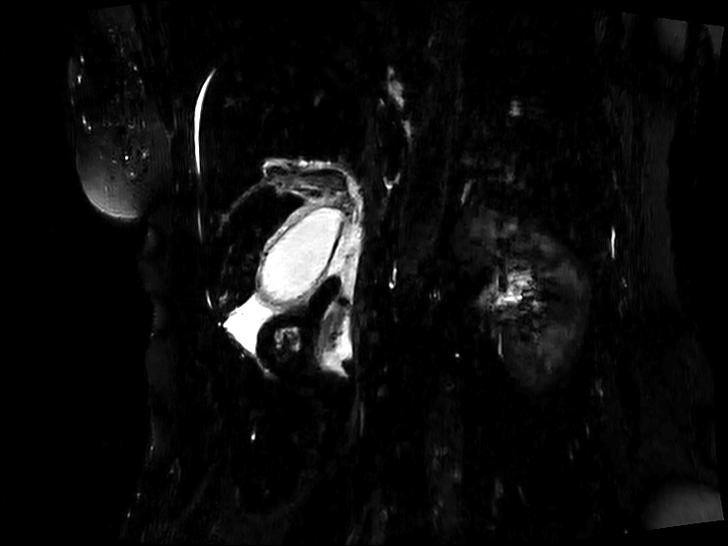
[im 64/75]
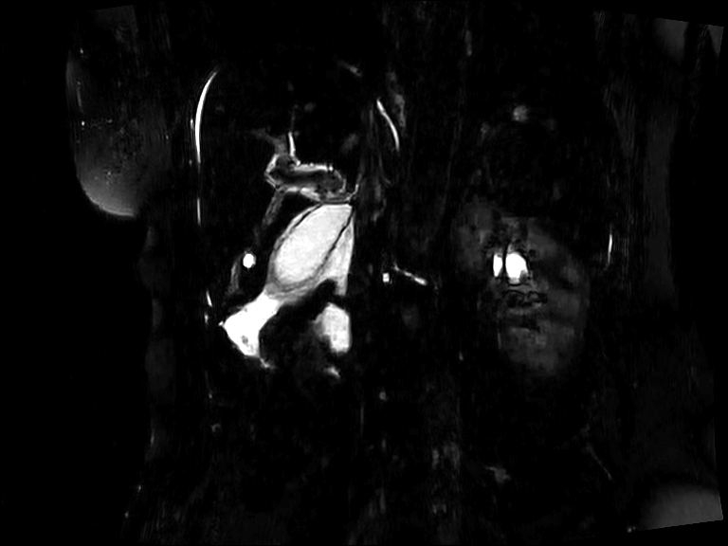
[im 75/75]
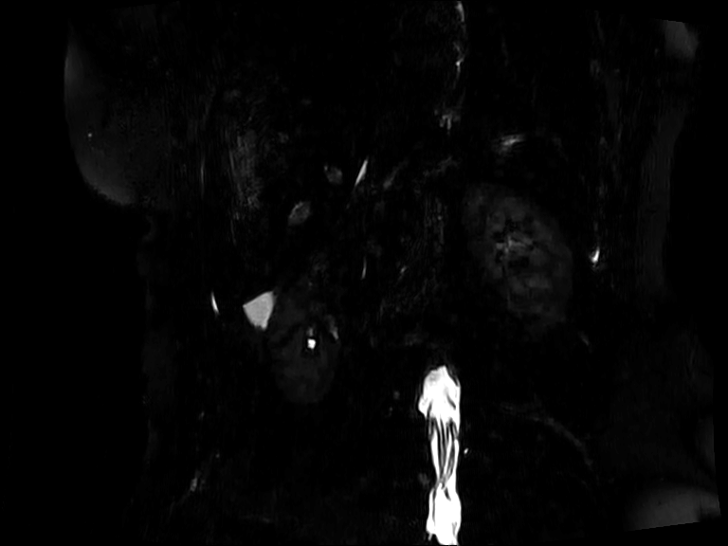

[Series 1039: MRCP · coronal · 0.5mm · 0.47mm/px · 1 of 4 slices shown (2 of 2)]
[im 1/4]
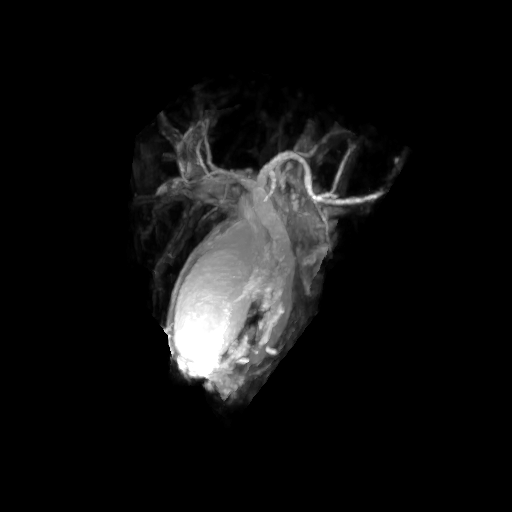

[13 of 16 positions shown; findings below may reference images not displayed]

FINDINGS: Lower chest: No acute finding.

Hepatobiliary: No hepatic steatosis. Tiny bilobar hepatic cysts the
largest of which is in the inferior aspect of the right lobe of the
liver measuring 7 mm on image [DATE]. No suspicious hepatic hepatic
lesions.

Gallbladder is dilated with pericholecystic fluid. No
cholelithiasis. There is prominence of the intra and extrahepatic
biliary tree with the common duct measuring up to 7 mm on image
[DATE]. With abrupt narrowing at the ampulla. Peribiliary enhancement
most notably involving the pancreatic portions of the common bile
duct and at the ampulla on image 40/21. No discrete intraluminal
filling defect visualized within the bile ducts.

Pancreas: Edematous appearance of the uncinate process/head of the
pancreas with adjacent peripancreatic stranding, no walled off
collection. No pancreatic hypoenhancement. Probable pancreatic
divisum. No pancreatic ductal dilation.

Spleen:  Within normal limits in size and appearance.

Adrenals/Urinary Tract: Bilateral adrenal glands are unremarkable.
Bilateral renal cysts. No suspicious enhancing renal lesions.

Stomach/Bowel: Stomach is grossly unremarkable. There is mild
hyperenhancement and wall thickening of the second part of duodenum.
No evidence of bowel obstruction.

Vascular/Lymphatic: No pathologically enlarged lymph nodes
identified. No abdominal aortic aneurysm demonstrated. The portal
splenic and hepatic veins appear patent.

Other:  No walled off fluid collections.

Musculoskeletal: No suspicious bone lesions identified.
IMPRESSION: 1. Acute interstitial pancreatitis involving the uncinate process
and head of the pancreas, no walled off collection or evidence of
necrosis.
2. Mild prominence of the intra and extrahepatic biliary tree with
abrupt narrowing at the ampulla with peribiliary enhancement most
notably involving the pancreatic portions of the common bile duct
and at the ampulla. No choledocholithiasis or discrete enhancing
intraluminal filling defect visualized. Findings which may represent
sequela of recently passed stone however given the narrowing and
enhancement ampullary pathology is also a consideration, this could
be further evaluated with ERCP.
3. Dilated gallbladder with pericholecystic fluid findings which are
likely reactive. However, acute cholecystitis not completely
excluded.
4. Probable pancreatic divisum.

## 2023-02-20 ENCOUNTER — Other Ambulatory Visit: Payer: Self-pay

## 2023-02-21 ENCOUNTER — Other Ambulatory Visit: Payer: Self-pay

## 2023-02-26 ENCOUNTER — Other Ambulatory Visit: Payer: Self-pay

## 2023-02-28 ENCOUNTER — Other Ambulatory Visit: Payer: Self-pay

## 2023-03-01 ENCOUNTER — Other Ambulatory Visit: Payer: Self-pay

## 2023-03-01 MED ORDER — SUCRALFATE 1 G PO TABS
1.0000 g | ORAL_TABLET | Freq: Four times a day (QID) | ORAL | 3 refills | Status: AC
  Filled 2023-03-01: qty 270, 68d supply, fill #0

## 2023-03-08 ENCOUNTER — Other Ambulatory Visit: Payer: Self-pay

## 2023-03-08 DIAGNOSIS — M25551 Pain in right hip: Secondary | ICD-10-CM | POA: Diagnosis not present

## 2023-03-08 DIAGNOSIS — M533 Sacrococcygeal disorders, not elsewhere classified: Secondary | ICD-10-CM | POA: Diagnosis not present

## 2023-03-08 DIAGNOSIS — G8929 Other chronic pain: Secondary | ICD-10-CM | POA: Diagnosis not present

## 2023-03-08 MED ORDER — TIZANIDINE HCL 4 MG PO TABS
4.0000 mg | ORAL_TABLET | Freq: Three times a day (TID) | ORAL | 1 refills | Status: AC | PRN
  Filled 2023-03-08: qty 30, 10d supply, fill #0

## 2023-03-08 MED ORDER — PREDNISONE 10 MG PO TABS
ORAL_TABLET | ORAL | 0 refills | Status: AC
  Filled 2023-03-08: qty 42, 12d supply, fill #0

## 2023-03-10 ENCOUNTER — Encounter (INDEPENDENT_AMBULATORY_CARE_PROVIDER_SITE_OTHER): Payer: 59 | Admitting: Ophthalmology

## 2023-03-10 ENCOUNTER — Other Ambulatory Visit: Payer: Self-pay

## 2023-04-10 ENCOUNTER — Other Ambulatory Visit: Payer: Self-pay

## 2023-04-21 DIAGNOSIS — L659 Nonscarring hair loss, unspecified: Secondary | ICD-10-CM | POA: Diagnosis not present

## 2023-04-21 DIAGNOSIS — L728 Other follicular cysts of the skin and subcutaneous tissue: Secondary | ICD-10-CM | POA: Diagnosis not present

## 2023-06-16 DIAGNOSIS — H353132 Nonexudative age-related macular degeneration, bilateral, intermediate dry stage: Secondary | ICD-10-CM | POA: Diagnosis not present

## 2023-06-20 ENCOUNTER — Other Ambulatory Visit: Payer: Self-pay

## 2023-07-05 ENCOUNTER — Ambulatory Visit
Admission: RE | Admit: 2023-07-05 | Discharge: 2023-07-05 | Disposition: A | Discharge status: Home / Self Care | Payer: Self-pay | Source: Ambulatory Visit | Attending: Emergency Medicine | Admitting: Emergency Medicine

## 2023-07-05 ENCOUNTER — Other Ambulatory Visit: Payer: Self-pay

## 2023-07-05 VITALS — BP 112/72 | HR 69 | Temp 98.8°F | Resp 16

## 2023-07-05 DIAGNOSIS — S81831A Puncture wound without foreign body, right lower leg, initial encounter: Secondary | ICD-10-CM | POA: Diagnosis not present

## 2023-07-05 MED ORDER — DOXYCYCLINE HYCLATE 100 MG PO CAPS
100.0000 mg | ORAL_CAPSULE | Freq: Two times a day (BID) | ORAL | 0 refills | Status: DC
  Filled 2023-07-05: qty 14, 7d supply, fill #0

## 2023-07-05 MED ORDER — PREDNISONE 10 MG PO TABS
ORAL_TABLET | ORAL | 0 refills | Status: AC
  Filled 2023-07-05: qty 21, 6d supply, fill #0

## 2023-07-05 NOTE — Discharge Instructions (Addendum)
 Today you were evaluated for your insect bite as you have seen puslike drainage she will be started on antibiotic  Take doxycycline  twice daily for 7 days  To further help reduce swelling begin prednisone  every morning as directed  Cleanse daily during normal hygiene with soap and water , may leave open to air cover per preference  For any delays in healing may follow-up with urgent care or primary doctor for reevaluation

## 2023-07-05 NOTE — ED Provider Notes (Signed)
 Ar[facility] Bellman    CSN: [id number] Arrival date & time: 07/05/23  1020      History   Chief Complaint Chief Complaint  Patient presents with   Insect Bite    Large red circular lesion on lower right leg. - Entered by patient    HPI Ashley Contreras is a 63 y.o. female.   Patient presents for evaluation of possible insect bite to the right lower leg beginning 5 days ago.  Endorses she was outside gardening prior to symptoms occurring but insect unwitnessed, did see mosquito bite the left upper extremity.  Endorses erythema and swelling with purulent drainage, symptoms improving today.  Has been applying cortisone.  Denies fever.  Past Medical History:  Diagnosis Date   Abnormal Pap smear 1989   s/p colposcopy   Allergy    hay fever   Arthritis    Asthma    BRCA negative 10/2018   MyRisk neg except ATM VUS   Chicken pox    Chronically dry eyes 2021   Colon polyp    Complication of anesthesia    Family history of adverse reaction to anesthesia    sister - slow to wake   Family history of breast cancer    Fatty liver    seen by hepatology   GERD (gastroesophageal reflux disease)    Increased risk of breast cancer 10/2018   IBIS=24.7%/riskscore=15.8%   Mitral valve prolapse    PONV (postoperative nausea and vomiting)    Rosacea 10/2020   Shingles    waist, right side   UTI (lower urinary tract infection)    Wears contact lenses     Patient Active Problem List   Diagnosis Date Noted   Vasomotor symptoms due to menopause 12/21/2020   COVID-19 08/28/2020   Cough 08/28/2020   Nasal sinus congestion 08/28/2020   Sore throat (viral) 08/28/2020   Encounter for fitting and adjustment of other gastrointestinal appliance and device    Abnormal liver enzymes    Idiopathic acute pancreatitis without infection or necrosis    Jaundice    Elevated liver enzymes    Acute gallstone pancreatitis 05/16/2020   Increased risk of breast cancer 11/11/2019   External  hemorrhoid 11/11/2019   Family history of breast cancer 11/06/2018   Chronic nonintractable headache 10/31/2017   Special screening for malignant neoplasms, colon    Fatty liver 04/29/2016   Abdominal pain, chronic, right lower quadrant 01/29/2016   Palpitations 01/29/2016   Subclinical hyperthyroidism 04/24/2015   Encounter for general adult medical examination with abnormal findings 11/02/2012   History of colonic polyps 10/29/2012   GERD (gastroesophageal reflux disease) 09/21/2012   Chronic gastritis 09/21/2012   Dyspareunia 04/25/2012   Cervical radiculopathy 11/03/2011   Seasonal allergies 10/21/2011    Past Surgical History:  Procedure Laterality Date   ABDOMINAL HYSTERECTOMY  2008   BREAST BIOPSY Left    benign   calcaneous osteotomy  06/2011   COLONOSCOPY WITH PROPOFOL  N/A 02/17/2017   Procedure: COLONOSCOPY WITH PROPOFOL ;  Surgeon: Marnee Sink, MD;  Location: [facility];  Service: Endoscopy;  Laterality: N/A;   ENDOSCOPIC RETROGRADE CHOLANGIOPANCREATOGRAPHY (ERCP) WITH PROPOFOL  N/A 05/17/2020   Procedure: ENDOSCOPIC RETROGRADE CHOLANGIOPANCREATOGRAPHY (ERCP) WITH PROPOFOL ;  Surgeon: Marnee Sink, MD;  Location: [facility];  Service: Endoscopy;  Laterality: N/A;   ERCP N/A 06/18/2020   Procedure: ENDOSCOPIC RETROGRADE CHOLANGIOPANCREATOGRAPHY (ERCP);  Surgeon: Marnee Sink, MD;  Location: [facility];  Service: Endoscopy;  Laterality: N/A;   ESOPHAGOGASTRODUODENOSCOPY (EGD) WITH PROPOFOL   N/A 02/17/2017   Procedure: ESOPHAGOGASTRODUODENOSCOPY (EGD) WITH PROPOFOL ;  Surgeon: Marnee Sink, MD;  Location: [facility];  Service: Endoscopy;  Laterality: N/A;   foot and ankle surgery     OOPHORECTOMY     TONSILLECTOMY AND ADENOIDECTOMY  2008    OB History     Gravida  2   Para  2   Term  2   Preterm      AB      Living  2      SAB      IAB      Ectopic      Multiple      Live Births  2            Home Medications    Prior  to Admission medications   Medication Sig Start Date End Date Taking? Authorizing Provider  doxycycline  (VIBRAMYCIN ) 100 MG capsule Take 1 capsule (100 mg total) by mouth 2 (two) times daily. 07/05/23  Yes Lexis Potenza R, NP  predniSONE  (DELTASONE ) 10 MG tablet Take 6 tablets (60 mg total) by mouth daily for 1 day, THEN 5 tablets (50 mg total) daily for 1 day, THEN 4 tablets (40 mg total) daily for 1 day, THEN 3 tablets (30 mg total) daily for 1 day, THEN 2 tablets (20 mg total) daily for 1 day, THEN 1 tablet (10 mg total) daily for 1 day. 07/05/23 07/11/23 Yes Trent Gabler, Maybelle Spatz, NP  benzonatate  (TESSALON ) 100 MG capsule Take 1 capsule (100 mg total) by mouth 3 (three) times daily as needed for cough. 01/08/23   Wellington Half, NP  calcium-vitamin D  (OSCAL WITH D) 500-200 MG-UNIT TABS tablet Take by mouth.    [provider]  estradiol  (VIVELLE -DOT) 0.0375 MG/24HR Place 1 patch onto the skin 2 (two) times a week. 01/05/23 01/05/24  Copland, Alicia B, PA-C  Fexofenadine HCl (ALLEGRA PO) Take by mouth.    [provider]  FINAPOD 0.1-7 % SOLN Apply topically 2 (two) times daily. 11/22/22   [provider]  fluticasone  (FLONASE ) 50 MCG/ACT nasal spray Place 2 sprays into both nostrils daily. 11/11/21   Nikki Barters, MD  Lifitegrast  (XIIDRA ) 5 % SOLN PLACE 1 DROP INTO BOTH EYES TWICE A DAY 01/18/23 01/18/24    metroNIDAZOLE  (METROCREAM ) 0.75 % cream Apply to nose 1-2 times a day for rosacea. 11/01/22   Artemio Larry, MD  mometasone  (ELOCON ) 0.1 % ointment Apply 1 application topically to itchy rash twice daily as needed for 2 weeks. Avoid applying to eye area, groin, and axilla. Use as directed. Long-term use can cause thinning of the skin. 11/01/22   Artemio Larry, MD  Multiple Vitamins-Minerals (PRESERVISION AREDS 2 PO) Take by mouth 2 (two) times daily.    [provider]  pantoprazole  (PROTONIX ) 40 MG tablet Take 1 tablet (40 mg total) by mouth daily. 09/21/22      sucralfate  (CARAFATE ) 1 g tablet Take 1 tablet (1 g total) by mouth 4 (four) times daily -  with meals and at bedtime. 11/11/21   Nikki Barters, MD  sucralfate  (CARAFATE ) 1 g tablet Take 1 tablet (1 g total) by mouth 4 (four) times daily. 03/01/23     Tavaborole  (KERYDIN ) 5 % SOLN Apply to affected toenail nightly until improved. 11/01/22   Artemio Larry, MD  tiZANidine  (ZANAFLEX ) 4 MG tablet Take 1 tablet (4 mg total) by mouth 3 (three) times daily as needed. 03/08/23     triamcinolone  (KENALOG ) 0.025 % cream  Apply 1 Application topically 2 (two) times daily. 07/22/22   Lanetta Pion, NP    Family History Family History  Problem Relation Age of Onset   Arthritis Mother    Hypertension Mother    Mental illness Mother    Diabetes Mother    Breast cancer Mother 6       right breast   Hyperlipidemia Father    Heart disease Father    Cancer Father        lung   Mental illness Sister    Myasthenia gravis Sister    Multiple sclerosis Sister    Stroke Maternal Aunt    Breast cancer Maternal Aunt 70       right breast   Breast cancer Maternal Aunt 72       right breast   Stroke Maternal Grandmother    Hypertension Maternal Grandmother    Diabetes Maternal Grandmother    Breast cancer Other    Multiple sclerosis Other     Social History Social History   Tobacco Use   Smoking status: Never   Smokeless tobacco: Never  Vaping Use   Vaping status: Never Used  Substance Use Topics   Alcohol  use: No   Drug use: No     Allergies   Penicillins and Cinnamon   Review of Systems Review of Systems   Physical Exam Triage Vital Signs ED Triage Vitals [07/05/23 1052]  Encounter Vitals Group     BP 112/72     Systolic BP Percentile      Diastolic BP Percentile      Pulse Rate 69     Resp 16     Temp 98.8 F (37.1 C)     Temp Source Oral     SpO2 98 %     Weight      Height      Head Circumference      Peak Flow      Pain Score      Pain Loc      Pain Education       Exclude from Growth Chart    No data found.  Updated Vital Signs BP 112/72 (BP Location: Left Arm)   Pulse 69   Temp 98.8 F (37.1 C) (Oral)   Resp 16   SpO2 98%   Visual Acuity Right Eye Distance:   Left Eye Distance:   Bilateral Distance:    Right Eye Near:   Left Eye Near:    Bilateral Near:     Physical Exam Constitutional:      Appearance: Normal appearance.  Eyes:     Extraocular Movements: Extraocular movements intact.  Pulmonary:     Effort: Pulmonary effort is normal.  Skin:    Comments: Less than 0.5 cm blister present to the center of the right lower extremity with surrounding erythema, no swelling or deformity noted, no drainage at this time  Neurological:     Mental Status: She is alert and oriented to person, place, and time. Mental status is at baseline.      UC Treatments / Results  Labs (all labs ordered are listed, but only abnormal results are displayed) Labs Reviewed - No data to display  EKG   Radiology No results found.  Procedures Procedures (including critical care time)  Medications Ordered in UC Medications - No data to display  Initial Impression / Assessment and Plan / UC Course  I have reviewed the triage vital signs and the nursing notes.  Pertinent labs & imaging results that were available during my care of the patient were reviewed by me and considered in my medical decision making (see chart for details).  Puncture wound of right lower leg, initial encounter  Thought to be insect bite, insect unwitnessed, surrounding erythema and patient endorses purulent drainage therefore will initiate antibiotics, prescribed doxycycline , for management of swelling prescribed prednisone , recommended supportive care and advised follow-up for nonhealing site Final Clinical Impressions(s) / UC Diagnoses   Final diagnoses:  Puncture wound of right lower leg, initial encounter   Discharge Instructions      Today you were  evaluated for your insect bite as you have seen puslike drainage she will be started on antibiotic  Take doxycycline  twice daily for 7 days  To further help reduce swelling begin prednisone  every morning as directed  Cleanse daily during normal hygiene with soap and water , may leave open to air cover per preference  For any delays in healing may follow-up with urgent care or primary doctor for reevaluation  ED Prescriptions     Medication Sig Dispense Auth. Provider   doxycycline  (VIBRAMYCIN ) 100 MG capsule Take 1 capsule (100 mg total) by mouth 2 (two) times daily. 14 capsule Guy Seese R, NP   predniSONE  (DELTASONE ) 10 MG tablet Take 6 tablets (60 mg total) by mouth daily for 1 day, THEN 5 tablets (50 mg total) daily for 1 day, THEN 4 tablets (40 mg total) daily for 1 day, THEN 3 tablets (30 mg total) daily for 1 day, THEN 2 tablets (20 mg total) daily for 1 day, THEN 1 tablet (10 mg total) daily for 1 day. 21 tablet Carmichael Burdette R, NP      PDMP not reviewed this encounter.   Reena Canning, NP 07/05/23 1140

## 2023-07-18 DIAGNOSIS — G8929 Other chronic pain: Secondary | ICD-10-CM | POA: Diagnosis not present

## 2023-07-18 DIAGNOSIS — M545 Low back pain, unspecified: Secondary | ICD-10-CM | POA: Diagnosis not present

## 2023-07-18 DIAGNOSIS — M4135 Thoracogenic scoliosis, thoracolumbar region: Secondary | ICD-10-CM | POA: Diagnosis not present

## 2023-07-18 DIAGNOSIS — H353131 Nonexudative age-related macular degeneration, bilateral, early dry stage: Secondary | ICD-10-CM | POA: Diagnosis not present

## 2023-07-18 DIAGNOSIS — Z0000 Encounter for general adult medical examination without abnormal findings: Secondary | ICD-10-CM | POA: Diagnosis not present

## 2023-07-18 DIAGNOSIS — Z1331 Encounter for screening for depression: Secondary | ICD-10-CM | POA: Diagnosis not present

## 2023-08-01 ENCOUNTER — Ambulatory Visit
Admission: RE | Admit: 2023-08-01 | Discharge: 2023-08-01 | Disposition: A | Discharge status: Home / Self Care | Source: Ambulatory Visit | Attending: Obstetrics and Gynecology | Admitting: Obstetrics and Gynecology

## 2023-08-01 DIAGNOSIS — Z803 Family history of malignant neoplasm of breast: Secondary | ICD-10-CM | POA: Insufficient documentation

## 2023-08-01 DIAGNOSIS — Z1231 Encounter for screening mammogram for malignant neoplasm of breast: Secondary | ICD-10-CM | POA: Insufficient documentation

## 2023-08-01 DIAGNOSIS — Z9189 Other specified personal risk factors, not elsewhere classified: Secondary | ICD-10-CM | POA: Diagnosis not present

## 2023-08-03 ENCOUNTER — Ambulatory Visit: Payer: Self-pay | Admitting: Obstetrics and Gynecology

## 2023-09-05 ENCOUNTER — Other Ambulatory Visit: Payer: Self-pay

## 2023-09-28 ENCOUNTER — Other Ambulatory Visit: Payer: Self-pay

## 2023-10-24 ENCOUNTER — Other Ambulatory Visit: Payer: Self-pay

## 2023-10-24 MED ORDER — PANTOPRAZOLE SODIUM 40 MG PO TBEC
40.0000 mg | DELAYED_RELEASE_TABLET | Freq: Every day | ORAL | 3 refills | Status: AC
  Filled 2023-10-24: qty 90, 90d supply, fill #0

## 2023-11-07 ENCOUNTER — Other Ambulatory Visit: Payer: Self-pay

## 2023-11-07 ENCOUNTER — Ambulatory Visit (INDEPENDENT_AMBULATORY_CARE_PROVIDER_SITE_OTHER): Payer: 59 | Admitting: Dermatology

## 2023-11-07 ENCOUNTER — Encounter: Payer: Self-pay | Admitting: Dermatology

## 2023-11-07 DIAGNOSIS — I781 Nevus, non-neoplastic: Secondary | ICD-10-CM | POA: Diagnosis not present

## 2023-11-07 DIAGNOSIS — L719 Rosacea, unspecified: Secondary | ICD-10-CM

## 2023-11-07 DIAGNOSIS — W57XXXA Bitten or stung by nonvenomous insect and other nonvenomous arthropods, initial encounter: Secondary | ICD-10-CM | POA: Diagnosis not present

## 2023-11-07 DIAGNOSIS — L570 Actinic keratosis: Secondary | ICD-10-CM | POA: Diagnosis not present

## 2023-11-07 DIAGNOSIS — Z1283 Encounter for screening for malignant neoplasm of skin: Secondary | ICD-10-CM | POA: Diagnosis not present

## 2023-11-07 DIAGNOSIS — L905 Scar conditions and fibrosis of skin: Secondary | ICD-10-CM

## 2023-11-07 DIAGNOSIS — B351 Tinea unguium: Secondary | ICD-10-CM | POA: Diagnosis not present

## 2023-11-07 DIAGNOSIS — L814 Other melanin hyperpigmentation: Secondary | ICD-10-CM | POA: Diagnosis not present

## 2023-11-07 DIAGNOSIS — D229 Melanocytic nevi, unspecified: Secondary | ICD-10-CM

## 2023-11-07 DIAGNOSIS — D1801 Hemangioma of skin and subcutaneous tissue: Secondary | ICD-10-CM

## 2023-11-07 DIAGNOSIS — W908XXA Exposure to other nonionizing radiation, initial encounter: Secondary | ICD-10-CM | POA: Diagnosis not present

## 2023-11-07 DIAGNOSIS — L237 Allergic contact dermatitis due to plants, except food: Secondary | ICD-10-CM

## 2023-11-07 DIAGNOSIS — L821 Other seborrheic keratosis: Secondary | ICD-10-CM

## 2023-11-07 MED ORDER — TAVABOROLE 5 % EX SOLN: CUTANEOUS | 5 refills | Status: AC

## 2023-11-07 MED ORDER — DOXYCYCLINE HYCLATE 20 MG PO TABS
40.0000 mg | ORAL_TABLET | Freq: Every day | ORAL | 5 refills | Status: DC
  Filled 2023-11-07: qty 60, 30d supply, fill #0

## 2023-11-07 MED ORDER — MOMETASONE FUROATE 0.1 % EX OINT
TOPICAL_OINTMENT | CUTANEOUS | 0 refills | Status: DC
  Filled 2023-11-07: qty 45, 20d supply, fill #0

## 2023-11-07 MED ORDER — METRONIDAZOLE 0.75 % EX CREA
TOPICAL_CREAM | CUTANEOUS | 5 refills | Status: AC
  Filled 2023-11-07: qty 45, 30d supply, fill #0

## 2023-11-07 NOTE — Progress Notes (Signed)
 Follow-Up Visit   Subjective  Ashley Contreras is a 63 y.o. female who presents for the following: Skin Cancer Screening and Full Body Skin Exam. No personal Hx of skin cancer.    The patient presents for Total-Body Skin Exam (TBSE) for skin cancer screening and mole check. The patient has spots, moles and lesions to be evaluated, some may be new or changing and the patient may have concern these could be cancer.  Rosacea. Uses Metronidazole  0.75% cream. Takes Doxycycline  daily for worse flares only. Takes 20 mg every day prn  Uses topical kerydin  for toenails- improved.    The following portions of the chart were reviewed this encounter and updated as appropriate: medications, allergies, medical history  Review of Systems:  No other skin or systemic complaints except as noted in HPI or Assessment and Plan.  Objective  Well appearing patient in no apparent distress; mood and affect are within normal limits.  A full examination was performed including scalp, head, eyes, ears, nose, lips, neck, chest, axillae, abdomen, back, buttocks, bilateral upper extremities, bilateral lower extremities, hands, feet, fingers, toes, fingernails, and toenails. All findings within normal limits unless otherwise noted below.   Relevant physical exam findings are noted in the Assessment and Plan.  Left Eyebrow Erythematous thin papules/macules with gritty scale.   Assessment & Plan   SKIN CANCER SCREENING PERFORMED TODAY.   LENTIGINES, SEBORRHEIC KERATOSES, HEMANGIOMAS - Benign normal skin lesions - 6 mm waxy tan macule at spinal upper back SK vs Lentigo - Benign-appearing - Call for any changes  MELANOCYTIC NEVI - Tan-brown and/or pink-flesh-colored symmetric macules and papules - Benign appearing on exam today - Observation - Call clinic for new or changing moles - Recommend daily use of broad spectrum spf 30+ sunscreen to sun-exposed areas.    ROSACEA Exam Mid face erythema with  telangiectasias, few pink papules on cheeks   Chronic and persistent condition with duration or expected duration over one year. Condition is symptomatic/ bothersome to patient. Not currently at goal.   Rosacea is a chronic progressive skin condition usually affecting the face of adults, causing redness and/or acne bumps. It is treatable but not curable. It sometimes affects the eyes (ocular rosacea) as well. It may respond to topical and/or systemic medication and can flare with stress, sun exposure, alcohol , exercise, topical steroids (including hydrocortisone /cortisone 10) and some foods.  Daily application of broad spectrum spf 30+ sunscreen to face is recommended to reduce flares.  Patient denies grittiness of the eyes  Treatment Plan  Continue Metronidazole  0.75% cream every day/bid as directed.  Continue Doxycycline  20 mg, take 2 Po every day with flares  Doxycycline  should be taken with food to prevent nausea. Do not lay down for 30 minutes after taking. Be cautious with sun exposure and use good sun protection while on this medication. Pregnant women should not take this medication.     INSECT BITE REACTION Exam: edematous pink papule(s) arms, back  Treatment Plan: Benign, observe.   Start Mometasone  ointment twice daily as needed for insect bites.   Topical steroids (such as triamcinolone , fluocinolone, fluocinonide, mometasone , clobetasol, halobetasol, betamethasone, hydrocortisone ) can cause thinning and lightening of the skin if they are used for too long in the same area. Your physician has selected the right strength medicine for your problem and area affected on the body. Please use your medication only as directed by your physician to prevent side effects.    Recommend DEET containing insect repellant (25% DEET or  higher) such as [place] [employer] or [employer] and wear protective clothing when outdoors.    Spider Veins - Dilated blue, purple or red veins at the  lower extremities - Reassured - Smaller vessels can be treated by sclerotherapy (a procedure to inject a medicine into the veins to make them disappear) if desired, but the treatment is not covered by insurance. $350 per treatment session. Larger vessels may be covered if symptomatic and we would refer to vascular surgeon if treatment desired.   SCAR, superficial, pt states noticed after bad case of poison ivy on face this summer Exam: tan-white smooth macule at left lower cheek.  Benign-appearing.  Observation.  Call clinic for new or changing lesions. Recommend daily broad spectrum sunscreen SPF 30+, reapply every 2 hours as needed.  Scars remodel on their own for a full year and will gradually improve in appearance over time.  Tinea unguium Exam: right great toenail Normal appearing nail today at right great toenail. Left great toenail with yellowish discoloration and slight thickening.   Chronic and persistent condition with duration or expected duration over one year. Condition is improving with treatment but not currently at goal.    Treatment Plan:   Restart Kerydin  nightly to affected Left toenail.   Rx generic Kerydin  sent to [facility], discussed using [facility]   ALLERGIC CONTACT DERMATITIS DUE TO PLANTS, EXCEPT FOOD   AK (ACTINIC KERATOSIS) Left Eyebrow Actinic keratoses are precancerous spots that appear secondary to cumulative UV radiation exposure/sun exposure over time. They are chronic with expected duration over 1 year. A portion of actinic keratoses will progress to squamous cell carcinoma of the skin. It is not possible to reliably predict which spots will progress to skin cancer and so treatment is recommended to prevent development of skin cancer.  Recommend daily broad spectrum sunscreen SPF 30+ to sun-exposed areas, reapply every 2 hours as needed.  Recommend staying in the shade or wearing long sleeves, sun glasses (UVA+UVB protection) and wide brim hats  (4-inch brim around the entire circumference of the hat). Call for new or changing lesions.  Plan cryotherapy treatment in the future. Has wedding to attend this weekend.  TINEA UNGUIUM   ROSACEA   Return in about 1 year (around 11/06/2024) for TBSE.  I, Kate Fought, CMA, am acting as scribe for Rexene Rattler, MD.   Documentation: I have reviewed the above documentation for accuracy and completeness, and I agree with the above.  Rexene Rattler, MD

## 2023-11-07 NOTE — Patient Instructions (Addendum)
 Continue Metronidazole  0.75% cream as directed.   Start Mometasone  ointment twice daily as needed for insect bites.   Topical steroids (such as triamcinolone , fluocinolone, fluocinonide, mometasone , clobetasol, halobetasol, betamethasone, hydrocortisone ) can cause thinning and lightening of the skin if they are used for too long in the same area. Your physician has selected the right strength medicine for your problem and area affected on the body. Please use your medication only as directed by your physician to prevent side effects.     Recommend daily broad spectrum sunscreen SPF 30+ to sun-exposed areas, reapply every 2 hours as needed. Call for new or changing lesions.  Staying in the shade or wearing long sleeves, sun glasses (UVA+UVB protection) and wide brim hats (4-inch brim around the entire circumference of the hat) are also recommended for sun protection.      Melanoma ABCDEs  Melanoma is the most dangerous type of skin cancer, and is the leading cause of death from skin disease.  You are more likely to develop melanoma if you: Have light-colored skin, light-colored eyes, or red or blond hair Spend a lot of time in the sun Tan regularly, either outdoors or in a tanning bed Have had blistering sunburns, especially during childhood Have a close family member who has had a melanoma Have atypical moles or large birthmarks  Early detection of melanoma is key since treatment is typically straightforward and cure rates are extremely high if we catch it early.   The first sign of melanoma is often a change in a mole or a new dark spot.  The ABCDE system is a way of remembering the signs of melanoma.  A for asymmetry:  The two halves do not match. B for border:  The edges of the growth are irregular. C for color:  A mixture of colors are present instead of an even brown color. D for diameter:  Melanomas are usually (but not always) greater than 6mm - the size of a pencil eraser. E  for evolution:  The spot keeps changing in size, shape, and color.  Please check your skin once per month between visits. You can use a small mirror in front and a large mirror behind you to keep an eye on the back side or your body.   If you see any new or changing lesions before your next follow-up, please call to schedule a visit.  Please continue daily skin protection including broad spectrum sunscreen SPF 30+ to sun-exposed areas, reapplying every 2 hours as needed when you're outdoors.   Staying in the shade or wearing long sleeves, sun glasses (UVA+UVB protection) and wide brim hats (4-inch brim around the entire circumference of the hat) are also recommended for sun protection.      Due to recent changes in healthcare laws, you may see results of your pathology and/or laboratory studies on MyChart before the doctors have had a chance to review them. We understand that in some cases there may be results that are confusing or concerning to you. Please understand that not all results are received at the same time and often the doctors may need to interpret multiple results in order to provide you with the best plan of care or course of treatment. Therefore, we ask that you please give us  2 business days to thoroughly review all your results before contacting the office for clarification. Should we see a critical lab result, you will be contacted sooner.   If You Need Anything After Your Visit  If  you have any questions or concerns for your doctor, please call our main line at [phone] and press option 4 to reach your doctor's medical assistant. If no one answers, please leave a voicemail as directed and we will return your call as soon as possible. Messages left after 4 pm will be answered the following business day.   You may also send us  a message via MyChart. We typically respond to MyChart messages within 1-2 business days.  For prescription refills, please ask your pharmacy to  contact our office. Our fax number is [phone].  If you have an urgent issue when the clinic is closed that cannot wait until the next business day, you can page your doctor at the number below.    Please note that while we do our best to be available for urgent issues outside of office hours, we are not available 24/7.   If you have an urgent issue and are unable to reach us , you may choose to seek medical care at your doctor's office, retail clinic, urgent care center, or emergency room.  If you have a medical emergency, please immediately call 911 or go to the emergency department.  Pager Numbers  - Dr. Hester: [phone]  - Dr. Jackquline: [phone]  - Dr. Claudene: [phone]   - Dr. Raymund: [phone]  In the event of inclement weather, please call our main line at [phone] for an update on the status of any delays or closures.  Dermatology Medication Tips: Please keep the boxes that topical medications come in in order to help keep track of the instructions about where and how to use these. Pharmacies typically print the medication instructions only on the boxes and not directly on the medication tubes.   If your medication is too expensive, please contact our office at [phone] option 4 or send us  a message through MyChart.   We are unable to tell what your co-pay for medications will be in advance as this is different depending on your insurance coverage. However, we may be able to find a substitute medication at lower cost or fill out paperwork to get insurance to cover a needed medication.   If a prior authorization is required to get your medication covered by your insurance company, please allow us  1-2 business days to complete this process.  Drug prices often vary depending on where the prescription is filled and some pharmacies may offer cheaper prices.  The website www.goodrx.com contains coupons for medications through different pharmacies. The prices  here do not account for what the cost may be with help from insurance (it may be cheaper with your insurance), but the website can give you the price if you did not use any insurance.  - You can print the associated coupon and take it with your prescription to the pharmacy.  - You may also stop by our office during regular business hours and pick up a GoodRx coupon card.  - If you need your prescription sent electronically to a different pharmacy, notify our office through [facility] or by phone at [phone] option 4.     Si Usted Necesita Algo Despus de Su Visita  Tambin puede enviarnos un mensaje a travs de Clinical cytogeneticist. Por lo general respondemos a los mensajes de MyChart en el transcurso de 1 a 2 das hbiles.  Para renovar recetas, por favor pida a su farmacia que se ponga en contacto con nuestra oficina. Randi lakes de fax es [place] [phone].  Si tiene un asunto  urgente cuando la clnica est cerrada y que no puede esperar hasta el siguiente da hbil, puede llamar/localizar a su doctor(a) al nmero que aparece a continuacin.   Por favor, tenga en cuenta que aunque hacemos todo lo posible para estar disponibles para asuntos urgentes fuera del horario de [place], no estamos disponibles las 24 horas del da, los 7 [address]  [address] de la [place].   Si tiene un problema urgente y no puede comunicarse con nosotros, puede optar por buscar atencin mdica  en el consultorio de su doctor(a), en una clnica privada, en un centro de atencin urgente o en una sala de emergencias.  Si tiene Engineer, drilling, por favor llame inmediatamente al 911 o vaya a la sala de emergencias.  Nmeros de bper  - Dr. Hester: [phone]  - Dra. Jackquline: [phone]  - Dr. Claudene: [phone]  - Dra. Kitts: [phone]  En caso de inclemencias del [place], por favor llame a nuestra lnea principal al [phone] para una actualizacin sobre el estado de cualquier retraso o cierre.  Consejos  para la medicacin en dermatologa: Por favor, guarde las cajas en las que vienen los medicamentos de uso tpico para ayudarle a seguir las instrucciones sobre dnde y cmo usarlos. Las farmacias generalmente imprimen las instrucciones del medicamento slo en las cajas y no directamente en los tubos del [place].   Si su medicamento es muy caro, por favor, pngase en contacto con landry rieger llamando al [phone] y presione la opcin 4 o envenos un mensaje a travs de Clinical cytogeneticist.   No podemos decirle cul ser su copago por los medicamentos por adelantado ya que esto es diferente dependiendo de la cobertura de su seguro. Sin embargo, es posible que podamos encontrar un medicamento sustituto a Audiological scientist un formulario para que el seguro cubra el medicamento que se considera necesario.   Si se requiere una autorizacin previa para que su compaa de seguros malta su medicamento, por favor permtanos de 1 a 2 das hbiles para completar este proceso.  Los precios de los medicamentos varan con frecuencia dependiendo del Environmental consultant de dnde se surte la receta y alguna farmacias pueden ofrecer precios ms baratos.  El sitio web www.goodrx.com tiene cupones para medicamentos de Health and safety inspector. Los precios aqu no tienen en cuenta lo que podra costar con la ayuda del seguro (puede ser ms barato con su seguro), pero el sitio web puede darle el precio si no utiliz Tourist information centre manager.  - Puede imprimir el cupn correspondiente y llevarlo con su receta a la farmacia.  - Tambin puede pasar por nuestra oficina durante el horario de atencin regular y Education officer, museum una tarjeta de cupones de GoodRx.  - Si necesita que su receta se enve electrnicamente a una farmacia diferente, informe a nuestra oficina a travs de MyChart de [place] o por telfono llamando al [phone] y presione la opcin 4.

## 2023-11-20 DIAGNOSIS — H353131 Nonexudative age-related macular degeneration, bilateral, early dry stage: Secondary | ICD-10-CM | POA: Diagnosis not present

## 2023-11-21 ENCOUNTER — Ambulatory Visit: Admitting: Dermatology

## 2023-11-21 DIAGNOSIS — L570 Actinic keratosis: Secondary | ICD-10-CM

## 2023-11-21 DIAGNOSIS — W908XXA Exposure to other nonionizing radiation, initial encounter: Secondary | ICD-10-CM

## 2023-11-21 NOTE — Progress Notes (Signed)
   Follow-Up Visit   Subjective  Ashley Contreras is a 63 y.o. female who presents for the following: Area of concern on her left brow. Discussed cryotherapy at her last appointment but she had an event to attend after; she would like area treated today.    The following portions of the chart were reviewed this encounter and updated as appropriate: medications, allergies, medical history  Review of Systems:  No other skin or systemic complaints except as noted in HPI or Assessment and Plan.  Objective  Well appearing patient in no apparent distress; mood and affect are within normal limits.   A focused examination was performed of the following areas: Face  Relevant exam findings are noted in the Assessment and Plan.  Left Eyebrow Pink scaly macule   Assessment & Plan   ACTINIC KERATOSIS Left Eyebrow Actinic keratoses are precancerous spots that appear secondary to cumulative UV radiation exposure/sun exposure over time. They are chronic with expected duration over 1 year. A portion of actinic keratoses will progress to squamous cell carcinoma of the skin. It is not possible to reliably predict which spots will progress to skin cancer and so treatment is recommended to prevent development of skin cancer.  Recommend daily broad spectrum sunscreen SPF 30+ to sun-exposed areas, reapply every 2 hours as needed.  Recommend staying in the shade or wearing long sleeves, sun glasses (UVA+UVB protection) and wide brim hats (4-inch brim around the entire circumference of the hat). Call for new or changing lesions. Destruction of lesion - Left Eyebrow  Destruction method: cryotherapy   Informed consent: discussed and consent obtained   Lesion destroyed using liquid nitrogen: Yes   Region frozen until ice ball extended beyond lesion: Yes   Outcome: patient tolerated procedure well with no complications   Post-procedure details: wound care instructions given   Additional details:  Prior to  procedure, discussed risks of blister formation, small wound, skin dyspigmentation, or rare scar following cryotherapy. Recommend Vaseline ointment to treated areas while healing.     Return for Appointment as scheduled.  I, Emerick Ege, CMA am acting as scribe for Rexene Rattler, MD.   Documentation: I have reviewed the above documentation for accuracy and completeness, and I agree with the above.  Rexene Rattler, MD

## 2023-11-21 NOTE — Progress Notes (Deleted)
   Follow-Up Visit   Subjective  Ashley Contreras is a 63 y.o. female who presents for the following: Place of concern at face   The following portions of the chart were reviewed this encounter and updated as appropriate: medications, allergies, medical history  Review of Systems:  No other skin or systemic complaints except as noted in HPI or Assessment and Plan.  Objective  Well appearing patient in no apparent distress; mood and affect are within normal limits.  A focused examination was performed of the following areas: Face  Relevant exam findings are noted in the Assessment and Plan.    Assessment & Plan       Return for As scheduled, w/ Dr. Jackquline.  I, Adreanne Yono V. Wilfred, CMA, am acting as scribe for Rexene Jackquline, MD .   Documentation: I have reviewed the above documentation for accuracy and completeness, and I agree with the above.  Rexene Jackquline, MD

## 2023-11-21 NOTE — Patient Instructions (Addendum)

## 2023-12-19 ENCOUNTER — Other Ambulatory Visit: Payer: Self-pay

## 2023-12-19 DIAGNOSIS — H2513 Age-related nuclear cataract, bilateral: Secondary | ICD-10-CM | POA: Diagnosis not present

## 2023-12-19 DIAGNOSIS — H04123 Dry eye syndrome of bilateral lacrimal glands: Secondary | ICD-10-CM | POA: Diagnosis not present

## 2023-12-19 DIAGNOSIS — H353132 Nonexudative age-related macular degeneration, bilateral, intermediate dry stage: Secondary | ICD-10-CM | POA: Diagnosis not present

## 2023-12-19 MED ORDER — XIIDRA 5 % OP SOLN
1.0000 [drp] | Freq: Two times a day (BID) | OPHTHALMIC | 6 refills | Status: AC
  Filled 2023-12-19: qty 60, 30d supply, fill #0
  Filled 2024-02-19: qty 60, 30d supply, fill #1
  Filled 2024-03-21: qty 180, 90d supply, fill #2

## 2023-12-21 ENCOUNTER — Other Ambulatory Visit: Payer: Self-pay

## 2023-12-21 ENCOUNTER — Other Ambulatory Visit: Payer: Self-pay | Admitting: Obstetrics and Gynecology

## 2023-12-21 DIAGNOSIS — Z79890 Hormone replacement therapy (postmenopausal): Secondary | ICD-10-CM

## 2023-12-21 DIAGNOSIS — N951 Menopausal and female climacteric states: Secondary | ICD-10-CM

## 2023-12-21 MED ORDER — ESTRADIOL 0.0375 MG/24HR TD PTTW
1.0000 | MEDICATED_PATCH | TRANSDERMAL | 0 refills | Status: DC
Start: 2023-12-21 — End: 2024-01-08
  Filled 2023-12-21: qty 24, 84d supply, fill #0

## 2023-12-22 ENCOUNTER — Other Ambulatory Visit: Payer: Self-pay

## 2023-12-25 ENCOUNTER — Other Ambulatory Visit: Payer: Self-pay

## 2023-12-25 MED ORDER — FLUTICASONE PROPIONATE 50 MCG/ACT NA SUSP
2.0000 | Freq: Every day | NASAL | 1 refills | Status: AC
  Filled 2023-12-25: qty 48, 90d supply, fill #0

## 2024-01-07 NOTE — Progress Notes (Unsigned)
 PCP: Bertrum Charlie CROME, MD   No chief complaint on file.   HPI:      [name] is a 63 y.o. No obstetric history on file. who LMP was No LMP recorded. Patient has had a hysterectomy., presents today for her annual examination.  Her menses are absent due to supracervical hyst BSO for menorrhagia/endometriosis 2008. No PMB. She doesn't have vasomotor sx on vivelle -dot 0.0375 mg. Doing well and wants to continue.   Sex activity: single partner, contraception - status post hysterectomy. No vaginal pain/dryness/bleeding.   Last Pap: 12/23/21 Results were: no abnormalities /neg HPV DNA. Still has cx. Hx of STDs: none  Last mammogram: 08/01/23  Results were: normal--routine follow-up in 12 months There is a FH of breast cancer in her mom and 2 mat aunts. Pt is MyRisk neg except ATM VUS 9/20. IBIS=24.7%/riskscore=15.8%. There is no FH of ovarian cancer. The patient does do self-breast exams. Has not had screening breast MRI, declines this yr but may be interested next year.  Colonoscopy: 12/18 without abnormalities with Dr. Jinny;  Repeat due after 10 years per pt (hx of polyps in past). Pt has a hx of ext hemorrhoid, sometimes with flare, treats with analpram. Prefers supp to cream but not on formulary. Would like Rx for anusol  supp this yr. Does sitz baths as well with sx flare. Hx of IBS, diarrhea sx cause sx flare.  DEXA: normal DEXA in spine and hip [facility] 2016.   Tobacco use: The patient denies current or previous tobacco use. Alcohol  use: none  No drug use Exercise: min active  She does get adequate calcium and Vitamin D  in her diet. Hx of Vit D deficiency. Labs with PCP   Past Medical History:  Diagnosis Date   Abnormal Pap smear 1989   s/p colposcopy   Allergy    hay fever   Arthritis    Asthma    BRCA negative 10/2018   MyRisk neg except ATM VUS   Chicken pox    Chronically dry eyes 2021   Colon polyp    Complication of anesthesia    Family history of  adverse reaction to anesthesia    sister - slow to wake   Family history of breast cancer    Fatty liver    seen by hepatology   GERD (gastroesophageal reflux disease)    Increased risk of breast cancer 10/2018   IBIS=24.7%/riskscore=15.8%   Mitral valve prolapse    PONV (postoperative nausea and vomiting)    Rosacea 10/2020   Shingles    waist, right side   UTI (lower urinary tract infection)    Wears contact lenses     Past Surgical History:  Procedure Laterality Date   ABDOMINAL HYSTERECTOMY  2008   BREAST BIOPSY Left    benign   calcaneous osteotomy  06/2011   COLONOSCOPY WITH PROPOFOL  N/A 02/17/2017   Procedure: COLONOSCOPY WITH PROPOFOL ;  Surgeon: Jinny Carmine, MD;  Location: [facility];  Service: Endoscopy;  Laterality: N/A;   ENDOSCOPIC RETROGRADE CHOLANGIOPANCREATOGRAPHY (ERCP) WITH PROPOFOL  N/A 05/17/2020   Procedure: ENDOSCOPIC RETROGRADE CHOLANGIOPANCREATOGRAPHY (ERCP) WITH PROPOFOL ;  Surgeon: Jinny Carmine, MD;  Location: [facility];  Service: Endoscopy;  Laterality: N/A;   ERCP N/A 06/18/2020   Procedure: ENDOSCOPIC RETROGRADE CHOLANGIOPANCREATOGRAPHY (ERCP);  Surgeon: Jinny Carmine, MD;  Location: [facility];  Service: Endoscopy;  Laterality: N/A;   ESOPHAGOGASTRODUODENOSCOPY (EGD) WITH PROPOFOL  N/A 02/17/2017   Procedure: ESOPHAGOGASTRODUODENOSCOPY (EGD) WITH PROPOFOL ;  Surgeon: Jinny Carmine, MD;  Location: [facility];  Service: Endoscopy;  Laterality: N/A;   foot and ankle surgery     OOPHORECTOMY     TONSILLECTOMY AND ADENOIDECTOMY  2008    Family History  Problem Relation Age of Onset   Arthritis Mother    Hypertension Mother    Mental illness Mother    Diabetes Mother    Breast cancer Mother 56       right breast   Hyperlipidemia Father    Heart disease Father    Cancer Father        lung   Mental illness Sister    Myasthenia gravis Sister    Multiple sclerosis Sister    Stroke Maternal Aunt    Breast cancer Maternal Aunt  70       right breast   Breast cancer Maternal Aunt 72       right breast   Stroke Maternal Grandmother    Hypertension Maternal Grandmother    Diabetes Maternal Grandmother    Breast cancer Other    Multiple sclerosis Other     Social History   Socioeconomic History   Marital status: Married    Spouse name: Not on file   Number of children: 2   Years of education: Not on file   Highest education level: Not on file  Occupational History   Occupation: Futures Trader    Employer: [employer]  Tobacco Use   Smoking status: Never   Smokeless tobacco: Never  Vaping Use   Vaping status: Never Used  Substance and Sexual Activity   Alcohol  use: No   Drug use: No   Sexual activity: Yes    Birth control/protection: Surgical    Comment: Hysterectomy  Other Topics Concern   Not on file  Social History Narrative   Lives in [place], 2 children. No pets.      Work - [employer]      Diet - regular, healthy   Exercise -bike sometimes, limited by injury   Social Drivers of [employer]: Low Risk  (07/18/2023)   Received [facility]   Overall Financial Resource Strain (CARDIA)    Difficulty of Paying Living Expenses: Not hard at all  Food Insecurity: No Food Insecurity (07/18/2023)   Received from [facility]   Hunger Vital Sign    Within the past 12 months, you worried that your food would run out before you got the money to buy more.: Never true    Within the past 12 months, the food you bought just didn't last and you didn't have money to get more.: Never true  Transportation Needs: No Transportation Needs (07/18/2023)   Received from [facility] - Transportation    In the past 12 months, has lack of transportation kept you from medical appointments or from getting medications?: No    Lack of Transportation (Non-Medical): No  Physical Activity: Not on file  Stress: Not on file   Social Connections: Not on file  Intimate Partner Violence: Not on file     Current Outpatient Medications:    benzonatate  (TESSALON ) 100 MG capsule, Take 1 capsule (100 mg total) by mouth 3 (three) times daily as needed for cough., Disp: 21 capsule, Rfl: 0   calcium-vitamin D  (OSCAL WITH D) 500-200 MG-UNIT TABS tablet, Take by mouth., Disp: , Rfl:    doxycycline  (PERIOSTAT ) 20 MG tablet, Take 2 tablets (40 mg total) by mouth  daily., Disp: 60 tablet, Rfl: 5   doxycycline  (VIBRAMYCIN ) 100 MG capsule, Take 1 capsule (100 mg total) by mouth 2 (two) times daily., Disp: 14 capsule, Rfl: 0   estradiol  (VIVELLE -DOT) 0.0375 MG/24HR, Place 1 patch onto the skin 2 (two) times a week., Disp: 24 patch, Rfl: 0   Fexofenadine HCl (ALLEGRA PO), Take by mouth., Disp: , Rfl:    FINAPOD 0.1-7 % SOLN, Apply topically 2 (two) times daily., Disp: , Rfl:    fluticasone  (FLONASE ) 50 MCG/ACT nasal spray, Place 2 sprays into both nostrils daily., Disp: 48 g, Rfl: 1   Lifitegrast  (XIIDRA ) 5 % SOLN, PLACE 1 DROP INTO BOTH EYES TWICE A DAY, Disp: 60 each, Rfl: 6   Lifitegrast  (XIIDRA ) 5 % SOLN, Place 1 drop into both eyes 2 (two) times daily., Disp: 60 each, Rfl: 6   metroNIDAZOLE  (METROCREAM ) 0.75 % cream, Apply to face once or twice daily for rosacea, Disp: 45 g, Rfl: 5   mometasone  (ELOCON ) 0.1 % ointment, Apply twice daily as needed for insect bites., Disp: 45 g, Rfl: 0   Multiple Vitamins-Minerals (PRESERVISION AREDS 2 PO), Take by mouth 2 (two) times daily., Disp: , Rfl:    pantoprazole  (PROTONIX ) 40 MG tablet, Take 1 tablet (40 mg total) by mouth daily., Disp: 90 tablet, Rfl: 3   pantoprazole  (PROTONIX ) 40 MG tablet, Take 1 tablet (40 mg total) by mouth daily., Disp: 90 tablet, Rfl: 3   sucralfate  (CARAFATE ) 1 g tablet, Take 1 tablet (1 g total) by mouth 4 (four) times daily -  with meals and at bedtime., Disp: 270 tablet, Rfl: 3   sucralfate  (CARAFATE ) 1 g tablet, Take 1 tablet (1 g total) by mouth 4 (four)  times daily., Disp: 270 tablet, Rfl: 3   Tavaborole  (KERYDIN ) 5 % SOLN, Apply to affected toenail nightly until improved., Disp: 10 mL, Rfl: 5   tiZANidine  (ZANAFLEX ) 4 MG tablet, Take 1 tablet (4 mg total) by mouth 3 (three) times daily as needed., Disp: 30 tablet, Rfl: 1   triamcinolone  (KENALOG ) 0.025 % cream, Apply 1 Application topically 2 (two) times daily., Disp: 30 g, Rfl: 0     ROS:  Review of Systems  Constitutional:  Negative for fatigue, fever and unexpected weight change.  Respiratory:  Negative for cough, shortness of breath and wheezing.   Cardiovascular:  Negative for chest pain, palpitations and leg swelling.  Gastrointestinal:  Negative for blood in stool, constipation, diarrhea, nausea and vomiting.  Endocrine: Negative for cold intolerance, heat intolerance and polyuria.  Genitourinary:  Negative for dyspareunia, dysuria, flank pain, frequency, genital sores, hematuria, menstrual problem, pelvic pain, urgency, vaginal bleeding, vaginal discharge and vaginal pain.  Musculoskeletal:  Negative for back pain, joint swelling and myalgias.  Skin:  Negative for rash.  Neurological:  Negative for dizziness, syncope, light-headedness, numbness and headaches.  Hematological:  Negative for adenopathy.  Psychiatric/Behavioral:  Negative for agitation, confusion, sleep disturbance and suicidal ideas. The patient is not nervous/anxious.   BREAST: No symptoms    Objective: There were no vitals taken for this visit.   Physical Exam Constitutional:      Appearance: She is well-developed.  Genitourinary:     Vulva normal.     Genitourinary Comments: UTERUS SURG REM; EXT HEM TAG ON EXAM TODAY     Right Labia: No rash, tenderness or lesions.    Left Labia: No tenderness, lesions or rash.    Vaginal cuff intact.    No vaginal discharge, erythema or tenderness.  Right Adnexa: not tender and no mass present.    Left Adnexa: not tender and no mass present.    Cervix is not  absent.     No cervical polyp.     Uterus is absent.  Rectum:     External hemorrhoid present.  Breasts:    Right: No mass, nipple discharge, skin change or tenderness.     Left: No mass, nipple discharge, skin change or tenderness.  Neck:     Thyroid : No thyromegaly.  Cardiovascular:     Rate and Rhythm: Normal rate and regular rhythm.     Heart sounds: Normal heart sounds. No murmur heard. Pulmonary:     Effort: Pulmonary effort is normal.     Breath sounds: Normal breath sounds.  Abdominal:     Palpations: Abdomen is soft.     Tenderness: There is no abdominal tenderness. There is no guarding.  Musculoskeletal:        General: Normal range of motion.     Cervical back: Normal range of motion.  Neurological:     General: No focal deficit present.     Mental Status: She is alert and oriented to person, place, and time.     Cranial Nerves: No cranial nerve deficit.  Skin:    General: Skin is warm and dry.  Psychiatric:        Mood and Affect: Mood normal.        Behavior: Behavior normal.        Thought Content: Thought content normal.        Judgment: Judgment normal.  Vitals reviewed.     Assessment/Plan: Encounter for annual routine gynecological examination  Encounter for screening mammogram for malignant neoplasm of breast - Plan: MM 3D SCREENING MAMMOGRAM BILATERAL BREAST; pt to schedule mammo 6/25  Family history of breast cancer - Plan: MM 3D SCREENING MAMMOGRAM BILATERAL BREAST; pt is MyRisk neg  Increased risk of breast cancer - Plan: MM 3D SCREENING MAMMOGRAM BILATERAL BREAST; aware of recommendations of monthly SBE, yearly CBE and mammos, as well as scr breast MRI. Will call for MRI ref prn.   Hormone replacement therapy (HRT) - Plan: estradiol  (VIVELLE -DOT) 0.0375 MG/24HR  Vasomotor symptoms due to menopause - Plan: estradiol  (VIVELLE -DOT) 0.0375 MG/24HR; doing well, Rx RF eRxd.   Grade I hemorrhoids - Plan: hydrocortisone  (ANUSOL -HC) 25 MG suppository;  no sx today, ext hem tag today. Rx anusol  supp prn/sitz baths. F/u prn.    No orders of the defined types were placed in this encounter.         GYN counsel breast self exam, mammography screening, adequate intake of calcium and vitamin D , diet and exercise    F/U  No follow-ups on file.  Kamilo Och B. Zymier Rodgers, PA-C 01/07/2024 5:23 PM

## 2024-01-08 ENCOUNTER — Other Ambulatory Visit: Payer: Self-pay

## 2024-01-08 ENCOUNTER — Ambulatory Visit (INDEPENDENT_AMBULATORY_CARE_PROVIDER_SITE_OTHER): Admitting: Obstetrics and Gynecology

## 2024-01-08 ENCOUNTER — Encounter: Payer: Self-pay | Admitting: Obstetrics and Gynecology

## 2024-01-08 VITALS — BP 104/68 | HR 89 | Ht 66.0 in | Wt 133.0 lb

## 2024-01-08 DIAGNOSIS — Z9189 Other specified personal risk factors, not elsewhere classified: Secondary | ICD-10-CM

## 2024-01-08 DIAGNOSIS — Z803 Family history of malignant neoplasm of breast: Secondary | ICD-10-CM | POA: Diagnosis not present

## 2024-01-08 DIAGNOSIS — K640 First degree hemorrhoids: Secondary | ICD-10-CM

## 2024-01-08 DIAGNOSIS — Z1329 Encounter for screening for other suspected endocrine disorder: Secondary | ICD-10-CM

## 2024-01-08 DIAGNOSIS — Z90710 Acquired absence of both cervix and uterus: Secondary | ICD-10-CM | POA: Diagnosis not present

## 2024-01-08 DIAGNOSIS — N951 Menopausal and female climacteric states: Secondary | ICD-10-CM | POA: Diagnosis not present

## 2024-01-08 DIAGNOSIS — Z1239 Encounter for other screening for malignant neoplasm of breast: Secondary | ICD-10-CM

## 2024-01-08 DIAGNOSIS — Z79890 Hormone replacement therapy (postmenopausal): Secondary | ICD-10-CM

## 2024-01-08 DIAGNOSIS — Z01419 Encounter for gynecological examination (general) (routine) without abnormal findings: Secondary | ICD-10-CM

## 2024-01-08 DIAGNOSIS — Z01411 Encounter for gynecological examination (general) (routine) with abnormal findings: Secondary | ICD-10-CM | POA: Diagnosis not present

## 2024-01-08 DIAGNOSIS — Z1231 Encounter for screening mammogram for malignant neoplasm of breast: Secondary | ICD-10-CM

## 2024-01-08 MED ORDER — ESTRADIOL 0.0375 MG/24HR TD PTTW
1.0000 | MEDICATED_PATCH | TRANSDERMAL | 3 refills | Status: AC
  Filled 2024-01-08: qty 24, 84d supply, fill #0
  Filled 2024-03-21: qty 8, 28d supply, fill #0
  Filled 2024-03-21: qty 16, 56d supply, fill #0
  Filled 2024-03-21 (×2): qty 24, 84d supply, fill #0

## 2024-01-08 MED ORDER — HYDROCORTISONE ACETATE 25 MG RE SUPP
25.0000 mg | Freq: Two times a day (BID) | RECTAL | 2 refills | Status: AC
Start: 2024-01-08 — End: 2024-01-17
  Filled 2024-01-08: qty 14, 7d supply, fill #0

## 2024-01-08 NOTE — Patient Instructions (Signed)
 I value your feedback and you entrusting [place] with your care. If you get a [place] patient survey, I would appreciate you taking the time to let us know about your experience today. Thank you! ? ? ?

## 2024-01-11 DIAGNOSIS — H43811 Vitreous degeneration, right eye: Secondary | ICD-10-CM | POA: Diagnosis not present

## 2024-01-16 ENCOUNTER — Other Ambulatory Visit

## 2024-01-16 DIAGNOSIS — Z1329 Encounter for screening for other suspected endocrine disorder: Secondary | ICD-10-CM

## 2024-01-17 LAB — TSH+FREE T4
Free T4: 1.12 ng/dL (ref 0.82–1.77)
TSH: 0.58 u[IU]/mL (ref 0.450–4.500)

## 2024-01-21 ENCOUNTER — Ambulatory Visit: Payer: Self-pay | Admitting: Obstetrics and Gynecology

## 2024-01-23 ENCOUNTER — Other Ambulatory Visit: Payer: Self-pay

## 2024-01-24 ENCOUNTER — Other Ambulatory Visit: Payer: Self-pay

## 2024-01-24 MED ORDER — VALACYCLOVIR HCL 1 G PO TABS
ORAL_TABLET | ORAL | 1 refills | Status: AC
  Filled 2024-01-24: qty 30, 7d supply, fill #0

## 2024-01-31 ENCOUNTER — Encounter: Payer: Self-pay | Admitting: Obstetrics and Gynecology

## 2024-02-17 ENCOUNTER — Ambulatory Visit
Admission: RE | Admit: 2024-02-17 | Discharge: 2024-02-17 | Disposition: A | Source: Ambulatory Visit | Attending: Obstetrics and Gynecology

## 2024-02-17 DIAGNOSIS — Z1239 Encounter for other screening for malignant neoplasm of breast: Secondary | ICD-10-CM | POA: Diagnosis not present

## 2024-02-17 DIAGNOSIS — Z803 Family history of malignant neoplasm of breast: Secondary | ICD-10-CM

## 2024-02-17 DIAGNOSIS — Z9189 Other specified personal risk factors, not elsewhere classified: Secondary | ICD-10-CM

## 2024-02-17 MED ORDER — GADOPICLENOL 0.5 MMOL/ML IV SOLN
6.0000 mL | Freq: Once | INTRAVENOUS | Status: AC | PRN
  Administered 2024-02-17: 6 mL via INTRAVENOUS

## 2024-02-19 ENCOUNTER — Encounter: Payer: Self-pay | Admitting: Obstetrics and Gynecology

## 2024-02-19 DIAGNOSIS — R928 Other abnormal and inconclusive findings on diagnostic imaging of breast: Secondary | ICD-10-CM

## 2024-02-19 DIAGNOSIS — N6002 Solitary cyst of left breast: Secondary | ICD-10-CM

## 2024-02-23 ENCOUNTER — Other Ambulatory Visit (HOSPITAL_COMMUNITY): Payer: Self-pay

## 2024-02-23 ENCOUNTER — Other Ambulatory Visit: Payer: Self-pay

## 2024-02-23 MED ORDER — FLUTICASONE PROPIONATE 50 MCG/ACT NA SUSP: 2.0000 | Freq: Every day | NASAL | 3 refills | Status: AC

## 2024-02-23 MED ORDER — PANTOPRAZOLE SODIUM 40 MG PO TBEC: 40.0000 mg | DELAYED_RELEASE_TABLET | Freq: Every day | ORAL | 3 refills | Status: AC

## 2024-02-23 MED ORDER — VALACYCLOVIR HCL 1 G PO TABS: 1000.0000 mg | ORAL_TABLET | Freq: Every day | ORAL | 3 refills | Status: AC

## 2024-02-27 ENCOUNTER — Other Ambulatory Visit (HOSPITAL_COMMUNITY): Payer: Self-pay

## 2024-02-29 ENCOUNTER — Other Ambulatory Visit: Payer: Self-pay

## 2024-02-29 MED ORDER — MINOXIDIL 2.5 MG PO TABS
1.2500 mg | ORAL_TABLET | Freq: Every day | ORAL | 1 refills | Status: AC
  Filled 2024-02-29: qty 45, 90d supply, fill #0

## 2024-03-01 ENCOUNTER — Other Ambulatory Visit: Payer: Self-pay

## 2024-03-01 ENCOUNTER — Other Ambulatory Visit (HOSPITAL_COMMUNITY): Payer: Self-pay

## 2024-03-06 ENCOUNTER — Inpatient Hospital Stay: Admission: RE | Admit: 2024-03-06 | Discharge: 2024-03-06 | Attending: Obstetrics and Gynecology

## 2024-03-06 ENCOUNTER — Ambulatory Visit
Admission: RE | Admit: 2024-03-06 | Discharge: 2024-03-06 | Disposition: A | Discharge status: Home / Self Care | Source: Ambulatory Visit | Attending: Obstetrics and Gynecology | Admitting: Obstetrics and Gynecology

## 2024-03-06 DIAGNOSIS — R928 Other abnormal and inconclusive findings on diagnostic imaging of breast: Secondary | ICD-10-CM | POA: Diagnosis present

## 2024-03-06 DIAGNOSIS — N6002 Solitary cyst of left breast: Secondary | ICD-10-CM | POA: Insufficient documentation

## 2024-03-07 ENCOUNTER — Ambulatory Visit: Payer: Self-pay | Admitting: Obstetrics and Gynecology

## 2024-03-21 ENCOUNTER — Other Ambulatory Visit: Payer: Self-pay

## 2024-03-21 ENCOUNTER — Other Ambulatory Visit (HOSPITAL_COMMUNITY): Payer: Self-pay

## 2024-03-22 ENCOUNTER — Other Ambulatory Visit: Payer: Self-pay

## 2024-11-12 ENCOUNTER — Encounter: Admitting: Dermatology
